# Patient Record
Sex: Male | Born: 1935 | Race: White | Hispanic: No | Smoking: Never smoker
Health system: Southern US, Community
[De-identification: ages and names within clinical notes are randomized; demographics above are authoritative.]

## PROBLEM LIST (undated history)

## (undated) DIAGNOSIS — F329 Major depressive disorder, single episode, unspecified: Secondary | ICD-10-CM

## (undated) DIAGNOSIS — I2699 Other pulmonary embolism without acute cor pulmonale: Secondary | ICD-10-CM

## (undated) DIAGNOSIS — G2581 Restless legs syndrome: Secondary | ICD-10-CM

## (undated) DIAGNOSIS — N400 Enlarged prostate without lower urinary tract symptoms: Secondary | ICD-10-CM

## (undated) DIAGNOSIS — G4733 Obstructive sleep apnea (adult) (pediatric): Secondary | ICD-10-CM

## (undated) DIAGNOSIS — S060X9A Concussion with loss of consciousness of unspecified duration, initial encounter: Secondary | ICD-10-CM

## (undated) DIAGNOSIS — F419 Anxiety disorder, unspecified: Secondary | ICD-10-CM

## (undated) DIAGNOSIS — K5792 Diverticulitis of intestine, part unspecified, without perforation or abscess without bleeding: Secondary | ICD-10-CM

## (undated) DIAGNOSIS — K219 Gastro-esophageal reflux disease without esophagitis: Secondary | ICD-10-CM

## (undated) DIAGNOSIS — R339 Retention of urine, unspecified: Secondary | ICD-10-CM

## (undated) DIAGNOSIS — J40 Bronchitis, not specified as acute or chronic: Secondary | ICD-10-CM

## (undated) DIAGNOSIS — I10 Essential (primary) hypertension: Secondary | ICD-10-CM

## (undated) DIAGNOSIS — N289 Disorder of kidney and ureter, unspecified: Secondary | ICD-10-CM

## (undated) DIAGNOSIS — G4730 Sleep apnea, unspecified: Secondary | ICD-10-CM

## (undated) DIAGNOSIS — F411 Generalized anxiety disorder: Secondary | ICD-10-CM

## (undated) DIAGNOSIS — J189 Pneumonia, unspecified organism: Secondary | ICD-10-CM

## (undated) HISTORY — PX: CERVICAL FUSION: SHX112

## (undated) HISTORY — DX: Major depressive disorder, single episode, unspecified: F32.9

## (undated) HISTORY — PX: TONSILLECTOMY: SUR1361

## (undated) HISTORY — PX: KNEE ARTHROSCOPY: SHX127

## (undated) HISTORY — PX: BICEPS TENDON REPAIR: SHX566

## (undated) HISTORY — DX: Anxiety disorder, unspecified: F41.9

## (undated) HISTORY — PX: ROTATOR CUFF REPAIR: SHX139

## (undated) HISTORY — DX: Other pulmonary embolism without acute cor pulmonale: I26.99

## (undated) HISTORY — DX: Obstructive sleep apnea (adult) (pediatric): G47.33

## (undated) HISTORY — PX: INGUINAL HERNIA REPAIR: SUR1180

## (undated) HISTORY — PX: CARPAL TUNNEL RELEASE: SHX101

## (undated) HISTORY — DX: Generalized anxiety disorder: F41.1

## (undated) HISTORY — DX: Restless legs syndrome: G25.81

## (undated) HISTORY — PX: EAR MASTOIDECTOMY W/ COCHLEAR IMPLANT W/ LANDMARK: SHX1483

## (undated) HISTORY — PX: CHOLECYSTECTOMY: SHX55

## (undated) HISTORY — PX: OTHER SURGICAL HISTORY: SHX169

---
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
## 1983-08-20 HISTORY — PX: NASAL FRACTURE SURGERY: SHX718

## 1990-08-19 HISTORY — PX: CARPAL TUNNEL RELEASE: SHX101

## 1995-08-20 HISTORY — PX: INGUINAL HERNIA REPAIR: SUR1180

## 2004-02-17 HISTORY — PX: KNEE ARTHROSCOPY: SHX127

## 2010-07-19 HISTORY — PX: BICEPS TENDON REPAIR: SHX566

## 2011-02-17 HISTORY — PX: ROTATOR CUFF REPAIR: SHX139

## 2013-05-21 DIAGNOSIS — H04123 Dry eye syndrome of bilateral lacrimal glands: Secondary | ICD-10-CM | POA: Insufficient documentation

## 2015-07-14 DIAGNOSIS — S060X9A Concussion with loss of consciousness of unspecified duration, initial encounter: Secondary | ICD-10-CM

## 2015-07-14 DIAGNOSIS — S060XAA Concussion with loss of consciousness status unknown, initial encounter: Secondary | ICD-10-CM

## 2015-07-14 HISTORY — DX: Concussion with loss of consciousness of unspecified duration, initial encounter: S06.0X9A

## 2015-07-14 HISTORY — DX: Concussion with loss of consciousness status unknown, initial encounter: S06.0XAA

## 2015-11-12 ENCOUNTER — Emergency Department (HOSPITAL_BASED_OUTPATIENT_CLINIC_OR_DEPARTMENT_OTHER): Payer: Medicare Other

## 2015-11-12 ENCOUNTER — Emergency Department (HOSPITAL_BASED_OUTPATIENT_CLINIC_OR_DEPARTMENT_OTHER)
Admission: EM | Admit: 2015-11-12 | Discharge: 2015-11-12 | Disposition: A | Discharge status: Home / Self Care | Payer: Medicare Other | Attending: Emergency Medicine | Admitting: Emergency Medicine

## 2015-11-12 ENCOUNTER — Encounter (HOSPITAL_BASED_OUTPATIENT_CLINIC_OR_DEPARTMENT_OTHER): Payer: Self-pay | Admitting: *Deleted

## 2015-11-12 DIAGNOSIS — S6991XA Unspecified injury of right wrist, hand and finger(s), initial encounter: Secondary | ICD-10-CM | POA: Insufficient documentation

## 2015-11-12 DIAGNOSIS — Z8709 Personal history of other diseases of the respiratory system: Secondary | ICD-10-CM | POA: Insufficient documentation

## 2015-11-12 DIAGNOSIS — Y998 Other external cause status: Secondary | ICD-10-CM | POA: Insufficient documentation

## 2015-11-12 DIAGNOSIS — S80212A Abrasion, left knee, initial encounter: Secondary | ICD-10-CM | POA: Insufficient documentation

## 2015-11-12 DIAGNOSIS — Z86711 Personal history of pulmonary embolism: Secondary | ICD-10-CM | POA: Insufficient documentation

## 2015-11-12 DIAGNOSIS — Y93K1 Activity, walking an animal: Secondary | ICD-10-CM | POA: Insufficient documentation

## 2015-11-12 DIAGNOSIS — Z7901 Long term (current) use of anticoagulants: Secondary | ICD-10-CM | POA: Insufficient documentation

## 2015-11-12 DIAGNOSIS — Z87448 Personal history of other diseases of urinary system: Secondary | ICD-10-CM | POA: Insufficient documentation

## 2015-11-12 DIAGNOSIS — K219 Gastro-esophageal reflux disease without esophagitis: Secondary | ICD-10-CM | POA: Insufficient documentation

## 2015-11-12 DIAGNOSIS — Z87891 Personal history of nicotine dependence: Secondary | ICD-10-CM | POA: Insufficient documentation

## 2015-11-12 DIAGNOSIS — W19XXXA Unspecified fall, initial encounter: Secondary | ICD-10-CM

## 2015-11-12 DIAGNOSIS — S80211A Abrasion, right knee, initial encounter: Secondary | ICD-10-CM | POA: Diagnosis not present

## 2015-11-12 DIAGNOSIS — S299XXA Unspecified injury of thorax, initial encounter: Secondary | ICD-10-CM | POA: Diagnosis not present

## 2015-11-12 DIAGNOSIS — Z79899 Other long term (current) drug therapy: Secondary | ICD-10-CM | POA: Insufficient documentation

## 2015-11-12 DIAGNOSIS — W010XXA Fall on same level from slipping, tripping and stumbling without subsequent striking against object, initial encounter: Secondary | ICD-10-CM | POA: Diagnosis not present

## 2015-11-12 DIAGNOSIS — Z8701 Personal history of pneumonia (recurrent): Secondary | ICD-10-CM | POA: Insufficient documentation

## 2015-11-12 DIAGNOSIS — I10 Essential (primary) hypertension: Secondary | ICD-10-CM | POA: Diagnosis not present

## 2015-11-12 DIAGNOSIS — Y9289 Other specified places as the place of occurrence of the external cause: Secondary | ICD-10-CM | POA: Insufficient documentation

## 2015-11-12 HISTORY — DX: Gastro-esophageal reflux disease without esophagitis: K21.9

## 2015-11-12 HISTORY — DX: Diverticulitis of intestine, part unspecified, without perforation or abscess without bleeding: K57.92

## 2015-11-12 HISTORY — DX: Essential (primary) hypertension: I10

## 2015-11-12 HISTORY — DX: Sleep apnea, unspecified: G47.30

## 2015-11-12 HISTORY — DX: Bronchitis, not specified as acute or chronic: J40

## 2015-11-12 HISTORY — DX: Disorder of kidney and ureter, unspecified: N28.9

## 2015-11-12 HISTORY — DX: Pneumonia, unspecified organism: J18.9

## 2015-11-12 HISTORY — DX: Other pulmonary embolism without acute cor pulmonale: I26.99

## 2015-11-12 HISTORY — DX: Concussion with loss of consciousness of unspecified duration, initial encounter: S06.0X9A

## 2015-11-12 NOTE — ED Notes (Signed)
States fell yesterday, onto asphalt , states landed on knees and then onto hands/wrist, presents with rt wrist pain, rt wrist noted to have slight swelling, arrived with ice to injury site, CMS WNL of RUE
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
## 2015-11-12 NOTE — ED Notes (Signed)
Pt refused hospital gown. Instructed to keep ice on wrist, with elevation as well.

## 2015-11-12 NOTE — ED Notes (Signed)
Ice pack and comfort measures provided

## 2015-11-12 NOTE — Discharge Instructions (Signed)
Wear the splint as needed for comfort. It is okay to remove it for bathing or other activities. Take your hydrocodone as needed for bad pain or Tylenol for mild pain. Don't take Tylenol together with hydrocodone as the combination can be dangerous to your liver. Call Dr. Carlos LeveringGramig's office tomorrow to schedule an office visit for within the next one or 2 weeks. Tell office staff that you had x-rays performed here.

## 2015-11-12 NOTE — ED Notes (Signed)
Medications reviewed with pt and family, all medications returned back to patient.

## 2015-11-12 NOTE — ED Notes (Signed)
Patient transported to X-ray 

## 2015-11-12 NOTE — ED Notes (Signed)
PMS intact before and after. Pt tolerated well. Pt has no questions.

## 2015-11-14 ENCOUNTER — Ambulatory Visit (INDEPENDENT_AMBULATORY_CARE_PROVIDER_SITE_OTHER): Payer: Medicare Other | Admitting: Neurology

## 2015-11-14 ENCOUNTER — Encounter: Payer: Self-pay | Admitting: Neurology

## 2015-11-14 VITALS — BP 128/82 | HR 67 | Resp 16 | Ht 70.0 in | Wt 192.0 lb

## 2015-11-14 DIAGNOSIS — R296 Repeated falls: Secondary | ICD-10-CM

## 2015-11-14 DIAGNOSIS — R9402 Abnormal brain scan: Secondary | ICD-10-CM

## 2015-11-14 DIAGNOSIS — G459 Transient cerebral ischemic attack, unspecified: Secondary | ICD-10-CM

## 2015-11-14 DIAGNOSIS — R413 Other amnesia: Secondary | ICD-10-CM | POA: Diagnosis not present

## 2015-11-14 DIAGNOSIS — G609 Hereditary and idiopathic neuropathy, unspecified: Secondary | ICD-10-CM

## 2015-11-14 HISTORY — DX: Hereditary and idiopathic neuropathy, unspecified: G60.9
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
## 2015-11-14 NOTE — Patient Instructions (Signed)
1. Schedule MRI brain and MRA head without contrast 2. Refer to Physical Therapy for frequent falls 3. Physical exercise and brain stimulation exercises are important for brain health 4. Follow-up in 6 months, call for any changes

## 2015-11-15 ENCOUNTER — Telehealth: Payer: Self-pay | Admitting: Family Medicine

## 2015-11-15 NOTE — Telephone Encounter (Signed)
Spoke with patient to give him MRI & MRA appt information. [facility] is [facility]/15 for 3:00 pm. Needs to arrive @ 2:45 pm. Explained that he needs to go the main entrance of the hospital radiology is on the 1st floor. Also told patient before scan he is going to have to remove the external portion of his cochlear implant.   All pertinent implant information is noted on the orders.

## 2015-11-24 ENCOUNTER — Ambulatory Visit: Payer: Medicare Other | Attending: Neurology

## 2015-11-24 ENCOUNTER — Ambulatory Visit (HOSPITAL_COMMUNITY)
Admission: RE | Admit: 2015-11-24 | Discharge: 2015-11-24 | Disposition: A | Discharge status: Home / Self Care | Payer: Medicare Other | Source: Ambulatory Visit | Attending: Neurology | Admitting: Neurology

## 2015-11-24 ENCOUNTER — Ambulatory Visit (HOSPITAL_COMMUNITY): Payer: Medicare Other

## 2015-11-24 DIAGNOSIS — R2689 Other abnormalities of gait and mobility: Secondary | ICD-10-CM | POA: Diagnosis not present
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
## 2015-11-28 ENCOUNTER — Ambulatory Visit: Payer: Medicare Other

## 2015-11-28 ENCOUNTER — Ambulatory Visit (HOSPITAL_COMMUNITY)
Admission: RE | Admit: 2015-11-28 | Discharge: 2015-11-28 | Disposition: A | Discharge status: Home / Self Care | Payer: Medicare Other | Source: Ambulatory Visit | Attending: Neurology | Admitting: Neurology

## 2015-11-28 ENCOUNTER — Ambulatory Visit: Payer: Medicare Other | Admitting: Physical Therapy

## 2015-11-28 DIAGNOSIS — R296 Repeated falls: Secondary | ICD-10-CM | POA: Diagnosis not present

## 2015-11-28 DIAGNOSIS — R9402 Abnormal brain scan: Secondary | ICD-10-CM | POA: Diagnosis present

## 2015-11-28 DIAGNOSIS — R413 Other amnesia: Secondary | ICD-10-CM | POA: Insufficient documentation

## 2015-11-28 DIAGNOSIS — G459 Transient cerebral ischemic attack, unspecified: Secondary | ICD-10-CM | POA: Insufficient documentation

## 2015-11-28 DIAGNOSIS — R2689 Other abnormalities of gait and mobility: Secondary | ICD-10-CM

## 2015-11-28 NOTE — Patient Instructions (Signed)
Fall Prevention in the Home  Falls can cause injuries and can affect people from all age groups. There are many simple things that you can do to make your home safe and to help prevent falls. WHAT CAN I DO ON THE OUTSIDE OF MY HOME?  Regularly repair the edges of walkways and driveways and fix any cracks.  Remove high doorway thresholds.  Trim any shrubbery on the main path into your home.  Use bright outdoor lighting.  Clear walkways of debris and clutter, including tools and rocks.  Regularly check that handrails are securely fastened and in good repair. Both sides of any steps should have handrails.  Install guardrails along the edges of any raised decks or porches.  Have leaves, snow, and ice cleared regularly.  Use sand or salt on walkways during winter months.  In the garage, clean up any spills right away, including grease or oil spills. WHAT CAN I DO IN THE BATHROOM?  Use night lights.  Install grab bars by the toilet and in the tub and shower. Do not use towel bars as grab bars.  Use non-skid mats or decals on the floor of the tub or shower.  If you need to sit down while you are in the shower, use a plastic, non-slip stool..  Keep the floor dry. Immediately clean up any water that spills on the floor.  Remove soap buildup in the tub or shower on a regular basis.  Attach bath mats securely with double-sided non-slip rug tape.  Remove throw rugs and other tripping hazards from the floor. WHAT CAN I DO IN THE BEDROOM?  Use night lights.  Make sure that a bedside light is easy to reach.  Do not use oversized bedding that drapes onto the floor.  Have a firm chair that has side arms to use for getting dressed.  Remove throw rugs and other tripping hazards from the floor. WHAT CAN I DO IN THE KITCHEN?   Clean up any spills right away.  Avoid walking on wet floors.  Place frequently used items in easy-to-reach places.  If you need to reach for something  above you, use a sturdy step stool that has a grab bar.  Keep electrical cables out of the way.  Do not use floor polish or wax that makes floors slippery. If you have to use wax, make sure that it is non-skid floor wax.  Remove throw rugs and other tripping hazards from the floor. WHAT CAN I DO IN THE STAIRWAYS?  Do not leave any items on the stairs.  Make sure that there are handrails on both sides of the stairs. Fix handrails that are broken or loose. Make sure that handrails are as long as the stairways.  Check any carpeting to make sure that it is firmly attached to the stairs. Fix any carpet that is loose or worn.  Avoid having throw rugs at the top or bottom of stairways, or secure the rugs with carpet tape to prevent them from moving.  Make sure that you have a light switch at the top of the stairs and the bottom of the stairs. If you do not have them, have them installed. WHAT ARE SOME OTHER FALL PREVENTION TIPS?  Wear closed-toe shoes that fit well and support your feet. Wear shoes that have rubber soles or low heels.  When you use a stepladder, make sure that it is completely opened and that the sides are firmly locked. Have someone hold the ladder while you   are using it. Do not climb a closed stepladder.  Add color or contrast paint or tape to grab bars and handrails in your home. Place contrasting color strips on the first and last steps.  Use mobility aids as needed, such as canes, walkers, scooters, and crutches.  Turn on lights if it is dark. Replace any light bulbs that burn out.  Set up furniture so that there are clear paths. Keep the furniture in the same spot.  Fix any uneven floor surfaces.  Choose a carpet design that does not hide the edge of steps of a stairway.  Be aware of any and all pets.  Review your medicines with your healthcare provider. Some medicines can cause dizziness or changes in blood pressure, which increase your risk of falling. Talk  with your health care provider about other ways that you can decrease your risk of falls. This may include working with a physical therapist or trainer to improve your strength, balance, and endurance.   This information is not intended to replace advice given to you by your health care provider. Make sure you discuss any questions you have with your health care provider.   Document Released: 07/26/2002 Document Revised: 12/20/2014 Document Reviewed: 09/09/2014 Elsevier Interactive Patient Education 2016 [employer] Inc[employer]  
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
## 2015-12-05 ENCOUNTER — Telehealth: Payer: Self-pay | Admitting: Family Medicine

## 2015-12-05 NOTE — Telephone Encounter (Signed)
-----   Message from Van ClinesKaren M Aquino, MD sent at 12/04/2015  3:05 PM EDT ----- Pls let patient/daughter know I reviewed MRI brain and it is overall unremarkable, no evidence of tumor, stroke, or bleed. The blood vessels in the brain are within normal. Thanks

## 2015-12-05 NOTE — Telephone Encounter (Signed)
Patient was notified or results.

## 2015-12-06 ENCOUNTER — Ambulatory Visit: Payer: Medicare Other | Admitting: Rehabilitative and Restorative Service Providers"

## 2015-12-06 DIAGNOSIS — R2689 Other abnormalities of gait and mobility: Secondary | ICD-10-CM | POA: Diagnosis not present
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
## 2015-12-14 ENCOUNTER — Ambulatory Visit: Payer: Medicare Other

## 2015-12-14 DIAGNOSIS — R2689 Other abnormalities of gait and mobility: Secondary | ICD-10-CM
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
## 2015-12-15 ENCOUNTER — Ambulatory Visit: Payer: Medicare Other

## 2015-12-15 VITALS — BP 140/68 | HR 57

## 2015-12-15 DIAGNOSIS — R2689 Other abnormalities of gait and mobility: Secondary | ICD-10-CM

## 2015-12-15 NOTE — Patient Instructions (Signed)
FUNCTIONAL MOBILITY: Wall Squat    Stance: shoulder-width on floor, against wall. Place feet in front of hips. Bend hips and knees. Keep back straight. Do not allow knees to bend past toes. Hold for 10 seconds. Squeeze glutes and quads to stand. _5__ reps per set, _2__ sets per day, _3__ days per week  Copyright  VHI. All rights reserved.    SidesteppingBand Walk: Side Stepping    Holding counter as needed. Tie band around legs, just above knees. Step _10__ feet to one side, then step back to start. Repeat _twice__  session. Note: Small towel between band and skin eases rubbing.  http://plyo.exer.us/76   Copyright  VHI. All rights reserved.
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
## 2015-12-19 ENCOUNTER — Ambulatory Visit: Payer: Medicare Other | Attending: Neurology

## 2015-12-19 DIAGNOSIS — R2689 Other abnormalities of gait and mobility: Secondary | ICD-10-CM | POA: Diagnosis present
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
## 2015-12-21 ENCOUNTER — Ambulatory Visit: Payer: Medicare Other

## 2015-12-21 DIAGNOSIS — R2689 Other abnormalities of gait and mobility: Secondary | ICD-10-CM | POA: Diagnosis not present
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
## 2016-05-02 ENCOUNTER — Encounter: Payer: Self-pay | Admitting: Neurology

## 2016-05-02 ENCOUNTER — Ambulatory Visit (INDEPENDENT_AMBULATORY_CARE_PROVIDER_SITE_OTHER): Payer: Medicare Other | Admitting: Neurology

## 2016-05-02 VITALS — BP 144/78 | HR 71 | Temp 97.8°F | Ht 70.0 in | Wt 181.5 lb

## 2016-05-02 DIAGNOSIS — R413 Other amnesia: Secondary | ICD-10-CM | POA: Diagnosis not present

## 2016-05-02 DIAGNOSIS — G609 Hereditary and idiopathic neuropathy, unspecified: Secondary | ICD-10-CM
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
## 2016-05-02 NOTE — Patient Instructions (Signed)
You look great! Continue with physical exercise and brain stimulation exercises for continued brain health. Follow-up in 1 year, call for any changes

## 2016-05-24 ENCOUNTER — Ambulatory Visit (INDEPENDENT_AMBULATORY_CARE_PROVIDER_SITE_OTHER): Payer: Medicare Other | Admitting: Neurology

## 2016-05-24 ENCOUNTER — Encounter: Payer: Self-pay | Admitting: Neurology

## 2016-05-24 ENCOUNTER — Other Ambulatory Visit (INDEPENDENT_AMBULATORY_CARE_PROVIDER_SITE_OTHER): Payer: Medicare Other

## 2016-05-24 VITALS — BP 148/76 | HR 71 | Temp 98.1°F | Ht 70.0 in | Wt 180.1 lb

## 2016-05-24 DIAGNOSIS — G2581 Restless legs syndrome: Secondary | ICD-10-CM | POA: Diagnosis not present

## 2016-05-24 DIAGNOSIS — F639 Impulse disorder, unspecified: Secondary | ICD-10-CM | POA: Diagnosis not present

## 2016-05-24 DIAGNOSIS — R413 Other amnesia: Secondary | ICD-10-CM

## 2016-05-24 LAB — URINALYSIS
BILIRUBIN URINE: NEGATIVE
KETONES UR: NEGATIVE
LEUKOCYTES UA: NEGATIVE
Nitrite: NEGATIVE
Specific Gravity, Urine: <=1.005 — AB (ref 1.000–1.030)
Total Protein, Urine: NEGATIVE
UROBILINOGEN UA: 0.2 (ref 0.0–1.0)
Urine Glucose: NEGATIVE
pH: 6 (ref 5.0–8.0)

## 2016-05-24 LAB — CBC WITH DIFFERENTIAL/PLATELET
BASOS ABS: 0 10*3/uL (ref 0.0–0.1)
Basophils Relative: 0.6 % (ref 0.0–3.0)
EOS PCT: 2.2 % (ref 0.0–5.0)
Eosinophils Absolute: 0.1 10*3/uL (ref 0.0–0.7)
HEMATOCRIT: 40.4 % (ref 39.0–52.0)
Hemoglobin: 13.9 g/dL (ref 13.0–17.0)
LYMPHS PCT: 24.6 % (ref 12.0–46.0)
Lymphs Abs: 1.4 10*3/uL (ref 0.7–4.0)
MCHC: 34.4 g/dL (ref 30.0–36.0)
MCV: 90.4 fl (ref 78.0–100.0)
MONOS PCT: 6.8 % (ref 3.0–12.0)
Monocytes Absolute: 0.4 10*3/uL (ref 0.1–1.0)
NEUTROS ABS: 3.9 10*3/uL (ref 1.4–7.7)
Neutrophils Relative %: 65.8 % (ref 43.0–77.0)
PLATELETS: 231 10*3/uL (ref 150.0–400.0)
RBC: 4.47 Mil/uL (ref 4.22–5.81)
RDW: 13.5 % (ref 11.5–15.5)
WBC: 5.9 10*3/uL (ref 4.0–10.5)

## 2016-05-24 LAB — COMPREHENSIVE METABOLIC PANEL
ALK PHOS: 68 U/L (ref 39–117)
ALT: 16 U/L (ref 0–53)
AST: 20 U/L (ref 0–37)
Albumin: 4.1 g/dL (ref 3.5–5.2)
BILIRUBIN TOTAL: 0.6 mg/dL (ref 0.2–1.2)
BUN: 19 mg/dL (ref 6–23)
CALCIUM: 9.1 mg/dL (ref 8.4–10.5)
CO2: 27 meq/L (ref 19–32)
Chloride: 104 mEq/L (ref 96–112)
Creatinine, Ser: 1.36 mg/dL (ref 0.40–1.50)
GFR: 53.5 mL/min — AB (ref >60.00)
Glucose, Bld: 119 mg/dL — ABNORMAL HIGH (ref 70–99)
POTASSIUM: 3.7 meq/L (ref 3.5–5.1)
Sodium: 138 mEq/L (ref 135–145)
TOTAL PROTEIN: 6.3 g/dL (ref 6.0–8.3)
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
## 2016-05-24 NOTE — Patient Instructions (Addendum)
1. Reduce Pramipexole to 1/2 tablet at night 2. If your restless leg symptoms worsen, instead of taking 1/2 tablet of clonazepam, may take an addition 1/4 tablet with the 1/2 tablet you regularly take 3. Lab tests for: CBC, CMP, urinalysis 4. Schedule Neuropsychological evaluation with Dr. Alinda DoomsBailar 5. Follow-up in 2 months

## 2016-05-29 ENCOUNTER — Telehealth: Payer: Self-pay

## 2016-05-29 ENCOUNTER — Encounter: Payer: Self-pay | Admitting: Neurology

## 2016-05-29 ENCOUNTER — Other Ambulatory Visit: Payer: Self-pay

## 2016-05-29 ENCOUNTER — Telehealth: Payer: Self-pay | Admitting: Neurology

## 2016-05-29 DIAGNOSIS — S069XAA Unspecified intracranial injury with loss of consciousness status unknown, initial encounter: Secondary | ICD-10-CM

## 2016-05-29 DIAGNOSIS — R413 Other amnesia: Secondary | ICD-10-CM

## 2016-05-29 DIAGNOSIS — S069X9A Unspecified intracranial injury with loss of consciousness of unspecified duration, initial encounter: Secondary | ICD-10-CM

## 2016-05-29 HISTORY — DX: Unspecified intracranial injury with loss of consciousness of unspecified duration, initial encounter: S06.9X9A

## 2016-05-29 HISTORY — DX: Unspecified intracranial injury with loss of consciousness status unknown, initial encounter: S06.9XAA

## 2016-05-29 NOTE — Telephone Encounter (Signed)
Pls call daughter and let her know that the email is certainly unusual, and would not be due to anxiety. Would do a head CT without contrast (pls do today or tomorrow). I also wonder if he is taking his medications as instructed and not taking more of what he needs to take. At this point I would recommend he have in-home care to help him with medications. If she needs information, pls give her the contact information on the pamphlets we have for home health. Thanks   Notified daughter. She would like to know what CT will be done for. Advised patient's daughter it will be ordered to check for bleed.   Daughter Samuel Sloan also asked I order CT in [place]. I was able to schedule CT ON 10/12 at [facility] at [address]8:00 Am, [address], [place], [place] [id number]. [phone] contact for any changes.

## 2016-05-29 NOTE — Telephone Encounter (Signed)
Spoke to daughter, she had sent an email earlier today that her father had sent her which was gibberish. Recommended doing a head CT to rule out bleed since is on anticoagulation. His daughter is understandably very worried, she could not contact him earlier, but now is at his house and he is okay, walking very slowly, speech has been more and more slurred, worse some times than others. He reports having a worse headache than he has had in ages. Instructed to go to the ER. His daughter is worried what will happen if scan is normal, discussed that with his altered mental status and inability to stay home by himself, he likely merits inpatient admission. Will f/u with daughter before the end of the day.

## 2016-05-29 NOTE — Telephone Encounter (Signed)
Spoke to daughter, patient will be transferred to [facility].

## 2016-05-29 NOTE — Telephone Encounter (Signed)
Spoke to daughter, he is in ER getting an EEG. Called ER physician Dr. Celine Mansesai at [facility] [phone]), discussed case with him and reason for sending to ER. As we were speaking, imaging report relayed to him that patient has a large subdural hematoma. He will be transferred for neurosurgical care.

## 2016-05-29 NOTE — Telephone Encounter (Incomplete)
Spoke to daughter, he is in ER getting an EEG. Called ER physician Dr. Celine Mansesai at [facility] [phone]),

## 2016-05-31 ENCOUNTER — Telehealth: Payer: Self-pay

## 2016-05-31 NOTE — Telephone Encounter (Signed)
Patient's daughter Corrie DandyMary called [facility] 10/12. Initial Comment: Caller states father is in the hospital and would like to have Dr. Karel JarvisAquino to give her a call.

## 2016-05-31 NOTE — Telephone Encounter (Signed)
Spoke to daughter. He had bad headache today, repeat scan looks okay, will be moving to a regular room. She met with Education officer, museum, there is no choice until he goes broke before he can get medicaid. She would like to speak to our social worker, [facility] Better Kidd's number was given to daughter.

## 2016-05-31 NOTE — Telephone Encounter (Signed)
Tried calling again, no answer, unable to leave VM.

## 2016-05-31 NOTE — Telephone Encounter (Signed)
Called daughter, mailbox if full and cannot accept messages.

## 2016-06-05 HISTORY — PX: IVC FILTER INSERTION: CATH118245

## 2016-06-10 ENCOUNTER — Telehealth: Payer: Self-pay

## 2016-06-10 NOTE — Telephone Encounter (Signed)
Thanks

## 2016-06-10 NOTE — Telephone Encounter (Signed)
LVM for daughter to return call. Let her know we were just checking to see how he was doing.   Also spoke with patient he states he is taking rehab and doing a lot better.

## 2016-06-10 NOTE — Telephone Encounter (Signed)
Patient's daughter called back and states father is going to be in skilled facility for about 2 weeks and then they are going to pay someone to be with him about 3 hours a day. She feels his memory is still bad.

## 2016-06-13 ENCOUNTER — Telehealth: Payer: Self-pay | Admitting: Neurology

## 2016-06-13 NOTE — Telephone Encounter (Signed)
Please advise 

## 2016-06-13 NOTE — Telephone Encounter (Signed)
Samuel FlaxJames Giorgio 29-Jul-2034. His Daughter called needing to speak with you or Dr. Karel JarvisAquino regarding her father. Her # is [phone]. She said he is scheduled for Neuro testing on 06/24/16 and she wanted to know if Dr. Karel JarvisAquino thought she could get a base line off of that or should it be delayed?  She said he is in a nursing facility due to a brain bleed. Thank you

## 2016-06-17 NOTE — Telephone Encounter (Signed)
Patient.s daughter notified of below.

## 2016-06-17 NOTE — Telephone Encounter (Signed)
Pls let her know I spoke to Dr. Alinda DoomsBailar who will be doing the testing, and she said that if there is a question of whether he is able to return to living independently and discharge is anticipated in the near future, then keeping the 11/6 date is good. If he does very poorly, she can always see him back in a month or two just for re-evaluation to see if there have been improvements. Thanks

## 2016-06-24 DIAGNOSIS — R5383 Other fatigue: Secondary | ICD-10-CM | POA: Insufficient documentation

## 2016-06-24 DIAGNOSIS — R400 Somnolence: Secondary | ICD-10-CM | POA: Insufficient documentation

## 2016-06-24 DIAGNOSIS — Z9989 Dependence on other enabling machines and devices: Secondary | ICD-10-CM | POA: Insufficient documentation

## 2016-06-25 ENCOUNTER — Ambulatory Visit (INDEPENDENT_AMBULATORY_CARE_PROVIDER_SITE_OTHER): Payer: Medicare Other | Admitting: Psychology

## 2016-06-25 ENCOUNTER — Encounter: Payer: Self-pay | Admitting: Psychology

## 2016-06-25 DIAGNOSIS — R413 Other amnesia: Secondary | ICD-10-CM

## 2016-06-25 DIAGNOSIS — I6200 Nontraumatic subdural hemorrhage, unspecified: Secondary | ICD-10-CM

## 2016-06-25 DIAGNOSIS — S065X9A Traumatic subdural hemorrhage with loss of consciousness of unspecified duration, initial encounter: Secondary | ICD-10-CM

## 2016-06-25 DIAGNOSIS — S065XAA Traumatic subdural hemorrhage with loss of consciousness status unknown, initial encounter: Secondary | ICD-10-CM
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
## 2016-07-01 ENCOUNTER — Ambulatory Visit (INDEPENDENT_AMBULATORY_CARE_PROVIDER_SITE_OTHER): Payer: Medicare Other | Admitting: Psychology

## 2016-07-01 DIAGNOSIS — G3184 Mild cognitive impairment, so stated: Secondary | ICD-10-CM | POA: Insufficient documentation

## 2016-07-01 DIAGNOSIS — I6200 Nontraumatic subdural hemorrhage, unspecified: Secondary | ICD-10-CM

## 2016-07-01 DIAGNOSIS — S065X9A Traumatic subdural hemorrhage with loss of consciousness of unspecified duration, initial encounter: Secondary | ICD-10-CM

## 2016-07-01 DIAGNOSIS — R413 Other amnesia: Secondary | ICD-10-CM

## 2016-07-01 DIAGNOSIS — S065XAA Traumatic subdural hemorrhage with loss of consciousness status unknown, initial encounter: Secondary | ICD-10-CM

## 2016-07-01 HISTORY — DX: Mild cognitive impairment of uncertain or unknown etiology: G31.84

## 2016-07-01 NOTE — Progress Notes (Signed)
   Neuropsychology Note  Samuel FlaxJames Scharrer returned today for 2 hours of neuropsychological testing with technician, Wallace Kellerana Raunel Dimartino, BS, under the supervision of Dr. Elvis CoilMaryBeth Bailar. The patient did not appear overtly distressed by the testing session, per behavioral observation or via self-report to the technician. Rest breaks were offered. Samuel Sloan will return within 2 weeks for a feedback session with Dr. Alinda DoomsBailar at which time his test performances, clinical impressions and treatment recommendations will be reviewed in detail. The patient understands he can contact our office should he require our assistance before this time.  Full report to follow.
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
## 2016-07-16 ENCOUNTER — Encounter: Payer: Self-pay | Admitting: Psychology

## 2016-07-16 ENCOUNTER — Ambulatory Visit (INDEPENDENT_AMBULATORY_CARE_PROVIDER_SITE_OTHER): Payer: Medicare Other | Admitting: Psychology

## 2016-07-16 DIAGNOSIS — I6200 Nontraumatic subdural hemorrhage, unspecified: Secondary | ICD-10-CM | POA: Diagnosis not present

## 2016-07-16 DIAGNOSIS — S065X9A Traumatic subdural hemorrhage with loss of consciousness of unspecified duration, initial encounter: Secondary | ICD-10-CM

## 2016-07-16 DIAGNOSIS — S065XAA Traumatic subdural hemorrhage with loss of consciousness status unknown, initial encounter: Secondary | ICD-10-CM

## 2016-07-16 DIAGNOSIS — G3184 Mild cognitive impairment, so stated: Secondary | ICD-10-CM

## 2016-07-16 DIAGNOSIS — F909 Attention-deficit hyperactivity disorder, unspecified type: Secondary | ICD-10-CM

## 2016-07-16 DIAGNOSIS — R413 Other amnesia: Secondary | ICD-10-CM

## 2016-09-03 ENCOUNTER — Encounter: Payer: Self-pay | Admitting: Neurology

## 2016-09-03 ENCOUNTER — Ambulatory Visit (INDEPENDENT_AMBULATORY_CARE_PROVIDER_SITE_OTHER): Payer: Medicare Other | Admitting: Neurology

## 2016-09-03 VITALS — BP 146/82 | HR 66 | Ht 70.0 in | Wt 180.5 lb

## 2016-09-03 DIAGNOSIS — G2581 Restless legs syndrome: Secondary | ICD-10-CM | POA: Diagnosis not present

## 2016-09-03 DIAGNOSIS — G3184 Mild cognitive impairment, so stated: Secondary | ICD-10-CM
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
## 2016-09-03 NOTE — Patient Instructions (Signed)
1. Try reducing clonazepam to 1/2 tablet at night 2. Continue all your other medications 3. Continue with physical exercise and brain stimulation exercises (crossword puzzles, word search, etc) for brain health 4. If Ritalin was helping, ok to restart with your PCP 5. Follow-up in 6 months

## 2016-09-11 DIAGNOSIS — G2581 Restless legs syndrome: Secondary | ICD-10-CM | POA: Insufficient documentation

## 2016-09-11 DIAGNOSIS — G3184 Mild cognitive impairment, so stated: Secondary | ICD-10-CM | POA: Insufficient documentation

## 2016-09-11 HISTORY — DX: Restless legs syndrome: G25.81

## 2017-03-03 ENCOUNTER — Ambulatory Visit: Payer: Medicare Other | Admitting: Neurology

## 2017-05-02 ENCOUNTER — Ambulatory Visit: Payer: Medicare Other | Admitting: Neurology

## 2018-05-04 ENCOUNTER — Encounter (HOSPITAL_COMMUNITY): Payer: Self-pay | Admitting: Emergency Medicine

## 2018-05-04 ENCOUNTER — Emergency Department (HOSPITAL_COMMUNITY): Payer: Medicare HMO

## 2018-05-04 ENCOUNTER — Other Ambulatory Visit: Payer: Self-pay

## 2018-05-04 ENCOUNTER — Emergency Department (HOSPITAL_COMMUNITY)
Admission: EM | Admit: 2018-05-04 | Discharge: 2018-05-05 | Discharge status: Against Medical Advice | Payer: Medicare HMO | Attending: Emergency Medicine | Admitting: Emergency Medicine

## 2018-05-04 DIAGNOSIS — R42 Dizziness and giddiness: Secondary | ICD-10-CM | POA: Insufficient documentation

## 2018-05-04 LAB — CBC WITH DIFFERENTIAL/PLATELET
ABS IMMATURE GRANULOCYTES: 0 10*3/uL (ref 0.0–0.1)
BASOS ABS: 0.1 10*3/uL (ref 0.0–0.1)
Basophils Relative: 1 %
Eosinophils Absolute: 0.2 10*3/uL (ref 0.0–0.7)
Eosinophils Relative: 4 %
HCT: 43.5 % (ref 39.0–52.0)
HEMOGLOBIN: 14.5 g/dL (ref 13.0–17.0)
IMMATURE GRANULOCYTES: 1 %
Lymphocytes Relative: 31 %
Lymphs Abs: 1.9 10*3/uL (ref 0.7–4.0)
MCH: 32 pg (ref 26.0–34.0)
MCHC: 33.3 g/dL (ref 30.0–36.0)
MCV: 96 fL (ref 78.0–100.0)
MONO ABS: 0.5 10*3/uL (ref 0.1–1.0)
Monocytes Relative: 8 %
Neutro Abs: 3.4 10*3/uL (ref 1.7–7.7)
Neutrophils Relative %: 55 %
Platelets: 211 10*3/uL (ref 150–400)
RBC: 4.53 MIL/uL (ref 4.22–5.81)
RDW: 12.8 % (ref 11.5–15.5)
WBC: 6 10*3/uL (ref 4.0–10.5)

## 2018-05-04 LAB — BASIC METABOLIC PANEL
ANION GAP: 9 (ref 5–15)
BUN: 16 mg/dL (ref 8–23)
CHLORIDE: 103 mmol/L (ref 98–111)
CO2: 26 mmol/L (ref 22–32)
Calcium: 9.3 mg/dL (ref 8.9–10.3)
Creatinine, Ser: 1.36 mg/dL — ABNORMAL HIGH (ref 0.61–1.24)
GFR, EST AFRICAN AMERICAN: 54 mL/min — AB (ref >60)
GFR, EST NON AFRICAN AMERICAN: 47 mL/min — AB (ref >60)
Glucose, Bld: 100 mg/dL — ABNORMAL HIGH (ref 70–99)
Potassium: 3.9 mmol/L (ref 3.5–5.1)
SODIUM: 138 mmol/L (ref 135–145)

## 2018-05-04 LAB — I-STAT TROPONIN, ED: TROPONIN I, POC: 0 ng/mL (ref 0.00–0.08)
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
## 2018-05-04 NOTE — ED Triage Notes (Signed)
Pt reports having weakness, dizziness, shortness of breath, slurred speech, confusion with memory problems, and double vision. Pt reports weakness, slurred speech has been going on for a couple years. Pt reports double vision has resolved. Pt reports he had 1 fall recently where he hit his head. Pt reports dizziness started a few days ago. Pt alert, in no acute distress at this time.

## 2018-05-04 NOTE — ED Notes (Signed)
Pt. Stated they are not waiting for "some simple results", and left.

## 2018-09-01 ENCOUNTER — Telehealth: Payer: Self-pay | Admitting: Neurology

## 2018-09-01 NOTE — Telephone Encounter (Signed)
Patient's daughter is calling in wanting to speak with you in regards to a neuro psych testing referral and then possible neurologist ref. She is not wanting to wait until August for an appt. Please call her at [phone]. Thanks!

## 2018-09-01 NOTE — Telephone Encounter (Signed)
[name] returned my call.  States that pt is declining.  Appointment made for February 20 @ 10:30am.  [name] states that pt will most likely need neuropsych testing repeated.  I advised that we will most likely send referral to [facility].  [name] was "flabbergasted" that there is no Neuropsychologist within the [facility] system.  Asks about referral to [facility].  I let her know that we have attempted to refer to [facility], but have been told that their schedules are becoming too bogged down and they are no longer accepting outside referrals.  [name] asks where Dr. Alinda Dooms is, states that she may as well just take her father to her, is she has to travel so far anyway.  I advised that Dr. Alinda Dooms is now is [place] and no longer practicing in the sense of taking patients.

## 2018-09-01 NOTE — Telephone Encounter (Signed)
Returned call to [facility].  No answer.  LMOM asking for return call.

## 2018-09-03 ENCOUNTER — Telehealth: Payer: Self-pay | Admitting: Neurology

## 2018-09-03 NOTE — Telephone Encounter (Signed)
Daughter is calling in about neuropsych testing and ref to [facility]. She would like a ref to Dr.Rodenball at [facility]. Please send this referral in. She has already spoken to the doctor's over there. Thanks!

## 2018-09-03 NOTE — Telephone Encounter (Signed)
I spoke with pt's daughter just 2 days ago.  I do not know who she is referring to in [facility], and there is no reason given for PT with Dr. Ardelle Anton.  Also, pt has not been seen in office since 09/03/2016 - he is scheduled for 10/08/2018 (after much reluctance from daughter) pls advise

## 2018-09-03 NOTE — Telephone Encounter (Signed)
[facility] and [facility] do Neuropsych testing, let us know where she wants [place] to send referral to. Dr. Sudie Bailey is a psychologist who does Neuropsych for rehab, I'm not sure what his wait is. We can send referral as well and she can see who can do the testing earlier. Thanks

## 2018-10-08 ENCOUNTER — Ambulatory Visit: Payer: Medicare HMO | Admitting: Neurology

## 2018-10-08 ENCOUNTER — Encounter: Payer: Self-pay | Admitting: Neurology

## 2018-10-08 VITALS — BP 120/64 | HR 56 | Ht 70.0 in | Wt 185.1 lb

## 2018-10-08 DIAGNOSIS — F419 Anxiety disorder, unspecified: Secondary | ICD-10-CM

## 2018-10-08 DIAGNOSIS — G3184 Mild cognitive impairment, so stated: Secondary | ICD-10-CM

## 2018-10-08 MED ORDER — SERTRALINE HCL 50 MG PO TABS: ORAL_TABLET | ORAL | 11 refills | Status: DC
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
## 2018-10-08 NOTE — Patient Instructions (Signed)
1. Increase sertraline 50mg : Take 2 tablets every night, contact our office in a month, if no improvement in symptoms, we can try a different medication 2. Refer to Sudie Bailey for Neurocognitive testing 3. Agree with seeing the counselor again 4. Follow-up in 6 months, call for any changes   RECOMMENDATIONS FOR ALL PATIENTS WITH MEMORY PROBLEMS: 1. Continue to exercise (Recommend 30 minutes of walking everyday, or 3 hours every week) 2. Increase social interactions - continue going to [place] and enjoy social gatherings with friends and family 3. Eat healthy, avoid fried foods and eat more fruits and vegetables 4. Maintain adequate blood pressure, blood sugar, and blood cholesterol level. Reducing the risk of stroke and cardiovascular disease also helps promoting better memory. 5. Avoid stressful situations. Live a simple life and avoid aggravations. Organize your time and prepare for the next day in anticipation. 6. Sleep well, avoid any interruptions of sleep and avoid any distractions in the bedroom that may interfere with adequate sleep quality 7. Avoid sugar, avoid sweets as there is a strong link between excessive sugar intake, diabetes, and cognitive impairment The Mediterranean diet has been shown to help patients reduce the risk of progressive memory disorders and reduces cardiovascular risk. This includes eating fish, eat fruits and green leafy vegetables, nuts like almonds and hazelnuts, walnuts, and also use olive oil. Avoid fast foods and fried foods as much as possible. Avoid sweets and sugar as sugar use has been linked to worsening of memory function.

## 2018-10-12 ENCOUNTER — Telehealth: Payer: Self-pay | Admitting: Neurology

## 2018-10-12 NOTE — Telephone Encounter (Signed)
Notes faxed to [phone]  Confirmation received

## 2018-10-12 NOTE — Telephone Encounter (Signed)
Patient called and updated his PCP in the System to [facility]. He would like his notes from 10/08/18 sent to [facility]. Thanks

## 2018-10-13 ENCOUNTER — Telehealth: Payer: Self-pay | Admitting: Neurology

## 2018-10-13 NOTE — Telephone Encounter (Signed)
The office called this morning regarding this patient and they only received 7 out of 10 Pages from the fax that was sent. The fax number is [phone]. Please Re fax. Thanks

## 2018-10-14 NOTE — Telephone Encounter (Signed)
Notes re-faxed to Dr. Shanda Bumps office

## 2018-10-22 ENCOUNTER — Encounter: Payer: Self-pay | Admitting: Psychology

## 2018-11-04 ENCOUNTER — Telehealth: Payer: Self-pay | Admitting: Neurology

## 2018-11-04 NOTE — Telephone Encounter (Signed)
Patient called to let Dr. Karel Jarvis know that he is doing well on the Sertraline medication for his Anxiety. Thanks

## 2018-11-04 NOTE — Telephone Encounter (Signed)
Noted. Forwarding to Dr. Aquino as FYI 

## 2018-11-05 NOTE — Telephone Encounter (Signed)
Noted, thanks!

## 2018-12-17 ENCOUNTER — Ambulatory Visit: Payer: Medicare HMO | Admitting: Psychology

## 2019-02-01 ENCOUNTER — Ambulatory Visit: Payer: Medicare HMO | Admitting: Psychology

## 2019-02-18 ENCOUNTER — Telehealth: Payer: Self-pay | Admitting: Psychology

## 2019-02-18 NOTE — Telephone Encounter (Signed)
Patient's daughter would like a call from Dr. Sima Matas, she states she needs help consolidating his appointments due to her health issues and having to bring her father to visits.  I explained to her that Dr. Sima Matas does his initial eval, then possibly 3 more visits after that depending on testing.  She would like for all this to be done in 1 day.  Please call [name] ([email or web address]).

## 2019-02-23 ENCOUNTER — Encounter: Payer: Medicare HMO | Admitting: Psychology

## 2019-05-03 ENCOUNTER — Telehealth: Payer: Self-pay | Admitting: Neurology

## 2019-05-03 NOTE — Telephone Encounter (Addendum)
Patient's daughter, [name], called requesting to reschedule the patient's appointment on 05/19/2019 for a 6 month follow up [place]. She'd like to move it to December or January.   Patient's daughter will be the primary transportation for the patient. Due to her work schedule, she'd like an appointment for her dad on either a Monday, Tuesday, or Thursday between 9-12 AM.  Routing to nurse for schedule review.

## 2019-05-04 NOTE — Telephone Encounter (Signed)
Ok to change appointment to work with daughters schedule.

## 2019-05-19 ENCOUNTER — Ambulatory Visit: Payer: Medicare HMO | Admitting: Neurology

## 2019-05-24 ENCOUNTER — Encounter: Payer: Medicare HMO | Admitting: Psychology

## 2019-05-24 ENCOUNTER — Encounter

## 2019-06-24 HISTORY — PX: CATARACT EXTRACTION, BILATERAL: SHX1313

## 2019-07-26 ENCOUNTER — Telehealth: Payer: Self-pay | Admitting: Neurology

## 2019-07-26 NOTE — Telephone Encounter (Signed)
Patient's daughter called requesting a call back to give an update on the patient's "executive functioning." He has an appointment with Dr. Delice Lesch tomorrow.

## 2019-07-26 NOTE — Telephone Encounter (Signed)
FYI for appointment.Marland KitchenMarland KitchenMarland KitchenPatients daughter says patient gets defensive easily.  Daughter also needs help guiding patient.  Encouragement to follow provider orders.  He has had increased hypersexuality.

## 2019-07-26 NOTE — Telephone Encounter (Signed)
Duplicate information.  Will address at appointment.

## 2019-07-26 NOTE — Telephone Encounter (Signed)
Patient's daughter called regarding many concerns she has regarding Financial Concerns, Not taking medications, Changes, Risk taking, Spending several thousands of dollars on inapropriate things, Anxiety,medication errors, Self care, Being involved in [place]. Daughter would like a referral for a Psychiatrist  For these issues. She needs to have someone fill his medication boxes. And medical opinion regarding driving. Also, to emphasize medical orders to take priority over other things. Please Call her [phone]. Thank you

## 2019-07-27 ENCOUNTER — Other Ambulatory Visit: Payer: Self-pay

## 2019-07-27 ENCOUNTER — Telehealth (INDEPENDENT_AMBULATORY_CARE_PROVIDER_SITE_OTHER): Payer: Medicare HMO | Admitting: Neurology

## 2019-07-27 ENCOUNTER — Encounter: Payer: Self-pay | Admitting: Neurology

## 2019-07-27 VITALS — Ht 70.0 in | Wt 180.0 lb

## 2019-07-27 DIAGNOSIS — G3184 Mild cognitive impairment, so stated: Secondary | ICD-10-CM | POA: Diagnosis not present

## 2019-07-27 DIAGNOSIS — R4689 Other symptoms and signs involving appearance and behavior: Secondary | ICD-10-CM

## 2019-07-27 DIAGNOSIS — R4189 Other symptoms and signs involving cognitive functions and awareness: Secondary | ICD-10-CM | POA: Diagnosis not present

## 2019-07-27 DIAGNOSIS — G2581 Restless legs syndrome: Secondary | ICD-10-CM
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
## 2019-09-06 ENCOUNTER — Other Ambulatory Visit: Payer: Self-pay

## 2019-09-06 ENCOUNTER — Telehealth: Payer: Medicare HMO | Admitting: Neurology

## 2019-09-06 ENCOUNTER — Telehealth: Payer: Self-pay

## 2019-09-06 NOTE — Telephone Encounter (Signed)
Noted, thanks!

## 2019-09-06 NOTE — Telephone Encounter (Signed)
Per daughter [name], Mr. Mccree is looking to move to a independent living facility. [facility]. As of right now pt has to make a decision on where he will be living by the end of January and will be paying out of pocket until the [place] can help financially. Daughter knows that at some point the [place] will require the Neuropsych testing results. She would also like a copy for records. Daughter would like to make Dr. Karel Jarvis aware of pt going into a facility.

## 2019-10-04 ENCOUNTER — Ambulatory Visit: Payer: Medicare HMO | Admitting: Psychology

## 2019-10-04 ENCOUNTER — Encounter: Payer: Self-pay | Admitting: Psychology

## 2019-10-04 ENCOUNTER — Other Ambulatory Visit: Payer: Self-pay

## 2019-10-04 ENCOUNTER — Ambulatory Visit (INDEPENDENT_AMBULATORY_CARE_PROVIDER_SITE_OTHER): Payer: Medicare HMO | Admitting: Psychology

## 2019-10-04 DIAGNOSIS — G3184 Mild cognitive impairment, so stated: Secondary | ICD-10-CM | POA: Diagnosis not present

## 2019-10-04 DIAGNOSIS — K219 Gastro-esophageal reflux disease without esophagitis: Secondary | ICD-10-CM | POA: Insufficient documentation

## 2019-10-04 DIAGNOSIS — R413 Other amnesia: Secondary | ICD-10-CM

## 2019-10-04 DIAGNOSIS — F329 Major depressive disorder, single episode, unspecified: Secondary | ICD-10-CM | POA: Insufficient documentation

## 2019-10-04 DIAGNOSIS — I10 Essential (primary) hypertension: Secondary | ICD-10-CM | POA: Insufficient documentation

## 2019-10-04 DIAGNOSIS — K5792 Diverticulitis of intestine, part unspecified, without perforation or abscess without bleeding: Secondary | ICD-10-CM | POA: Insufficient documentation

## 2019-10-04 DIAGNOSIS — J40 Bronchitis, not specified as acute or chronic: Secondary | ICD-10-CM | POA: Insufficient documentation

## 2019-10-04 DIAGNOSIS — G4733 Obstructive sleep apnea (adult) (pediatric): Secondary | ICD-10-CM | POA: Insufficient documentation

## 2019-10-04 DIAGNOSIS — F411 Generalized anxiety disorder: Secondary | ICD-10-CM | POA: Insufficient documentation

## 2019-10-04 NOTE — Progress Notes (Signed)
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 evaluation are believed to be a valid representation of Mr. Valent current cognitive functioning.  Test Results: Mr. Elza was somewhat disoriented at the time of the current evaluation. He was unable to state his current address (he did recently move to a new location), his phone number, and the current day of the week.  Intellectual abilities based upon educational and vocational attainment were estimated to be in the average range. Premorbid abilities were estimated to be within the average range based upon a single-word reading test.   Processing speed was average. Basic attention was well above average. More complex attention (e.g., working memory) was average to above average. Executive functioning was somewhat variable, ranging from the below average to above average normative ranges. A weakness was exhibited across a task assessing safety responses to various day-to-day scenarios.   Assessed receptive language  abilities were average. Likewise, Mr. Joswick did not exhibit any difficulties comprehending task instructions and answered all questions asked of him appropriately. Assessed expressive language (e.g., verbal fluency and confrontation naming) was variable. Phonemic fluency was well below average to below average, while semantic fluency was average and confrontation naming was above average.     Assessed visuospatial/visuoconstructional abilities were generally within normal limits. Normative weaknesses were exhibited across an isolated visual discrimination task and on his copy of a complex figure; the latter was caused by a very sloppy, rapid approach to figure drawing despite instructions emphasizing attention to detail. Points were lost on his drawing of a clock due to him placing the numbers outside of the clock face, as well as no size differentiation between clock hands.    Learning (i.e., encoding) of novel verbal information was average. Spontaneous delayed recall (i.e., retrieval) of previously learned information was below average to average. Retention rates were 48% across a story learning task, 86% across a list learning task, and 91% across a complex figure drawing task. Performance across recognition tasks was strong, suggesting evidence for information consolidation.   Results of emotional screening instruments suggested that recent symptoms of generalized anxiety were in the mild range, while symptoms of depression were also within the mild range. A screening instrument assessing recent sleep quality suggested the presence of moderate sleep dysfunction.  Tables of Scores:   Note: This summary of test scores accompanies the interpretive report and should not be considered in isolation without reference to the appropriate sections in the text. Descriptors are based on appropriate normative data and may be adjusted based on clinical judgment. The terms impaired and within normal  limits  (WNL) are used when a more specific level of functioning cannot be determined. Descriptors refer to the current evaluation only.         Effort Testing:    [facility]   November 2017 Current    Dot Counting Test: --- --- --- Within Expectation  WAIS-IV Reliable Digit Span: --- --- --- Within Expectation  CVLT-III Forced Choice Recognition: --- --- --- Within Expectation        Orientation:       Raw Score Raw Score Percentile   NAB Orientation, Form 1 --- 22/29 --- ---        Intellectual Functioning:             Standard Score Standard Score Percentile   Test of Premorbid Functioning: 109 108 70 Average        Memory:            Wechsler Memory Scale (WMS-IV):                       Raw Score Raw Score (Scaled Score) Percentile     Logical Memory I 28/53 24/53 (9) 37 Average    Logical Memory II 16/39 10/39 (9) 37 Average    Logical Memory Recognition 17/23 18/23 51-75 Average        [place] Verbal Learning Test (CVLT-III) Brief Form: Raw Score Raw Score (Scaled/Standard Score) Percentile     Total Trials 1-4 23/36 23/36 (100) 50 Average    Short-Delay Free Recall 7/9 5/9 (7) 16 Below Average    Long-Delay Free Recall 5/9 6/9 (10) 50 Average    Long-Delay Cued Recall 5/9 6/9 (8) 25 Average      Recognition Hits 7/9 7/9 (8) 25 Average      False Positive Errors 1 0 (13) 84 Above Average         Raw Score Raw Score  (Scaled Score) Percentile   RBANS Figure Copy: 15/20 11/20 (2) <1 Exceptionally Low  RBANS Figure Recall: 14/20 10/20 (9) 37 Average        Attention/Executive Function:            Trail Making Test (TMT): Raw Score Raw Score (T Score) Percentile     Part A 28 secs.,  0 errors 31 secs.,  1 error (54) 66 Average    Part B 119 secs.,  2 errors 73 secs.,  0 errors (59) 82 Above Average          Scaled Score Scaled Score Percentile   WAIS-IV Coding: --- 10 50 Average         Scaled Score Scaled Score Percentile   WAIS-IV Digit Span: --- 13 84 Above  Average    Forward 11 15 95 Well Above Average    Backward 14 10 50 Average    Sequencing --- 12 75 Above Average         Scaled Score Scaled Score Percentile   WAIS-IV Similarities: 13 9 37 Average        D-KEFS Color-Word Interference Test: Raw Score Raw Score (Scaled Score) Percentile     Color Naming --- 38 secs. (9) 37 Average    Word Reading --- 24 secs. (11) 63 Average    Inhibition --- 86 secs. (11) 63 Average      Total Errors --- 6 errors (8) 25 Average    Inhibition/Switching --- 54 secs. (15) 95 Well Above Average      Total Errors --- 2 errors (  12) 75 Above Average        D-KEFS Verbal Fluency Test: Raw Score Raw Score (Scaled Score) Percentile     Letter Total Correct --- 23 (7) 16 Below Average    Category Total Correct --- 28 (9) 37 Average    Category Switching Total Correct --- 8 (7) 16 Below Average    Category Switching Accuracy --- 7 (8) 25 Average      Total Set Loss Errors --- 1 (11) 63 Average      Total Repetition Errors --- 2 (11) 63 Average        NAB Executive Functions Module, Form 1: T Score T Score Percentile     Judgment --- 37 9 Below Average        Language:            Verbal Fluency Test: Raw Score Raw Score (T Score) Percentile     Phonemic Fluency (FAS) 45 23 (33) 5 Well Below Average    Animal Fluency 10 15 (43) 25 Average        NAB Language Module, Form 1: T Score T Score Percentile     Auditory Comprehension --- 54 66 Average    Naming 29/31 (50) 30/31 (62) 88 Above Average        Visuospatial/Visuoconstruction:       Raw Score Raw Score Percentile   Clock Drawing: WNL 8/10 --- Within Normal Limits        NAB Spatial Module, Form 1: T Score T Score Percentile     Visual Discrimination --- 35 7 Well Below Average         Scaled Score Scaled Score Percentile   WAIS-IV Block Design: 11 12 75 Above Average  WAIS-IV Matrix Reasoning: 14 10 50 Average        Mood and Personality:       Raw Score Raw Score Percentile   Geriatric  Depression Scale: --- 19 --- Mild  Geriatric Anxiety Scale: --- 14 --- Mild    Somatic --- 4 --- Minimal    Cognitive --- 4 --- Mild    Affective --- 6 --- Mild        Additional Questionnaires:       Raw Score Raw Score Percentile   PROMIS Sleep Disturbance Questionnaire: --- 32 --- Moderate   Informed Consent and Coding/Compliance:   Mr. Brindisi was provided with a verbal description of the nature and purpose of the present neuropsychological evaluation. Also reviewed were the foreseeable risks and/or discomforts and benefits of the procedure, limits of confidentiality, and mandatory reporting requirements of this provider. The patient was given the opportunity to ask questions and receive answers about the evaluation. Oral consent to participate was provided by the patient.   This evaluation was conducted by Christia Reading, Ph.D., licensed clinical neuropsychologist. Mr. Lahaie completed a comprehensive clinical interview with Dr. Melvyn Novas, billed as one unit [phone], and 135 minutes of cognitive testing and scoring, billed as one unit [phone] and four additional units [id number]. Psychometrist Milana Kidney, B.S., assisted Dr. Melvyn Novas with test administration and scoring procedures. As a separate and discrete service, Dr. Melvyn Novas spent a total of 180 minutes in interpretation and report writing billed as one unit [phone] and two units 96133.

## 2019-10-04 NOTE — Progress Notes (Signed)
   Psychometrician Note   Cognitive testing was administered to Samuel Sloan by technician Samuel Sloan, B.S. under the supervision of Dr. Newman Nickels, Ph.D., licensed psychologist. Samuel Sloan did not appear overtly distressed by the testing session, per behavioral observation or via self-report to the technician. Rest breaks were offered.    In considering the patient's current level of functioning, level of presumed impairment, nature of symptoms, emotional and behavioral responses during the interview, level of literacy, and observed level of motivation/effort, a battery of tests was selected and communicated to the psychometrician.   Communication between the psychologist and technician was ongoing throughout the testing session and changes were made as deemed necessary based on patient performance on testing, technician observations and additional pertinent factors such as those listed above.   Samuel Sloan will return within approximately two weeks for an interactive feedback session with Dr. Milbert Coulter at which time his test performances, clinical impressions, and treatment recommendations will be reviewed in detail. The patient understands he can contact our office should he require our assistance before this time.  105 minutes were spent face-to-face with Samuel Sloan administering standardized tests. An additional 30 minutes were spent scoring by the technician. [CPT [id number], [id number]]  This note reflects time spent with the psychometrician and does not include test scores or any clinical interpretations made by Dr. Milbert Coulter. The full report will follow in a separate note.

## 2019-10-09 ENCOUNTER — Emergency Department (HOSPITAL_COMMUNITY)
Admission: EM | Admit: 2019-10-09 | Discharge: 2019-10-09 | Disposition: A | Discharge status: Home / Self Care | Payer: Medicare HMO | Attending: Emergency Medicine | Admitting: Emergency Medicine

## 2019-10-09 ENCOUNTER — Encounter (HOSPITAL_COMMUNITY): Payer: Self-pay

## 2019-10-09 ENCOUNTER — Emergency Department (HOSPITAL_COMMUNITY): Payer: Medicare HMO

## 2019-10-09 ENCOUNTER — Other Ambulatory Visit: Payer: Self-pay

## 2019-10-09 DIAGNOSIS — I10 Essential (primary) hypertension: Secondary | ICD-10-CM | POA: Diagnosis not present

## 2019-10-09 DIAGNOSIS — R410 Disorientation, unspecified: Secondary | ICD-10-CM | POA: Diagnosis not present

## 2019-10-09 DIAGNOSIS — Z79899 Other long term (current) drug therapy: Secondary | ICD-10-CM | POA: Diagnosis not present

## 2019-10-09 DIAGNOSIS — Z86711 Personal history of pulmonary embolism: Secondary | ICD-10-CM | POA: Insufficient documentation

## 2019-10-09 DIAGNOSIS — H539 Unspecified visual disturbance: Secondary | ICD-10-CM | POA: Diagnosis not present

## 2019-10-09 DIAGNOSIS — Z87820 Personal history of traumatic brain injury: Secondary | ICD-10-CM | POA: Insufficient documentation

## 2019-10-09 DIAGNOSIS — Z7982 Long term (current) use of aspirin: Secondary | ICD-10-CM | POA: Diagnosis not present

## 2019-10-09 DIAGNOSIS — R42 Dizziness and giddiness: Secondary | ICD-10-CM | POA: Diagnosis present

## 2019-10-09 LAB — URINALYSIS, ROUTINE W REFLEX MICROSCOPIC
Bilirubin Urine: NEGATIVE
Glucose, UA: NEGATIVE mg/dL
Hgb urine dipstick: NEGATIVE
Ketones, ur: NEGATIVE mg/dL
Nitrite: NEGATIVE
Protein, ur: NEGATIVE mg/dL
Specific Gravity, Urine: 1.02 (ref 1.005–1.030)
pH: 6 (ref 5.0–8.0)

## 2019-10-09 LAB — CBC WITH DIFFERENTIAL/PLATELET
Abs Immature Granulocytes: 0.08 10*3/uL — ABNORMAL HIGH (ref 0.00–0.07)
Basophils Absolute: 0.1 10*3/uL (ref 0.0–0.1)
Basophils Relative: 1 %
Eosinophils Absolute: 0.2 10*3/uL (ref 0.0–0.5)
Eosinophils Relative: 3 %
HCT: 44.7 % (ref 39.0–52.0)
Hemoglobin: 14.9 g/dL (ref 13.0–17.0)
Immature Granulocytes: 1 %
Lymphocytes Relative: 27 %
Lymphs Abs: 1.5 10*3/uL (ref 0.7–4.0)
MCH: 31.6 pg (ref 26.0–34.0)
MCHC: 33.3 g/dL (ref 30.0–36.0)
MCV: 94.7 fL (ref 80.0–100.0)
Monocytes Absolute: 0.4 10*3/uL (ref 0.1–1.0)
Monocytes Relative: 7 %
Neutro Abs: 3.4 10*3/uL (ref 1.7–7.7)
Neutrophils Relative %: 61 %
Platelets: 193 10*3/uL (ref 150–400)
RBC: 4.72 MIL/uL (ref 4.22–5.81)
RDW: 12.7 % (ref 11.5–15.5)
WBC: 5.6 10*3/uL (ref 4.0–10.5)
nRBC: 0 % (ref 0.0–0.2)

## 2019-10-09 LAB — COMPREHENSIVE METABOLIC PANEL
ALT: 19 U/L (ref 0–44)
AST: 25 U/L (ref 15–41)
Albumin: 3.7 g/dL (ref 3.5–5.0)
Alkaline Phosphatase: 58 U/L (ref 38–126)
Anion gap: 8 (ref 5–15)
BUN: 20 mg/dL (ref 8–23)
CO2: 24 mmol/L (ref 22–32)
Calcium: 8.9 mg/dL (ref 8.9–10.3)
Chloride: 105 mmol/L (ref 98–111)
Creatinine, Ser: 1.26 mg/dL — ABNORMAL HIGH (ref 0.61–1.24)
GFR calc Af Amer: >60 mL/min (ref >60)
GFR calc non Af Amer: 52 mL/min — ABNORMAL LOW (ref >60)
Glucose, Bld: 115 mg/dL — ABNORMAL HIGH (ref 70–99)
Potassium: 3.6 mmol/L (ref 3.5–5.1)
Sodium: 137 mmol/L (ref 135–145)
Total Bilirubin: 0.8 mg/dL (ref 0.3–1.2)
Total Protein: 6.1 g/dL — ABNORMAL LOW (ref 6.5–8.1)

## 2019-10-09 LAB — URINALYSIS, MICROSCOPIC (REFLEX): RBC / HPF: NONE SEEN RBC/hpf (ref 0–5)

## 2019-10-09 LAB — PROTIME-INR
INR: 1 (ref 0.8–1.2)
Prothrombin Time: 12.7 seconds (ref 11.4–15.2)

## 2019-10-09 LAB — APTT: aPTT: 27 seconds (ref 24–36)

## 2019-10-09 LAB — RAPID URINE DRUG SCREEN, HOSP PERFORMED
Amphetamines: NOT DETECTED
Barbiturates: NOT DETECTED
Benzodiazepines: NOT DETECTED
Cocaine: NOT DETECTED
Opiates: NOT DETECTED
Tetrahydrocannabinol: NOT DETECTED

## 2019-10-09 LAB — ETHANOL: Alcohol, Ethyl (B): <10 mg/dL (ref <10)
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
## 2019-10-09 NOTE — ED Notes (Addendum)
Pt transported to CT ?

## 2019-10-09 NOTE — ED Provider Notes (Signed)
  Provider Note MRN:  [id number]  Arrival date & time: 10/09/19    ED Course and Medical Decision Making  Assumed care from Dr. Madilyn Hook at shift change.  Transient confusion episode, history of hemorrhagic stroke, awaiting CT head, if unremarkable would obtain MRI.  With negative work-up, would be a candidate for discharge.  7 PM update: CT head is without acute process, MRI is a limited study due to cochlear implant artifact, but no evidence of acute process.  Discussed these results and discussed the limited study of the MRI with patient and family.  Mostly a confusion episode, which per daughter has been happening more more frequently over the past 5 years.  No significant vertiginous symptoms to suggest basilar insufficiency.  Appropriate for discharge with strict return precautions.  Procedures  Final Clinical Impressions(s) / ED Diagnoses     ICD-10-CM   1. Confusion  R41.0     ED Discharge Orders    None        Discharge Instructions     You were evaluated in the Emergency Department and after careful evaluation, we did not find any emergent condition requiring admission or further testing in the hospital.  Your exam/testing today was overall reassuring.  Please return to the Emergency Department if you experience any worsening of your condition.  We encourage you to follow up with a primary care provider.  Thank you for allowing [place] to be a part of your care.      Elmer Sow. Pilar Plate, MD [facility] Emergency Medicine [facility] [email or web address]    Sabas Sous, MD 10/09/19 [phone]

## 2019-10-09 NOTE — ED Triage Notes (Signed)
Pt brought to ED via EMS from [facility] with c/o dizziness, spotted vision, confusion, and diff walking. EMS reports symptoms had resolved by 12:35 when they arrived. Hx of TBI, migraines. EMS reports pt is currently being eval for dementia. Currently A&O x4. Denies pain, states vision is normal.

## 2019-10-09 NOTE — ED Notes (Signed)
Pt transported to MRI 

## 2019-10-09 NOTE — Discharge Instructions (Addendum)
You were evaluated in the Emergency Department and after careful evaluation, we did not find any emergent condition requiring admission or further testing in the hospital. ° °Your exam/testing today was overall reassuring. ° °Please return to the Emergency Department if you experience any worsening of your condition.  We encourage you to follow up with a primary care provider.  Thank you for allowing us to be a part of your care. ° °

## 2019-10-11 ENCOUNTER — Encounter: Payer: Medicare HMO | Admitting: Psychology

## 2019-10-15 ENCOUNTER — Telehealth: Payer: Medicare HMO | Admitting: Psychology

## 2019-10-20 DIAGNOSIS — F411 Generalized anxiety disorder: Secondary | ICD-10-CM | POA: Diagnosis not present

## 2019-10-20 DIAGNOSIS — K219 Gastro-esophageal reflux disease without esophagitis: Secondary | ICD-10-CM | POA: Diagnosis not present

## 2019-10-20 DIAGNOSIS — F331 Major depressive disorder, recurrent, moderate: Secondary | ICD-10-CM | POA: Diagnosis not present

## 2019-10-20 DIAGNOSIS — I10 Essential (primary) hypertension: Secondary | ICD-10-CM | POA: Diagnosis not present

## 2019-10-21 ENCOUNTER — Encounter: Payer: Self-pay | Admitting: Psychology

## 2019-10-21 ENCOUNTER — Ambulatory Visit (INDEPENDENT_AMBULATORY_CARE_PROVIDER_SITE_OTHER): Payer: Medicare HMO | Admitting: Psychology

## 2019-10-21 ENCOUNTER — Other Ambulatory Visit: Payer: Self-pay

## 2019-10-21 DIAGNOSIS — F411 Generalized anxiety disorder: Secondary | ICD-10-CM

## 2019-10-21 DIAGNOSIS — G3184 Mild cognitive impairment, so stated: Secondary | ICD-10-CM

## 2019-10-21 DIAGNOSIS — F330 Major depressive disorder, recurrent, mild: Secondary | ICD-10-CM | POA: Diagnosis not present

## 2019-10-21 DIAGNOSIS — G4733 Obstructive sleep apnea (adult) (pediatric): Secondary | ICD-10-CM | POA: Diagnosis not present

## 2019-10-21 NOTE — Progress Notes (Signed)
   Neuropsychology Feedback Session Samuel Sloan. [facility] [place] Department of Neurology  Reason for Referral:   [name] a 86 y.o. Caucasian male referred by Patrcia Dolly, M.D.,to characterize hiscurrent cognitive functioning and assist with diagnostic clarity and treatment planning in the context of subject cognitive decline, history of subdural hematoma, and potential psychiatric comorbidities.  Feedback:   [name] completed a comprehensive neuropsychological evaluation on 10/04/2019. Please refer to that encounter for the full report and recommendations. Briefly, results suggested neuropsychological functioning largely within appropriate normative ranges. Isolated impairments were exhibited across phonemic fluency, a task assessing visual discrimination, and his copy of a complex figure; however, the latter performance was attributed to a sloppy and rapid approach despite instructions emphasizing attention to detail. Relative to his prior evaluation, significant performance declines were seen across his drawing of a complex figure (described above), phonemic fluency, and abstract reasoning; however, scores across the latter domain remained in the average normative range. Subtle declines were also seen across retrieval aspects of a story learning task while consolidation scores remained intact. Improvements were seen across semantic fluency and a visuomotor task assessing cognitive flexibility.  Regarding etiology, [name] reported difficult to manage symptoms of obstructive sleep apnea, which appear largely untreated at the present time. These symptoms can certainly create/exacerbate cognitive weaknesses and commonly influence processing speed, attention/concentration, and executive functioning. Additionally, he reported ongoing psychiatric distress (mild rates of both anxiety and depression), which can also create cognitive inefficiencies in similar domains.  Ongoing hearing loss can also cause memory lapses if information is unable to be learned efficiently.  It is possible that a combination of these factors are largely responsible for day-to-day cognitive difficulties which [name] has been experiencing presently. While it is difficult to diagnose ADHD currently since this diagnosis requires symptoms to be present before the age of 79, it is apparent that, at the very least, [name] exhibits traits of this condition, which are likely exacerbated by the previously mentioned etiologies.   [name] was accompanied by his daughter. Content of the current session focused on the results of his neuropsychological evaluation. [name] and his daughter were given the opportunity to ask questions and their questions were answered. They were encouraged to reach out should additional questions arise. A copy of his report was provided at the conclusion of the visit.      45 minutes were spent conducting the current feedback session with [name], billed as one unit [phone]

## 2019-10-27 ENCOUNTER — Encounter: Payer: Self-pay | Admitting: Neurology

## 2019-10-27 ENCOUNTER — Ambulatory Visit (INDEPENDENT_AMBULATORY_CARE_PROVIDER_SITE_OTHER): Payer: Medicare HMO | Admitting: Neurology

## 2019-10-27 ENCOUNTER — Other Ambulatory Visit: Payer: Self-pay

## 2019-10-27 VITALS — BP 158/90 | HR 67 | Ht 70.0 in | Wt 184.4 lb

## 2019-10-27 DIAGNOSIS — R4587 Impulsiveness: Secondary | ICD-10-CM

## 2019-10-27 DIAGNOSIS — G3184 Mild cognitive impairment, so stated: Secondary | ICD-10-CM | POA: Diagnosis not present
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
## 2019-10-27 NOTE — Patient Instructions (Addendum)
1. Refer to [facility] for evaluation of ADHD  2. Please have Samuel Sloan come and take out the bottles you are not taking, then write down all the medications you are taking regularly  3. Follow-up in 6 months, call for any changes

## 2019-11-15 DIAGNOSIS — H903 Sensorineural hearing loss, bilateral: Secondary | ICD-10-CM | POA: Diagnosis not present

## 2019-11-15 DIAGNOSIS — Z45321 Encounter for adjustment and management of cochlear device: Secondary | ICD-10-CM | POA: Diagnosis not present

## 2019-11-16 DIAGNOSIS — I10 Essential (primary) hypertension: Secondary | ICD-10-CM | POA: Diagnosis not present

## 2019-11-16 DIAGNOSIS — F331 Major depressive disorder, recurrent, moderate: Secondary | ICD-10-CM | POA: Diagnosis not present

## 2019-11-24 DIAGNOSIS — F411 Generalized anxiety disorder: Secondary | ICD-10-CM | POA: Diagnosis not present

## 2019-11-24 DIAGNOSIS — Z20828 Contact with and (suspected) exposure to other viral communicable diseases: Secondary | ICD-10-CM | POA: Diagnosis not present

## 2019-11-24 DIAGNOSIS — F331 Major depressive disorder, recurrent, moderate: Secondary | ICD-10-CM | POA: Diagnosis not present

## 2019-11-24 DIAGNOSIS — K219 Gastro-esophageal reflux disease without esophagitis: Secondary | ICD-10-CM | POA: Diagnosis not present

## 2019-11-24 DIAGNOSIS — Z1159 Encounter for screening for other viral diseases: Secondary | ICD-10-CM | POA: Diagnosis not present

## 2019-11-24 DIAGNOSIS — Z593 Problems related to living in residential institution: Secondary | ICD-10-CM | POA: Diagnosis not present

## 2019-11-24 DIAGNOSIS — I10 Essential (primary) hypertension: Secondary | ICD-10-CM | POA: Diagnosis not present

## 2020-03-01 ENCOUNTER — Ambulatory Visit: Payer: Medicare HMO | Admitting: Neurology

## 2020-03-06 ENCOUNTER — Ambulatory Visit: Payer: Medicare HMO | Admitting: Neurology

## 2022-01-04 IMAGING — MR MR HEAD W/O CM
9 of 10 series · 42 of 48 positions shown · non-contrast
Comparison: Head CT earlier today.

CLINICAL DATA: 86-year-old male with dizziness, encephalopathy.
Left cochlear implant.

EXAM:
MRI HEAD WITHOUT CONTRAST
TECHNIQUE: Multiplanar, multiecho pulse sequences of the brain and surrounding
structures were obtained without intravenous contrast.

[Series 5: DWI · axial · 3.0mm · 0.88mm/px · z∈[-67,+83]mm · 9 of 102 slices shown (1 of 4)]
[im 1/102]
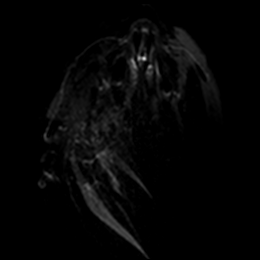
[im 13/102]
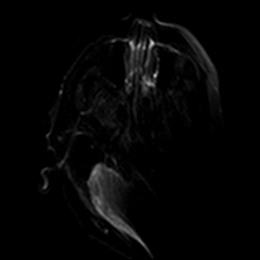
[im 26/102]
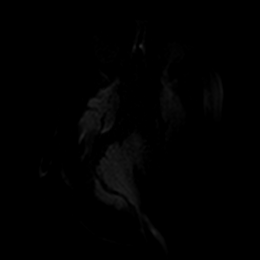
[im 38/102]
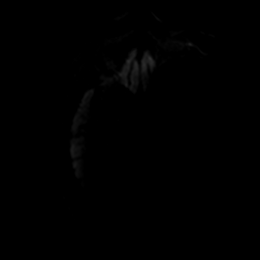
[im 51/102]
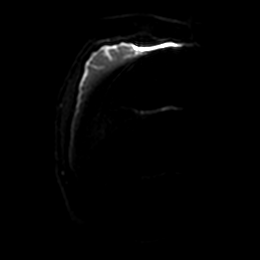
[im 64/102]
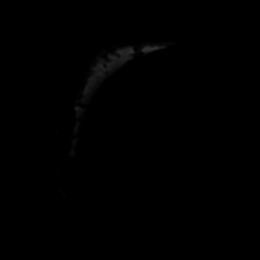
[im 76/102]
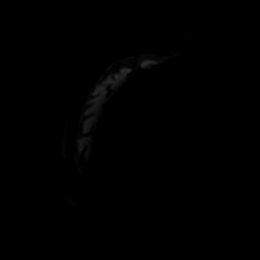
[im 89/102]
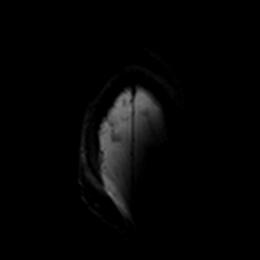
[im 102/102]
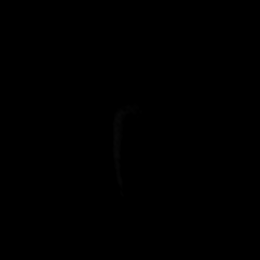

[Series 6: DWI · axial · 3.0mm · 0.88mm/px · z∈[-67,+83]mm · 5 of 51 slices shown (2 of 4)]
[im 1/51]
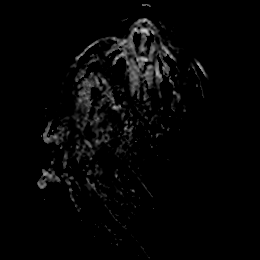
[im 13/51]
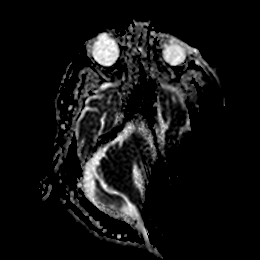
[im 26/51]
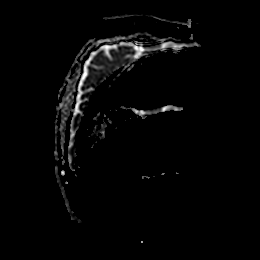
[im 38/51]
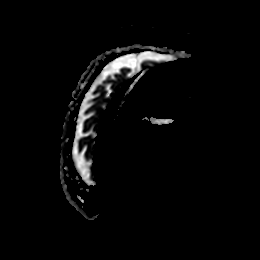
[im 51/51]
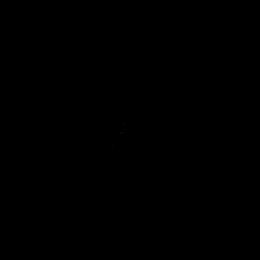

[Series 7: DWI · coronal · 4.0mm · 0.88mm/px · 7 of 72 slices shown (3 of 4)]
[im 1/72]
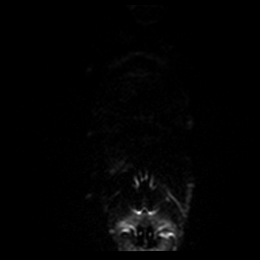
[im 12/72]
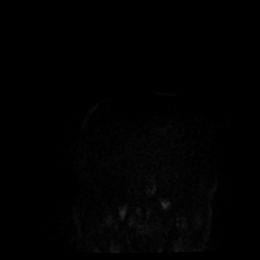
[im 24/72]
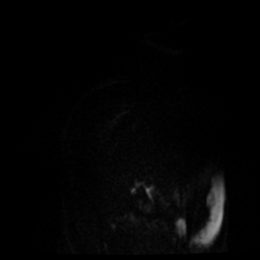
[im 36/72]
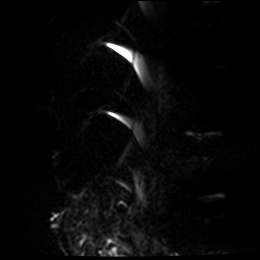
[im 48/72]
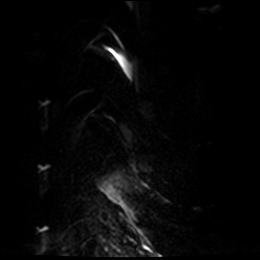
[im 60/72]
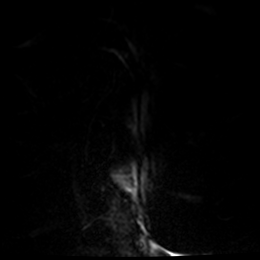
[im 72/72]
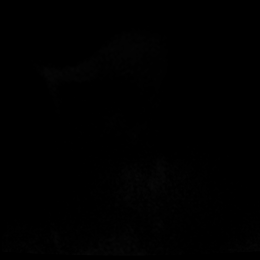

[Series 8: DWI · coronal · 4.0mm · 0.88mm/px · 4 of 37 slices shown (4 of 4)]
[im 1/37]
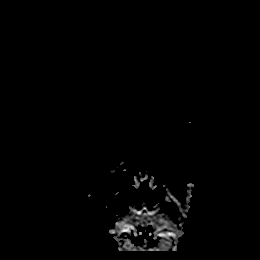
[im 13/37]
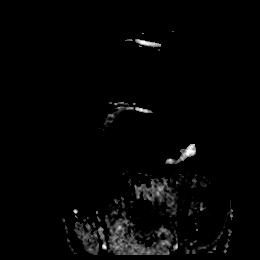
[im 25/37]
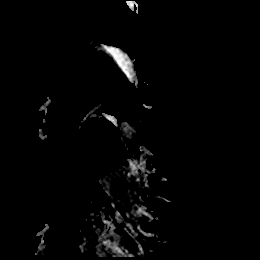
[im 37/37]
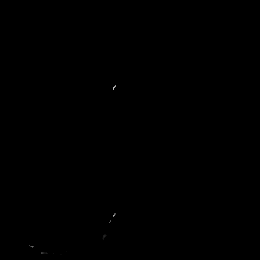

[Series 9: T1 · sagittal · 5.0mm · 0.75mm/px · 2 of 23 slices shown]
[im 1/23]
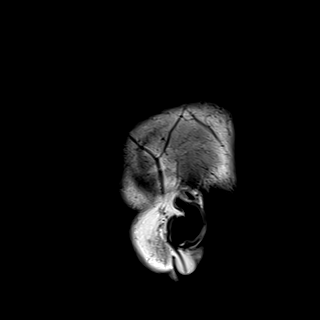
[im 23/23]
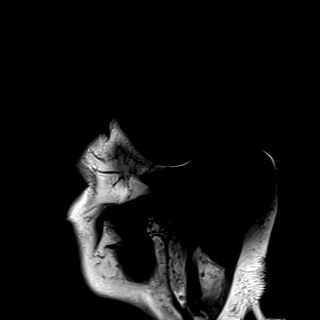

[Series 12: T2 · axial · 5.0mm · 0.72mm/px · z∈[-69,+87]mm · 3 of 27 slices shown (1 of 2)]
[im 1/27]
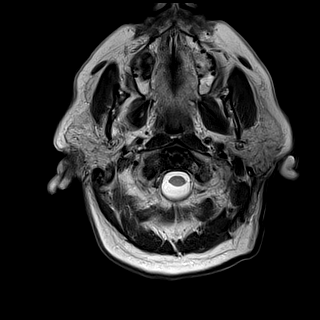
[im 14/27]
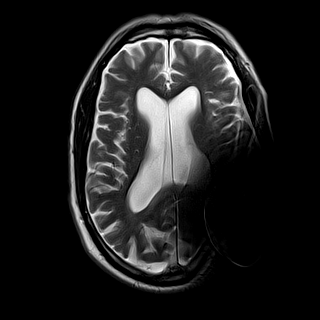
[im 27/27]
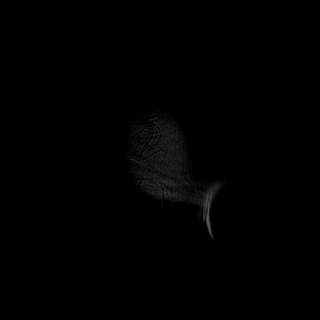

[Series 15: swi_images · axial · 3.0mm · 0.90mm/px · z∈[-76,+101]mm · 6 of 60 slices shown]
[im 1/60]
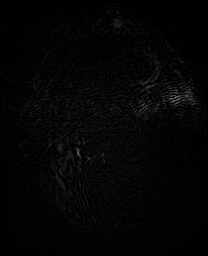
[im 12/60]
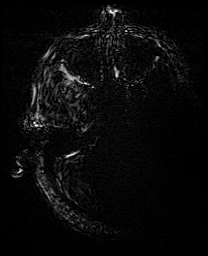
[im 24/60]
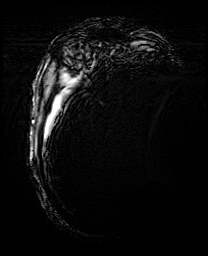
[im 36/60]
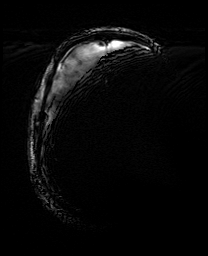
[im 48/60]
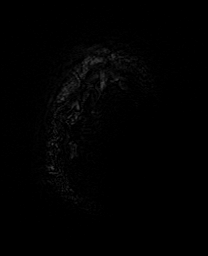
[im 60/60]
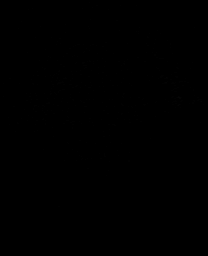

[Series 18: FLAIR · axial · 5.0mm · 0.90mm/px · z∈[-66,+84]mm · 3 of 26 slices shown]
[im 1/26]
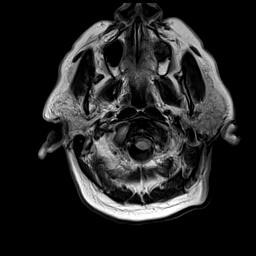
[im 13/26]
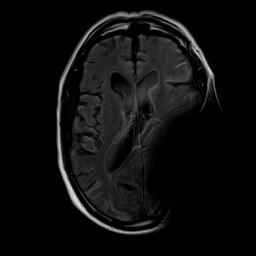
[im 26/26]
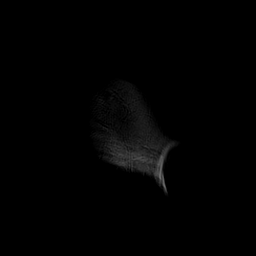

[Series 19: T2 · coronal · 5.0mm · 0.72mm/px · 3 of 28 slices shown (2 of 2)]
[im 1/28]
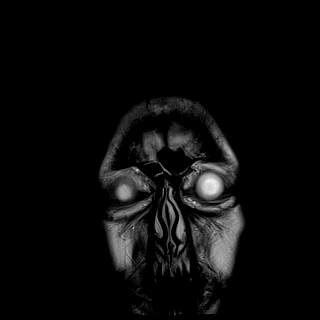
[im 14/28]
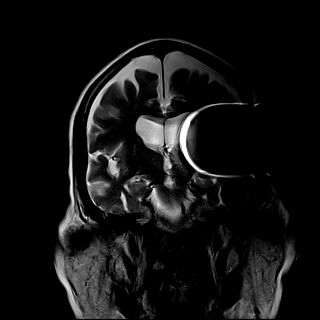
[im 28/28]
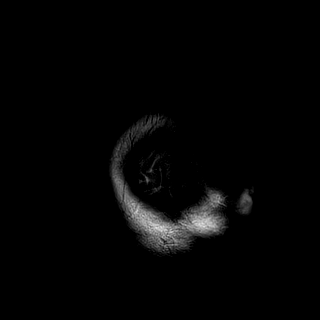

[42 of 48 positions shown; findings below may reference images not displayed]

FINDINGS: Severe susceptibility artifact from the left cochlear implant.
Despite attempts at minimizing the artifact diffusion-weighted
imaging and SWI is nondiagnostic.

Major intracranial vascular flow voids are preserved. No
intracranial mass effect or ventriculomegaly. Basilar cisterns are
normal. Negative pituitary and cervicomedullary junction.

Allowing for susceptibility artifact gray and white matter signal
appears within normal limits throughout the brain.

Partially visible cervical ACDF. There is degenerative ligamentous
hypertrophy about the odontoid. Visualized bone marrow signal is
within normal limits.

Negative orbits. Paranasal sinuses and mastoids are stable and well
pneumatized.
IMPRESSION: 1. Partially nondiagnostic exam due to severe susceptibility from
the left cochlear implant.
2. No significant intracranial abnormality is evident.

## 2022-07-31 ENCOUNTER — Encounter (HOSPITAL_COMMUNITY): Payer: Self-pay | Admitting: Emergency Medicine

## 2022-07-31 ENCOUNTER — Emergency Department (HOSPITAL_COMMUNITY): Payer: Medicare HMO

## 2022-07-31 ENCOUNTER — Emergency Department (HOSPITAL_COMMUNITY)
Admission: EM | Admit: 2022-07-31 | Discharge: 2022-07-31 | Disposition: A | Discharge status: Home / Self Care | Payer: Medicare HMO | Attending: Emergency Medicine | Admitting: Emergency Medicine

## 2022-07-31 DIAGNOSIS — R251 Tremor, unspecified: Secondary | ICD-10-CM | POA: Insufficient documentation

## 2022-07-31 DIAGNOSIS — R531 Weakness: Secondary | ICD-10-CM | POA: Diagnosis not present

## 2022-07-31 DIAGNOSIS — R4182 Altered mental status, unspecified: Secondary | ICD-10-CM | POA: Insufficient documentation

## 2022-07-31 DIAGNOSIS — Z7982 Long term (current) use of aspirin: Secondary | ICD-10-CM | POA: Insufficient documentation

## 2022-07-31 DIAGNOSIS — R0902 Hypoxemia: Secondary | ICD-10-CM | POA: Diagnosis not present

## 2022-07-31 LAB — COMPREHENSIVE METABOLIC PANEL
ALT: 20 U/L (ref 0–44)
AST: 31 U/L (ref 15–41)
Albumin: 4 g/dL (ref 3.5–5.0)
Alkaline Phosphatase: 68 U/L (ref 38–126)
Anion gap: 10 (ref 5–15)
BUN: 25 mg/dL — ABNORMAL HIGH (ref 8–23)
CO2: 24 mmol/L (ref 22–32)
Calcium: 9.4 mg/dL (ref 8.9–10.3)
Chloride: 102 mmol/L (ref 98–111)
Creatinine, Ser: 1.52 mg/dL — ABNORMAL HIGH (ref 0.61–1.24)
GFR, Estimated: 44 mL/min — ABNORMAL LOW (ref >60)
Glucose, Bld: 93 mg/dL (ref 70–99)
Potassium: 3.4 mmol/L — ABNORMAL LOW (ref 3.5–5.1)
Sodium: 136 mmol/L (ref 135–145)
Total Bilirubin: 0.3 mg/dL (ref 0.3–1.2)
Total Protein: 6.3 g/dL — ABNORMAL LOW (ref 6.5–8.1)

## 2022-07-31 LAB — D-DIMER, QUANTITATIVE: D-Dimer, Quant: 0.59 ug/mL-FEU — ABNORMAL HIGH (ref 0.00–0.50)

## 2022-07-31 LAB — CBC WITH DIFFERENTIAL/PLATELET
Abs Immature Granulocytes: 0.02 10*3/uL (ref 0.00–0.07)
Basophils Absolute: 0.1 10*3/uL (ref 0.0–0.1)
Basophils Relative: 1 %
Eosinophils Absolute: 0.3 10*3/uL (ref 0.0–0.5)
Eosinophils Relative: 3 %
HCT: 42.9 % (ref 39.0–52.0)
Hemoglobin: 15.3 g/dL (ref 13.0–17.0)
Immature Granulocytes: 0 %
Lymphocytes Relative: 20 %
Lymphs Abs: 1.6 10*3/uL (ref 0.7–4.0)
MCH: 32.2 pg (ref 26.0–34.0)
MCHC: 35.7 g/dL (ref 30.0–36.0)
MCV: 90.3 fL (ref 80.0–100.0)
Monocytes Absolute: 0.6 10*3/uL (ref 0.1–1.0)
Monocytes Relative: 7 %
Neutro Abs: 5.4 10*3/uL (ref 1.7–7.7)
Neutrophils Relative %: 69 %
Platelets: 216 10*3/uL (ref 150–400)
RBC: 4.75 MIL/uL (ref 4.22–5.81)
RDW: 12.9 % (ref 11.5–15.5)
WBC: 7.9 10*3/uL (ref 4.0–10.5)
nRBC: 0 % (ref 0.0–0.2)

## 2022-07-31 LAB — CBG MONITORING, ED
Glucose-Capillary: 99 mg/dL (ref 70–99)
Glucose-Capillary: 86 mg/dL (ref 70–99)

## 2022-07-31 LAB — ETHANOL: Alcohol, Ethyl (B): <10 mg/dL (ref <10)

## 2022-07-31 MED ORDER — SODIUM CHLORIDE 0.9 % IV SOLN: INTRAVENOUS | Status: DC

## 2022-07-31 NOTE — ED Provider Notes (Signed)
[facility] EMERGENCY DEPARTMENT Provider Note   CSN: [id number] Arrival date & time: 07/31/22  1505     History  Chief Complaint  Patient presents with   Altered Mental Status    Samuel Sloan is a 88 y.o. male.  HPI Patient with cognitive impairment, multiple medical problems presents from nursing facility via EMS.  Patient was initially along, with EMS, but his daughter arrived several hours later, voicing concern about not having been able to communicate her thoughts about her father, and his history, as well as concerns about recent behavior.  The patient self has no complaints, no dyspnea, no leg swelling, no chest pain.  Per EMS the patient has been awake for several days, possibly as many as 5, has been having episodes of shaking, with periods of calm between.  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  Patient does not comment on this.  According to the patient's chart, patient was in [place], had an episode of possible cardiac arrest 1 month ago, see notes below.  Message sounds that the patient was awaiting a sleep study, had a period of apnea, received 30 seconds of CPR, had return of spontaneous circulation and subsequent cardiac evaluation did not demonstrate any abnormalities, recommendation was for outpatient cardiology follow-up.  According to patient's daughter 2 years ago the patient gave away all of his positions and moved to [place] to be with his twin brother.  He was brought back to this area after his recent episode of unresponsiveness.     Allergies    Oxycodone, Amlodipine, Ibuprofen, and Toprol xl  [metoprolol]    Review of Systems   Review of Systems  All other systems reviewed and are negative.   Physical Exam Updated Vital Signs BP 114/84 (BP Location: Right Arm)   Pulse 69   Temp 97.9 F (36.6 C) (Oral)   Resp 18   Ht 5\' 10"  (1.778 m)   Wt 83  kg   SpO2 98%   BMI 26.26 kg/m  Physical Exam Vitals and nursing note reviewed.  Constitutional:      General: He is not in acute distress.    Appearance: He is well-developed.     Comments: Thin adult male with episodes of whole body shaking that lasted several moments, then he is again calm, in no distress, speaking with a clear voice, denying complaints.  HENT:     Head: Normocephalic and atraumatic.     Comments: Hearing aid left-sided Eyes:     Conjunctiva/sclera: Conjunctivae normal.  Cardiovascular:     Rate and Rhythm: Normal rate and regular rhythm.  Pulmonary:     Effort: Pulmonary effort is normal. No respiratory distress.     Breath sounds: No stridor.  Abdominal:     General: There is no distension.  Musculoskeletal:     Comments: No asymmetry of his lower extremities, no complaints of pain, swelling  Skin:    General: Skin is warm and dry.  Neurological:     Mental Status: He is alert.     Comments: Face is symmetric, speech is clear, there is age-appropriate atrophy, but no focal weakness  Psychiatric:        Cognition and Memory: Cognition is impaired. Memory is impaired.     ED Results / Procedures / Treatments   Labs (all labs ordered are listed, but only abnormal results are displayed) Labs Reviewed  COMPREHENSIVE METABOLIC PANEL - Abnormal; Notable for  the following components:      Result Value   Potassium 3.4 (*)    BUN 25 (*)    Creatinine, Ser 1.52 (*)    Total Protein 6.3 (*)    GFR, Estimated 44 (*)    All other components within normal limits  CBC WITH DIFFERENTIAL/PLATELET  ETHANOL  URINALYSIS, ROUTINE W REFLEX MICROSCOPIC  D-DIMER, QUANTITATIVE  CBG MONITORING, ED    EKG EKG Interpretation  Date/Time:  Wednesday July 31 2022 08:42:00 EST Ventricular Rate:  71 PR Interval:  164 QRS Duration: 86 QT Interval:  410 QTC Calculation: 445 R Axis:   95 Text Interpretation: Normal sinus rhythm Rightward axis Borderline ECG  Confirmed by Gerhard Munch [phone]) on 07/31/2022 2:10:50 PM  Radiology CT Head Wo Contrast  Result Date: 07/31/2022 CLINICAL DATA:  Mental status change, unknown cause EXAM: CT HEAD WITHOUT CONTRAST TECHNIQUE: Contiguous axial images were obtained from the base of the skull through the vertex without intravenous contrast. RADIATION DOSE REDUCTION: This exam was performed according to the departmental dose-optimization program which includes automated exposure control, adjustment of the mA and/or kV according to patient size and/or use of iterative reconstruction technique. COMPARISON:  None Available. 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 No evidence of acute infarction, hemorrhage, extra-axial collection, ventriculomegaly, or mass effect. Generalized cerebral atrophy. Vascular: Cerebrovascular atherosclerotic calcifications are noted. Skull: Negative for fracture or focal lesion. Sinuses/Orbits: Visualized portions of the orbits are unremarkable. Visualized portions of the paranasal sinuses are unremarkable. Visualized portions of the mastoid air cells are unremarkable. Other: None. IMPRESSION: 1. No acute intracranial findings. 2. 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 Electronically Signed   By: Elige Ko M.D.   On: 07/31/2022 10:25   DG Chest 1 View  Result Date: 07/31/2022 CLINICAL DATA:  Altered mental status. EXAM: CHEST  1 VIEW COMPARISON:  None Available. FINDINGS: The cardiac silhouette, mediastinal and hilar contours are normal. The lungs are clear of an acute process. No pleural effusions or pulmonary lesions. The bony structures are intact. 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 IMPRESSION: No acute cardiopulmonary findings. Electronically Signed   By: Rudie Meyer M.D.   On: 07/31/2022 09:31    Procedures Procedures     Medications Ordered in ED Medications  0.9 %  sodium chloride infusion (has no administration in time range)    ED Course/ Medical Decision Making/ A&P                           Medical Decision Making Adult male with neurocognitive decline, possible prior TBI, possible prior cardiac arrest presents with concern for sleeplessness, hypersexual elation, and tremulousness.  Differential including intracranial phenomena, electrolyte abnormalities, infection considered.  DVT/PE less likely, but the patient did recently fly from [place] state.  Labs x-ray CT ECG from triage, reviewed after initial full evaluation, and discussion with the patient's daughter.  Amount and/or Complexity of Data Reviewed Independent Historian: caregiver    Details: See HPI External Data Reviewed: notes.    Details: Multiple notes reviewed including note from hospital in [place] state with summary of his episode included below, and multiple neurology visits going back to last 5 years suggesting neurocognitive decline.   Labs: ordered. Decision-making details documented in ED Course.    Details: Unremarkable labs, compared to prior Radiology: ordered. Decision-making details documented in ED Course.  Details: Head CT reviewed, x-ray reviewed, both unremarkable ECG/medicine tests:  Decision-making details documented in ED Course. Discussion of management or test interpretation with external provider(s): ECG unremarkable  Risk Prescription drug management. Decision regarding hospitalization.   From [facility]  Brief HPI and Hospital Course:  88 year old male with a history of OSA, bipap/CPAP user but not having machine, Hypertension (on Chlorthalidone, losartan, and verapamil), BPH, restless leg syndrome, GERD. He was hospitalized after an apparent cardiac arrest while he was on his chair waiting for a sleep study. Pt received chest compressions for 30 seconds and returned to ROSC. HR 64, rr  14, BP 93/50. No cardiac monitor was present during the episode. Pt hypotension improved with IVF. No head trauma or other trauma was noted or mentioned.   The admission hospitalist, during the first interview, found the pt sleepy and frequently falling asleep during the interview. HR 39 and no other changes in vital signs.   Cardiopulmonary arrest with ROSC, bradycardia, hypotension resolved. Verapamil and clonazepam were discontinued, which improved HR considerably. Cardiology due to trop neg x2, Echocardiograms been unremarkable. EKGs have been unremarkable as well. Telemetry during his stay has shown periods of monomorphic PVCs with some episodes of ventricular bigeminy. Short runs of 3-4 consecutive PVCs have been seen. No significant bradycardia or AV block or sustained ventricular tachycardia has been seen. Recommended: No further cardiac evaluation is necessary. Would potentially consider a month-long cardiac monitor as an outpatient. If the patient is returning to [place] he can establish care there and consider monitoring. No further cardiology follow-up is necessary unless the monitoring shows something significant. (fragment taken from note)  From [facility]: Mr. Burdette presents for neurosurgical follow up with Dr. Lemar Livings after recent hospitalization for worsening falls, headaches and mental status worsening over the last 2 months. He was found to have a mixed-density right sided subdural hematoma with 6 mm midline shift and brain compression. He was managed with SEPS drain, placed 05/29/2016, for evacuation of subdural.   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 However, due to recent SDH he cannot be anticoagulated for at least one month and perhaps longer. Vascular Surgery was consulted and an IVC filter was placed on 06/05/2016. He was discharged to a SNF due to lack of 24 hr assist at home on  06/06/2016. His Coumadin should not be restarted until he sees Dr. Shawnee Knapp, a vascular specialist, at 4 weeks after discharge.   This adult male presents from his nursing facility with staff concerns of hyper sexualuality, insomnia, tremors.  The patient self has no concerns, though he does note that he has had ongoing episodes of tremors for some time without ever being given a firm diagnosis.  Patient's daughter has multiple concerns, he is aggressive, irate with staff, not acknowledging that the patient has been monitored by staff since his arrival due to the absence of a caregiver with him and concern for his tremors.  Daughter is concerned about patient's entire medical history including possible history of prior DVT/PE, as well as cognitive decline.  Evaluation here performed after additional labs obtained information obtained from other hospitals and neurology clinics.  Evaluation here not consistent with acute new findings, suspicious for continued neurocognitive decline, behavioral development.  Labs suggestive of mild dehydration, but no substantial electrolyte abnormalities.  D-dimer is age-adjusted unremarkable and the patient has no complaints or physical exam findings consistent with pulmonary embolism  or DVT.  As an aside, the patient is not a candidate for anticoagulation, and is already taking aspirin which is likely his maximal appropriate therapy.  4:00 PM On final repeat exam the patient is calm, has a brief period of shaking, but then resumes being calm, pleasantly interactive.  His daughter, and I, and he had a lengthy conversation about all findings, suspicion for progression of chronic conditions, absence of acute findings.  We discussed appropriate follow-up and this was facilitated as much as possible, including daughters request for suggestions about locations for neurology follow-up beyond those in [place].  Final Clinical Impression(s) / ED Diagnoses Final diagnoses:  Tremor   Weakness     Gerhard MunchLockwood, Carina Chaplin, MD 07/31/22 1601

## 2022-07-31 NOTE — ED Notes (Addendum)
Pts daughter approached RN in triage after her father had been discharged. Pts daughter asking for staff members name and upset when RN did not know the names she wanted. RN tried to have family leave triage area but daughter was hostile asking for names.

## 2022-07-31 NOTE — ED Provider Triage Note (Signed)
Emergency Medicine Provider Triage Evaluation Note  Samuel Sloan , a 88 y.o. male  was evaluated in triage.  Pt complains of tremors, episodically.  History is obtained by EMS individuals will bring them from [place] or nursing facility.  They note that staff notes that the patient has been sleepless for possibly as long as 5 days, has been acting erratically.  Patient denies pain, wants to demonstrate a video that may have captured his tremors, but cannot remember his password to do so.  He is otherwise currently without complaint.  Review of Systems  Positive: Per HPI Negative: Pain  Physical Exam  BP 114/84 (BP Location: Right Arm)   Pulse 69   Temp 97.9 F (36.6 C) (Oral)   Resp 18   SpO2 97%  Gen:   Awake, no distress elderly gentleman ambulatory Resp:  Normal effort no increased work of breathing MSK:   Moves extremities without difficulty no deformity Other:  Neuro: No tremor, moves all extremity spontaneously, ambulatory, face is symmetric  Medical Decision Making  Medically screening exam initiated at 8:37 AM.  Appropriate orders placed.  Samuel Sloan was informed that the remainder of the evaluation will be completed by another provider, this initial triage assessment does not replace that evaluation, and the importance of remaining in the ED until their evaluation is complete.   Samuel Munch, MD 07/31/22 [phone]

## 2022-07-31 NOTE — Discharge Instructions (Signed)
As discussed, today's evaluation has been generally reassuring in terms of not demonstrating any evidence for acute new issues such as stroke, heart attack, or blood clot.  However, with your weakness, and tremors, in particular with your history of neurologic disorder is important that you follow-up with our physicians in their clinics for additional evaluation.  Specifically, please be sure to follow-up with the sleep specialist next week, our cardiology colleagues, and a neurocognitive specialist, possibly at [facility] or another nearby academic institution.  In terms of your going episodic tremors and sleeplessness, please focus on good sleep habits.  This includes stopping use of technology devices at least 3 hours prior to anticipated bedtime.  Return here for concerning changes in your condition.

## 2022-07-31 NOTE — ED Notes (Signed)
Spoke with pts daughter. she became upset about dad being in the waiting area. Charge nurse was not available to speak with her. She refused to leave phone number for callback. Also gave my name and AD name . PT daughter then stated her concerns for the pt , and the message was relayed to The triage RN.

## 2022-07-31 NOTE — ED Triage Notes (Signed)
Pt arrives via EMS from indp living [place] with reports of tremors and not feeling well over night. Negative stroke per EMS. Hx of TBI. Pt A&Ox2. EMS was able to talk to daughter and she was able to see that he has been watching tv and awake for 5 days straight.

## 2022-07-31 NOTE — ED Notes (Signed)
Sort NT told Triage RN that pt's daughter was in lobby and wanting to speak with management. Pt's daughter brought back to triage to sit with pt. Daughter approached this RN questing name and title. Daughter agitated that the pt was still sitting in triage. Rn attempted to explain the process to the daughter, but daughter was hostile and raising voice at [employer]. EDP walked into triage during this and talked to family.

## 2022-07-31 NOTE — ED Notes (Addendum)
Pt daughter remains in triage  accompanying patient. MD to speak with pt and pt daughter . Pt daughter spoke with MD in long lengths., Pt daughter remains agitated after POC discussed with her by MD. Pt daughter raising voice etc. This RN attempted to explain ED process and POC with daughter. Pt daughter not open to communication with this nurse, she is verbally abusive, disruptive to triage area. Consulting civil engineer and EDP aware of situation.

## 2022-07-31 NOTE — ED Notes (Signed)
CBG- 99 

## 2022-08-08 ENCOUNTER — Ambulatory Visit: Payer: Medicare HMO | Attending: Internal Medicine | Admitting: Internal Medicine

## 2022-08-08 ENCOUNTER — Encounter: Payer: Self-pay | Admitting: Internal Medicine

## 2022-08-08 ENCOUNTER — Encounter: Payer: Self-pay | Admitting: *Deleted

## 2022-08-08 VITALS — BP 118/75 | HR 68 | Ht 71.0 in | Wt 169.2 lb

## 2022-08-08 DIAGNOSIS — R55 Syncope and collapse: Secondary | ICD-10-CM | POA: Diagnosis not present

## 2022-08-08 NOTE — Progress Notes (Signed)
Patient enrolled for [employer] to ship a 30 day cardiac event monitor to his address on file. 

## 2022-08-08 NOTE — Patient Instructions (Addendum)
Medication Instructions:  Your physician recommends that you continue on your current medications as directed. Please refer to the Current Medication list given to you today.  *If you need a refill on your cardiac medications before your next appointment, please call your pharmacy*   Lab Work: none If you have labs (blood work) drawn today and your tests are completely normal, you will receive your results only by: MyChart Message (if you have MyChart) OR A paper copy in the mail If you have any lab test that is abnormal or we need to change your treatment, we will call you to review the results.   Testing/Procedures: Your physician has requested that you have an echocardiogram. Echocardiography is a painless test that uses sound waves to create images of your heart. It provides your doctor with information about the size and shape of your heart and how well your heart's chambers and valves are working. This procedure takes approximately one hour. There are no restrictions for this procedure. Please do NOT wear cologne, perfume, aftershave, or lotions (deodorant is allowed). Please arrive 15 minutes prior to your appointment time.  [employer] Cardiac Event Monitor Instructions Your physician has requested you wear your cardiac event monitor for 30 days, (1-30). [employer] may call or text to confirm a shipping address. The monitor will be sent to a land address via UPS. [employer] will not ship a monitor to a PO BOX. It typically takes 3-5 days to receive your monitor after it has been enrolled. [employer] will assist with USPS tracking if your package is delayed. The telephone number for [employer] is [phone]. Once you have received your monitor, please review the enclosed instructions. Instruction tutorials can also be viewed under help and settings on the enclosed cell phone. Your monitor has already been registered assigning a specific monitor serial # to you.  Applying the  monitor Remove cell phone from case and turn it on. The cell phone works as IT consultant and needs to be within [employer] of you at all times. The cell phone will need to be charged on a daily basis. We recommend you plug the cell phone into the enclosed charger at your bedside table every night.  Monitor batteries: You will receive two monitor batteries labelled #1 and #2. These are your recorders. Plug battery #2 onto the second connection on the enclosed charger. Keep one battery on the charger at all times. This will keep the monitor battery deactivated. It will also keep it fully charged for when you need to switch your monitor batteries. A small light will be blinking on the battery emblem when it is charging. The light on the battery emblem will remain on when the battery is fully charged.  Open package of a Monitor strip. Insert battery #1 into black hood on strip and gently squeeze monitor battery onto connection as indicated in instruction booklet. Set aside while preparing skin.  Choose location for your strip, vertical or horizontal, as indicated in the instruction booklet. Shave to remove all hair from location. There cannot be any lotions, oils, powders, or colognes on skin where monitor is to be applied. Wipe skin clean with enclosed Saline wipe. Dry skin completely.  Peel paper labeled #1 off the back of the Monitor strip exposing the adhesive. Place the monitor on the chest in the vertical or horizontal position shown in the instruction booklet. One arrow on the monitor strip must be pointing upward. Carefully remove paper labeled #2, attaching remainder of strip to your  skin. Try not to create any folds or wrinkles in the strip as you apply it.  Firmly press and release the circle in the center of the monitor battery. You will hear a small beep. This is turning the monitor battery on. The heart emblem on the monitor battery will light up every 5 seconds if the monitor  battery in turned on and connected to the patient securely. Do not push and hold the circle down as this turns the monitor battery off. The cell phone will locate the monitor battery. A screen will appear on the cell phone checking the connection of your monitor strip. This may read poor connection initially but change to good connection within the next minute. Once your monitor accepts the connection you will hear a series of 3 beeps followed by a climbing crescendo of beeps. A screen will appear on the cell phone showing the two monitor strip placement options. Touch the picture that demonstrates where you applied the monitor strip.  Your monitor strip and battery are waterproof. You are able to shower, bathe, or swim with the monitor on. They just ask you do not submerge deeper than 3 feet underwater. We recommend removing the monitor if you are swimming in a lake, river, or ocean.  Your monitor battery will need to be switched to a fully charged monitor battery approximately once a week. The cell phone will alert you of an action which needs to be made.  On the cell phone, tap for details to reveal connection status, monitor battery status, and cell phone battery status. The green dots indicates your monitor is in good status. A red dot indicates there is something that needs your attention.  To record a symptom, click the circle on the monitor battery. In 30-60 seconds a list of symptoms will appear on the cell phone. Select your symptom and tap save. Your monitor will record a sustained or significant arrhythmia regardless of you clicking the button. Some patients do not feel the heart rhythm irregularities. [employer] will notify us of any serious or critical events.  Refer to instruction booklet for instructions on switching batteries, changing strips, the Do not disturb or Pause features, or any additional questions.  Call [employer] at [phone], to confirm your monitor is  transmitting and record your baseline. They will answer any questions you may have regarding the monitor instructions at that time.  Returning the monitor to [employer] Place all equipment back into blue box. Peel off strip of paper to expose adhesive and close box securely. There is a prepaid UPS shipping label on this box. Drop in a UPS drop box, or at a UPS facility like [employer]. You may also contact [employer] to arrange UPS to pick up monitor package at your home.    Follow-Up: At [facility], you and your health needs are our priority.  As part of our continuing mission to provide you with exceptional heart care, we have created designated Provider Care Teams.  These Care Teams include your primary Cardiologist (physician) and Advanced Practice Providers (APPs -  Physician Assistants and Nurse Practitioners) who all work together to provide you with the care you need, when you need it.  We recommend signing up for the patient portal called "MyChart".  Sign up information is provided on this After Visit Summary.  MyChart is used to connect with patients for Virtual Visits (Telemedicine).  Patients are able to view lab/test results, encounter notes, upcoming appointments, etc.  Non-urgent messages can be  sent to your provider as well.   To learn more about what you can do with MyChart, go to [email or web address].    Your next appointment:   AS Need - Pending Results of Test   The format for your next appointment:   In Person  Provider:   Dr. Carolan Clines  Other Instructions   Important Information About Sugar
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
## 2022-08-13 DIAGNOSIS — G4733 Obstructive sleep apnea (adult) (pediatric): Secondary | ICD-10-CM | POA: Diagnosis not present

## 2022-08-13 DIAGNOSIS — M25421 Effusion, right elbow: Secondary | ICD-10-CM | POA: Diagnosis not present

## 2022-08-13 DIAGNOSIS — S065XAA Traumatic subdural hemorrhage with loss of consciousness status unknown, initial encounter: Secondary | ICD-10-CM | POA: Diagnosis not present

## 2022-08-26 ENCOUNTER — Inpatient Hospital Stay: Payer: Medicare HMO | Attending: Internal Medicine

## 2022-08-26 DIAGNOSIS — R55 Syncope and collapse: Secondary | ICD-10-CM | POA: Diagnosis not present

## 2022-08-27 DIAGNOSIS — Z4889 Encounter for other specified surgical aftercare: Secondary | ICD-10-CM | POA: Diagnosis not present

## 2022-08-27 DIAGNOSIS — Z9621 Cochlear implant status: Secondary | ICD-10-CM | POA: Diagnosis not present

## 2022-08-27 DIAGNOSIS — H903 Sensorineural hearing loss, bilateral: Secondary | ICD-10-CM | POA: Diagnosis not present

## 2022-08-27 DIAGNOSIS — H938X2 Other specified disorders of left ear: Secondary | ICD-10-CM | POA: Diagnosis not present

## 2022-08-27 DIAGNOSIS — G4730 Sleep apnea, unspecified: Secondary | ICD-10-CM | POA: Diagnosis not present

## 2022-08-27 DIAGNOSIS — G2581 Restless legs syndrome: Secondary | ICD-10-CM | POA: Diagnosis not present

## 2022-08-27 DIAGNOSIS — H6122 Impacted cerumen, left ear: Secondary | ICD-10-CM | POA: Diagnosis not present

## 2022-08-27 DIAGNOSIS — Z885 Allergy status to narcotic agent status: Secondary | ICD-10-CM | POA: Diagnosis not present

## 2022-08-27 DIAGNOSIS — I10 Essential (primary) hypertension: Secondary | ICD-10-CM | POA: Diagnosis not present

## 2022-08-27 DIAGNOSIS — Z7901 Long term (current) use of anticoagulants: Secondary | ICD-10-CM | POA: Diagnosis not present

## 2022-09-06 ENCOUNTER — Ambulatory Visit (HOSPITAL_COMMUNITY): Payer: Medicare HMO | Attending: Internal Medicine

## 2022-09-06 DIAGNOSIS — R55 Syncope and collapse: Secondary | ICD-10-CM | POA: Diagnosis not present

## 2022-09-06 DIAGNOSIS — N1831 Chronic kidney disease, stage 3a: Secondary | ICD-10-CM | POA: Diagnosis not present

## 2022-09-06 DIAGNOSIS — I129 Hypertensive chronic kidney disease with stage 1 through stage 4 chronic kidney disease, or unspecified chronic kidney disease: Secondary | ICD-10-CM | POA: Diagnosis not present

## 2022-09-06 DIAGNOSIS — H35369 Drusen (degenerative) of macula, unspecified eye: Secondary | ICD-10-CM | POA: Diagnosis not present

## 2022-09-06 DIAGNOSIS — Z23 Encounter for immunization: Secondary | ICD-10-CM | POA: Diagnosis not present

## 2022-09-06 DIAGNOSIS — G4700 Insomnia, unspecified: Secondary | ICD-10-CM | POA: Diagnosis not present

## 2022-09-06 DIAGNOSIS — R270 Ataxia, unspecified: Secondary | ICD-10-CM | POA: Diagnosis not present

## 2022-09-06 DIAGNOSIS — K219 Gastro-esophageal reflux disease without esophagitis: Secondary | ICD-10-CM | POA: Diagnosis not present

## 2022-09-06 DIAGNOSIS — R4189 Other symptoms and signs involving cognitive functions and awareness: Secondary | ICD-10-CM | POA: Diagnosis not present

## 2022-09-06 DIAGNOSIS — K8689 Other specified diseases of pancreas: Secondary | ICD-10-CM | POA: Diagnosis not present

## 2022-09-06 LAB — ECHOCARDIOGRAM COMPLETE
AR max vel: 1.43 cm2
AV Area VTI: 1.4 cm2
AV Area mean vel: 1.4 cm2
AV Mean grad: 9 mmHg
AV Peak grad: 19.4 mmHg
Ao pk vel: 2.2 m/s
Area-P 1/2: 2.09 cm2
S' Lateral: 2.7 cm

## 2022-09-10 DIAGNOSIS — R2689 Other abnormalities of gait and mobility: Secondary | ICD-10-CM | POA: Diagnosis not present

## 2022-09-10 DIAGNOSIS — N179 Acute kidney failure, unspecified: Secondary | ICD-10-CM | POA: Diagnosis not present

## 2022-09-10 DIAGNOSIS — M6281 Muscle weakness (generalized): Secondary | ICD-10-CM | POA: Diagnosis not present

## 2022-09-10 DIAGNOSIS — R488 Other symbolic dysfunctions: Secondary | ICD-10-CM | POA: Diagnosis not present

## 2022-09-10 DIAGNOSIS — R41841 Cognitive communication deficit: Secondary | ICD-10-CM | POA: Diagnosis not present

## 2022-09-10 DIAGNOSIS — Z9181 History of falling: Secondary | ICD-10-CM | POA: Diagnosis not present

## 2022-09-17 DIAGNOSIS — R2689 Other abnormalities of gait and mobility: Secondary | ICD-10-CM | POA: Diagnosis not present

## 2022-09-17 DIAGNOSIS — M6281 Muscle weakness (generalized): Secondary | ICD-10-CM | POA: Diagnosis not present

## 2022-09-17 DIAGNOSIS — Z9181 History of falling: Secondary | ICD-10-CM | POA: Diagnosis not present

## 2022-09-17 DIAGNOSIS — R488 Other symbolic dysfunctions: Secondary | ICD-10-CM | POA: Diagnosis not present

## 2022-09-17 DIAGNOSIS — R41841 Cognitive communication deficit: Secondary | ICD-10-CM | POA: Diagnosis not present

## 2022-09-18 DIAGNOSIS — R41841 Cognitive communication deficit: Secondary | ICD-10-CM | POA: Diagnosis not present

## 2022-09-18 DIAGNOSIS — F8082 Social pragmatic communication disorder: Secondary | ICD-10-CM | POA: Diagnosis not present

## 2022-09-18 DIAGNOSIS — R488 Other symbolic dysfunctions: Secondary | ICD-10-CM | POA: Diagnosis not present

## 2022-09-19 ENCOUNTER — Telehealth: Payer: Self-pay | Admitting: *Deleted

## 2022-09-19 NOTE — Telephone Encounter (Signed)
Received fax from [employer]  Report: 1st documentation of Afib RVR (rate 110), sinus rhythm with PVCs rate 68 on 09/15/2022 at 5:11 PM  Ordering provider Dr. Harl Bowie for possible syncope  Reviewed with Dr. Lennie Muckle orders at this time, will review final report.   Patient on ASA, hx of brain bleed on Eliquis-contraindicated/TBI 2/2 multiple falls.

## 2022-09-20 DIAGNOSIS — F8082 Social pragmatic communication disorder: Secondary | ICD-10-CM | POA: Diagnosis not present

## 2022-09-20 DIAGNOSIS — R488 Other symbolic dysfunctions: Secondary | ICD-10-CM | POA: Diagnosis not present

## 2022-09-20 DIAGNOSIS — R41841 Cognitive communication deficit: Secondary | ICD-10-CM | POA: Diagnosis not present

## 2022-09-20 NOTE — Telephone Encounter (Signed)
Follow Up:     Daughter is  returning Samuel Sloan's call from today.

## 2022-09-20 NOTE — Telephone Encounter (Signed)
Attempted to call patient, left message for patient to call back to office.     Janina Mayo, MD  You; Tobin Chad, Angie Fava A, [place] hours ago (1:10 PM)   MB May be flutter hard to say. Please schedule a follow up appointment in 4 weeks

## 2022-09-20 NOTE — Telephone Encounter (Signed)
Spoke with patient's daughter. She is upset that the monitor alert was from 09/15/22 and they are just now getting a call about this. Explained that the monitor company sends [place] the information and then the provider reviews, of which Dr. Harl Bowie did and did not make changes, and then we notify patient of alert/changes. Unclear if there was an outbound call on 2/1 when fax was received.   Scheduled for monitor f/u on 2/15 at 4:20pm with Dr. Harl Bowie

## 2022-09-23 ENCOUNTER — Inpatient Hospital Stay (HOSPITAL_COMMUNITY)
Admission: EM | Admit: 2022-09-23 | Discharge: 2022-09-27 | DRG: 880 | Disposition: A | Discharge status: Home Health | Payer: Medicare HMO | Attending: Internal Medicine | Admitting: Internal Medicine

## 2022-09-23 ENCOUNTER — Emergency Department (HOSPITAL_COMMUNITY): Payer: Medicare HMO

## 2022-09-23 DIAGNOSIS — F418 Other specified anxiety disorders: Secondary | ICD-10-CM | POA: Diagnosis not present

## 2022-09-23 DIAGNOSIS — I129 Hypertensive chronic kidney disease with stage 1 through stage 4 chronic kidney disease, or unspecified chronic kidney disease: Secondary | ICD-10-CM | POA: Diagnosis present

## 2022-09-23 DIAGNOSIS — R569 Unspecified convulsions: Principal | ICD-10-CM

## 2022-09-23 DIAGNOSIS — Z79899 Other long term (current) drug therapy: Secondary | ICD-10-CM

## 2022-09-23 DIAGNOSIS — F411 Generalized anxiety disorder: Secondary | ICD-10-CM | POA: Diagnosis present

## 2022-09-23 DIAGNOSIS — R41841 Cognitive communication deficit: Secondary | ICD-10-CM | POA: Diagnosis not present

## 2022-09-23 DIAGNOSIS — K8689 Other specified diseases of pancreas: Secondary | ICD-10-CM | POA: Diagnosis present

## 2022-09-23 DIAGNOSIS — Z7982 Long term (current) use of aspirin: Secondary | ICD-10-CM | POA: Diagnosis not present

## 2022-09-23 DIAGNOSIS — K219 Gastro-esophageal reflux disease without esophagitis: Secondary | ICD-10-CM | POA: Diagnosis present

## 2022-09-23 DIAGNOSIS — Z86711 Personal history of pulmonary embolism: Secondary | ICD-10-CM

## 2022-09-23 DIAGNOSIS — N1831 Chronic kidney disease, stage 3a: Secondary | ICD-10-CM | POA: Diagnosis not present

## 2022-09-23 DIAGNOSIS — Z8249 Family history of ischemic heart disease and other diseases of the circulatory system: Secondary | ICD-10-CM | POA: Diagnosis not present

## 2022-09-23 DIAGNOSIS — F308 Other manic episodes: Secondary | ICD-10-CM | POA: Insufficient documentation

## 2022-09-23 DIAGNOSIS — G4731 Primary central sleep apnea: Secondary | ICD-10-CM | POA: Diagnosis present

## 2022-09-23 DIAGNOSIS — G4733 Obstructive sleep apnea (adult) (pediatric): Secondary | ICD-10-CM | POA: Diagnosis not present

## 2022-09-23 DIAGNOSIS — F445 Conversion disorder with seizures or convulsions: Principal | ICD-10-CM | POA: Diagnosis present

## 2022-09-23 DIAGNOSIS — Z8679 Personal history of other diseases of the circulatory system: Secondary | ICD-10-CM

## 2022-09-23 DIAGNOSIS — Z6372 Alcoholism and drug addiction in family: Secondary | ICD-10-CM

## 2022-09-23 DIAGNOSIS — Z66 Do not resuscitate: Secondary | ICD-10-CM | POA: Diagnosis not present

## 2022-09-23 DIAGNOSIS — I10 Essential (primary) hypertension: Secondary | ICD-10-CM | POA: Diagnosis present

## 2022-09-23 DIAGNOSIS — W19XXXS Unspecified fall, sequela: Secondary | ICD-10-CM | POA: Diagnosis present

## 2022-09-23 DIAGNOSIS — N401 Enlarged prostate with lower urinary tract symptoms: Secondary | ICD-10-CM | POA: Diagnosis present

## 2022-09-23 DIAGNOSIS — Z811 Family history of alcohol abuse and dependence: Secondary | ICD-10-CM

## 2022-09-23 DIAGNOSIS — S069X9S Unspecified intracranial injury with loss of consciousness of unspecified duration, sequela: Secondary | ICD-10-CM | POA: Diagnosis not present

## 2022-09-23 DIAGNOSIS — Z7189 Other specified counseling: Secondary | ICD-10-CM | POA: Diagnosis not present

## 2022-09-23 DIAGNOSIS — G2581 Restless legs syndrome: Secondary | ICD-10-CM | POA: Diagnosis not present

## 2022-09-23 DIAGNOSIS — Z886 Allergy status to analgesic agent status: Secondary | ICD-10-CM | POA: Diagnosis not present

## 2022-09-23 DIAGNOSIS — E876 Hypokalemia: Secondary | ICD-10-CM | POA: Diagnosis not present

## 2022-09-23 DIAGNOSIS — Z888 Allergy status to other drugs, medicaments and biological substances status: Secondary | ICD-10-CM

## 2022-09-23 DIAGNOSIS — H9190 Unspecified hearing loss, unspecified ear: Secondary | ICD-10-CM | POA: Diagnosis present

## 2022-09-23 DIAGNOSIS — S062XAS Diffuse traumatic brain injury with loss of consciousness status unknown, sequela: Secondary | ICD-10-CM | POA: Diagnosis not present

## 2022-09-23 DIAGNOSIS — R338 Other retention of urine: Secondary | ICD-10-CM | POA: Diagnosis present

## 2022-09-23 DIAGNOSIS — R001 Bradycardia, unspecified: Secondary | ICD-10-CM | POA: Diagnosis not present

## 2022-09-23 DIAGNOSIS — I4892 Unspecified atrial flutter: Secondary | ICD-10-CM | POA: Diagnosis not present

## 2022-09-23 DIAGNOSIS — Z885 Allergy status to narcotic agent status: Secondary | ICD-10-CM

## 2022-09-23 DIAGNOSIS — G609 Hereditary and idiopathic neuropathy, unspecified: Secondary | ICD-10-CM | POA: Diagnosis present

## 2022-09-23 DIAGNOSIS — F8082 Social pragmatic communication disorder: Secondary | ICD-10-CM | POA: Diagnosis not present

## 2022-09-23 DIAGNOSIS — G4737 Central sleep apnea in conditions classified elsewhere: Secondary | ICD-10-CM | POA: Diagnosis present

## 2022-09-23 DIAGNOSIS — R488 Other symbolic dysfunctions: Secondary | ICD-10-CM | POA: Diagnosis not present

## 2022-09-23 DIAGNOSIS — Z95828 Presence of other vascular implants and grafts: Secondary | ICD-10-CM

## 2022-09-23 DIAGNOSIS — R296 Repeated falls: Secondary | ICD-10-CM

## 2022-09-23 DIAGNOSIS — E871 Hypo-osmolality and hyponatremia: Secondary | ICD-10-CM | POA: Diagnosis not present

## 2022-09-23 DIAGNOSIS — Z87820 Personal history of traumatic brain injury: Secondary | ICD-10-CM

## 2022-09-23 DIAGNOSIS — Z515 Encounter for palliative care: Secondary | ICD-10-CM | POA: Diagnosis not present

## 2022-09-23 HISTORY — DX: Benign prostatic hyperplasia without lower urinary tract symptoms: N40.0

## 2022-09-23 HISTORY — DX: Retention of urine, unspecified: R33.9

## 2022-09-23 LAB — URINALYSIS, ROUTINE W REFLEX MICROSCOPIC
Bacteria, UA: NONE SEEN
Bilirubin Urine: NEGATIVE
Glucose, UA: NEGATIVE mg/dL
Hgb urine dipstick: NEGATIVE
Ketones, ur: NEGATIVE mg/dL
Nitrite: NEGATIVE
Protein, ur: NEGATIVE mg/dL
Specific Gravity, Urine: 1.014 (ref 1.005–1.030)
WBC, UA: >50 WBC/hpf (ref 0–5)
pH: 6 (ref 5.0–8.0)

## 2022-09-23 LAB — CBC WITH DIFFERENTIAL/PLATELET
Abs Immature Granulocytes: 0.02 10*3/uL (ref 0.00–0.07)
Basophils Absolute: 0.1 10*3/uL (ref 0.0–0.1)
Basophils Relative: 1 %
Eosinophils Absolute: 0.2 10*3/uL (ref 0.0–0.5)
Eosinophils Relative: 2 %
HCT: 41.7 % (ref 39.0–52.0)
Hemoglobin: 14 g/dL (ref 13.0–17.0)
Immature Granulocytes: 0 %
Lymphocytes Relative: 20 %
Lymphs Abs: 1.5 10*3/uL (ref 0.7–4.0)
MCH: 31.3 pg (ref 26.0–34.0)
MCHC: 33.6 g/dL (ref 30.0–36.0)
MCV: 93.3 fL (ref 80.0–100.0)
Monocytes Absolute: 0.6 10*3/uL (ref 0.1–1.0)
Monocytes Relative: 8 %
Neutro Abs: 5.1 10*3/uL (ref 1.7–7.7)
Neutrophils Relative %: 69 %
Platelets: 269 10*3/uL (ref 150–400)
RBC: 4.47 MIL/uL (ref 4.22–5.81)
RDW: 12.5 % (ref 11.5–15.5)
WBC: 7.4 10*3/uL (ref 4.0–10.5)
nRBC: 0 % (ref 0.0–0.2)

## 2022-09-23 LAB — COMPREHENSIVE METABOLIC PANEL
ALT: 19 U/L (ref 0–44)
AST: 27 U/L (ref 15–41)
Albumin: 4.1 g/dL (ref 3.5–5.0)
Alkaline Phosphatase: 61 U/L (ref 38–126)
Anion gap: 9 (ref 5–15)
BUN: 23 mg/dL (ref 8–23)
CO2: 26 mmol/L (ref 22–32)
Calcium: 9.1 mg/dL (ref 8.9–10.3)
Chloride: 102 mmol/L (ref 98–111)
Creatinine, Ser: 1.22 mg/dL (ref 0.61–1.24)
GFR, Estimated: 58 mL/min — ABNORMAL LOW (ref >60)
Glucose, Bld: 94 mg/dL (ref 70–99)
Potassium: 3.7 mmol/L (ref 3.5–5.1)
Sodium: 137 mmol/L (ref 135–145)
Total Bilirubin: 0.6 mg/dL (ref 0.3–1.2)
Total Protein: 6.7 g/dL (ref 6.5–8.1)

## 2022-09-23 LAB — CBG MONITORING, ED: Glucose-Capillary: 121 mg/dL — ABNORMAL HIGH (ref 70–99)

## 2022-09-23 LAB — MAGNESIUM: Magnesium: 2.2 mg/dL (ref 1.7–2.4)

## 2022-09-23 MED ORDER — MELATONIN 3 MG PO TABS
3.0000 mg | ORAL_TABLET | Freq: Every day | ORAL | Status: DC
  Administered 2022-09-23 – 2022-09-26 (×4): 3 mg via ORAL
  Filled 2022-09-23 (×4): qty 1

## 2022-09-23 MED ORDER — SERTRALINE HCL 50 MG PO TABS
50.0000 mg | ORAL_TABLET | Freq: Every day | ORAL | Status: DC
  Administered 2022-09-23 – 2022-09-26 (×4): 50 mg via ORAL
  Filled 2022-09-23 (×4): qty 1

## 2022-09-23 MED ORDER — HEPARIN SODIUM (PORCINE) 5000 UNIT/ML IJ SOLN
5000.0000 [IU] | Freq: Three times a day (TID) | INTRAMUSCULAR | Status: DC
  Administered 2022-09-23 – 2022-09-27 (×11): 5000 [IU] via SUBCUTANEOUS
  Filled 2022-09-23 (×14): qty 1

## 2022-09-23 MED ORDER — SENNOSIDES-DOCUSATE SODIUM 8.6-50 MG PO TABS: 1.0000 | ORAL_TABLET | Freq: Every evening | ORAL | Status: DC | PRN

## 2022-09-23 MED ORDER — PANCRELIPASE (LIP-PROT-AMYL) 36000-114000 UNITS PO CPEP
36000.0000 [IU] | ORAL_CAPSULE | Freq: Three times a day (TID) | ORAL | Status: DC
  Administered 2022-09-24 – 2022-09-27 (×10): 36000 [IU] via ORAL
  Filled 2022-09-23 (×12): qty 1

## 2022-09-23 MED ORDER — ACETAMINOPHEN 325 MG PO TABS
650.0000 mg | ORAL_TABLET | Freq: Four times a day (QID) | ORAL | Status: DC | PRN
  Administered 2022-09-24 – 2022-09-25 (×3): 650 mg via ORAL
  Filled 2022-09-23 (×4): qty 2

## 2022-09-23 MED ORDER — TRAZODONE HCL 50 MG PO TABS
50.0000 mg | ORAL_TABLET | Freq: Every day | ORAL | Status: DC
  Administered 2022-09-23 – 2022-09-26 (×4): 50 mg via ORAL
  Filled 2022-09-23 (×4): qty 1

## 2022-09-23 MED ORDER — LOSARTAN POTASSIUM 50 MG PO TABS
100.0000 mg | ORAL_TABLET | Freq: Every day | ORAL | Status: DC
  Administered 2022-09-24 – 2022-09-27 (×4): 100 mg via ORAL
  Filled 2022-09-23 (×2): qty 2
  Filled 2022-09-23: qty 4
  Filled 2022-09-23: qty 2

## 2022-09-23 MED ORDER — PRAMIPEXOLE DIHYDROCHLORIDE 1 MG PO TABS
1.0000 mg | ORAL_TABLET | Freq: Every day | ORAL | Status: DC
  Administered 2022-09-23 – 2022-09-26 (×4): 1 mg via ORAL
  Filled 2022-09-23 (×7): qty 1

## 2022-09-23 MED ORDER — ONDANSETRON HCL 4 MG/2ML IJ SOLN: 4.0000 mg | Freq: Four times a day (QID) | INTRAMUSCULAR | Status: DC | PRN

## 2022-09-23 MED ORDER — SODIUM CHLORIDE 0.9% FLUSH
3.0000 mL | Freq: Two times a day (BID) | INTRAVENOUS | Status: DC
  Administered 2022-09-23 – 2022-09-27 (×8): 3 mL via INTRAVENOUS

## 2022-09-23 MED ORDER — ACETAMINOPHEN 650 MG RE SUPP: 650.0000 mg | Freq: Four times a day (QID) | RECTAL | Status: DC | PRN

## 2022-09-23 MED ORDER — PANTOPRAZOLE SODIUM 40 MG PO TBEC
40.0000 mg | DELAYED_RELEASE_TABLET | Freq: Every day | ORAL | Status: DC
  Administered 2022-09-24 – 2022-09-27 (×4): 40 mg via ORAL
  Filled 2022-09-23 (×4): qty 1

## 2022-09-23 MED ORDER — ONDANSETRON HCL 4 MG PO TABS
4.0000 mg | ORAL_TABLET | Freq: Four times a day (QID) | ORAL | Status: DC | PRN
  Administered 2022-09-25 (×2): 4 mg via ORAL
  Filled 2022-09-23 (×2): qty 1

## 2022-09-23 MED ORDER — ASPIRIN 81 MG PO TBEC
81.0000 mg | DELAYED_RELEASE_TABLET | Freq: Every day | ORAL | Status: DC
  Administered 2022-09-24 – 2022-09-27 (×4): 81 mg via ORAL
  Filled 2022-09-23 (×4): qty 1
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
## 2022-09-23 NOTE — ED Notes (Signed)
Pt back in bed and replaced back on cardiac monitoring.
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
## 2022-09-23 NOTE — ED Notes (Signed)
Daughter came out to nurses station, concern for pt's HR of "12", this RN to the bedside, pt's is NSR with HR of 80s. Explained to pt's daughter, daughter concern for "26" advised that is pt's RR and is WNL, this RN did explained all the numbers on the monitor to she would have better understanding, did advised his monitor is linked to the nurses station so that staff can monitor for anything concerning and any alerts, staff will respond accordingly.    Daughter concern for pt "shaking", pt does appears to be shivering, pt able to look at this this RN and respond appropriately. Pt did close his eyes for a minium of 2-3 seconds, when this RN spoke to pt, pt looked right at this RN and was able to verbally respond. Advised daughter that I was updated of pt's status and that pt has a neuro consult pending and that pt is able to respond to nurse appropriately that will we will continue to monitor behavior and vitals are good at current. Daughter then proceed so call this RN a "fucking bitch, you don't care" Attempted to reassure daughter that I do care for the pt's well being and I am here to help in any way I can to provide adequate care for him. Daughter then proceeded to roll her eyes at this RN.  Did replace the call bell on pt's bed over rail, advised that if they had anymore concerns then to please hit the call bell and someone will respond. The daughter then said "I want another fucking nurse, you don't seem to care like the doctor does". This RN politely apologize to the daughter, advised that I am the RN assigned to Mr. Pafford and again attempted to politely advised that this RN will provide any care that is appropriate to pt and will continue to monitor his status and for any changes in his seizure like activities, mental status, change in vitals, etc..Marland KitchenDaughter then proceed to say something else, this RN didn't hear what was said, daughter repeated herself, "just a bitch", this RN gently placed a hand  on daughter's right shoulder and stated "I'm sorry you feel that way, but...," before this RN could provide and reassurance the  daughter proceeded to snap "don't fucking touch me", This RN advised will update the provider and have come assess pt and give any updates", and this RN left the room

## 2022-09-23 NOTE — ED Notes (Signed)
This RN advised daughter that provider was updated on events that occurred and will be in shortly to give update, waiting on neuro consult as well. Pt asked "what did she say", daughter went to repeat the update. This RN asked if pt was hard of hearing or he had hearing aid, daughter responded harshly "yes he does and they are dead, that's why he can't hear you". This RN attempted to kindly asked if they had the charger or if they needed a charger? Daughter then proceeded to snap "don't be condescending, if we did wouldn't you think I would already had done that?" Again, apologize to the daughter, advised was only trying to help, again stated the provider will be around shortly for update.

## 2022-09-23 NOTE — ED Notes (Signed)
Charge RN [place] and house supervisor Joyce [facility]) Joe were made aware of events, they have spoken to pt's daughter.

## 2022-09-23 NOTE — ED Notes (Addendum)
Dr. Mayra Neer update on pt's status and events that took place, at this time no new orders or interventions needed, possible pseudo seizures vs seizure-like activities, advised to continue to monitor pt for any changes. This RN verbalized understanding, provider to update pt and daughter momentary.

## 2022-09-23 NOTE — ED Notes (Signed)
Pt called out, this RN to bedside, pt wanted to report that he had "another shaking episode", this RN noted pt legs twitching, pt is able to lift his leg on command. Pt able to have a full on conversation with this RN, MR. Hibbard complimented this RN on my smile and symmetrical of my face, reports he does self portrait pt also stated back in his younger days, also advised nurse of his brain bleed several years ago and told about the time "he almost died". Pt GCS 15, all this conversation took placed will he endorse he was having "seizures again". MD notified, no orders verbalized.   Daughter not at the bedside, pt is very pleasant and nice when daughter is not in room. Charge RN aware.

## 2022-09-23 NOTE — ED Notes (Signed)
Charge nurse notified security of daughter's [name]) disruptive behavior to staff, security has escorted [name] out the ED for her continued disrespect to this RN and Agricultural consultant.

## 2022-09-23 NOTE — ED Notes (Signed)
Inpatient provider at the bedside to consult/assess pt

## 2022-09-23 NOTE — ED Notes (Signed)
Pt unhooked himself and ambulated to BR independently Pt back to treatment room, placed back on monitoring devices PT given ham sandwich, crackers, and PB, along with cup of water

## 2022-09-23 NOTE — ED Notes (Signed)
Pt unhooked self from monitor and ambulated to BR w/o assist, steady gait noted

## 2022-09-23 NOTE — ED Notes (Signed)
Pt unhooked himself from monitoring device and ambulated to BR with steady gait noted with daughter. Both standing outside bathroom door as someone else is in the BR, this RN went up to pt and daughter, barely placed hand on daughter back and pointed in direction to another BR, advised if they did not want to wait there was another bathroom they may use, daughter through her hands up at this RN and shouted "don't you touch me" again apologize and advised was only trying to pont them in the direction of another restroom that was close. This RN stepped away, notified charge RN [facility] and pt and daughter walked to BR down the hall.   This was witnessed by [place] [employer] who also advised daughter is rude and aggressive to towards this RN.

## 2022-09-23 NOTE — ED Notes (Addendum)
Dr. Mayra Neer at the bedside to speak with pt and daughter

## 2022-09-23 NOTE — ED Triage Notes (Signed)
Pt BIB EMS with c/o seizures but can talk during and focus his eyes on someone during the seizures x2 months. Hx of brain bleed. During the seizure, pt stated "I'm doing okay, I just can't stop my seizures."  BP 150/80 HR 75 RR 18 95% RA
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
## 2022-09-24 ENCOUNTER — Inpatient Hospital Stay (HOSPITAL_COMMUNITY): Payer: Medicare HMO

## 2022-09-24 ENCOUNTER — Encounter (HOSPITAL_COMMUNITY): Payer: Self-pay | Admitting: Internal Medicine

## 2022-09-24 DIAGNOSIS — Z8679 Personal history of other diseases of the circulatory system: Secondary | ICD-10-CM

## 2022-09-24 DIAGNOSIS — F418 Other specified anxiety disorders: Secondary | ICD-10-CM | POA: Diagnosis not present

## 2022-09-24 DIAGNOSIS — Z86711 Personal history of pulmonary embolism: Secondary | ICD-10-CM | POA: Diagnosis not present

## 2022-09-24 DIAGNOSIS — R569 Unspecified convulsions: Secondary | ICD-10-CM | POA: Diagnosis not present

## 2022-09-24 DIAGNOSIS — N1831 Chronic kidney disease, stage 3a: Secondary | ICD-10-CM | POA: Diagnosis not present

## 2022-09-24 LAB — RAPID URINE DRUG SCREEN, HOSP PERFORMED
Amphetamines: NOT DETECTED
Barbiturates: NOT DETECTED
Benzodiazepines: NOT DETECTED
Cocaine: NOT DETECTED
Opiates: NOT DETECTED
Tetrahydrocannabinol: NOT DETECTED

## 2022-09-24 LAB — FERRITIN: Ferritin: 23 ng/mL — ABNORMAL LOW (ref 24–336)

## 2022-09-24 LAB — CBC
HCT: 42.6 % (ref 39.0–52.0)
Hemoglobin: 14.2 g/dL (ref 13.0–17.0)
MCH: 31.1 pg (ref 26.0–34.0)
MCHC: 33.3 g/dL (ref 30.0–36.0)
MCV: 93.4 fL (ref 80.0–100.0)
Platelets: 200 10*3/uL (ref 150–400)
RBC: 4.56 MIL/uL (ref 4.22–5.81)
RDW: 12.7 % (ref 11.5–15.5)
WBC: 5.9 10*3/uL (ref 4.0–10.5)
nRBC: 0 % (ref 0.0–0.2)

## 2022-09-24 LAB — BASIC METABOLIC PANEL
Anion gap: 8 (ref 5–15)
BUN: 21 mg/dL (ref 8–23)
CO2: 26 mmol/L (ref 22–32)
Calcium: 9.1 mg/dL (ref 8.9–10.3)
Chloride: 101 mmol/L (ref 98–111)
Creatinine, Ser: 1.18 mg/dL (ref 0.61–1.24)
GFR, Estimated: >60 mL/min (ref >60)
Glucose, Bld: 107 mg/dL — ABNORMAL HIGH (ref 70–99)
Potassium: 3.4 mmol/L — ABNORMAL LOW (ref 3.5–5.1)
Sodium: 135 mmol/L (ref 135–145)

## 2022-09-24 LAB — TSH: TSH: 4.221 u[IU]/mL (ref 0.350–4.500)

## 2022-09-24 MED ORDER — LIDOCAINE HCL URETHRAL/MUCOSAL 2 % EX GEL
1.0000 | Freq: Once | CUTANEOUS | Status: AC
  Administered 2022-09-24: 1 via URETHRAL
  Filled 2022-09-24: qty 11

## 2022-09-24 MED ORDER — POTASSIUM CHLORIDE 20 MEQ PO PACK
40.0000 meq | PACK | Freq: Once | ORAL | Status: AC
  Administered 2022-09-24: 40 meq via ORAL
  Filled 2022-09-24: qty 2

## 2022-09-24 MED ORDER — DICLOFENAC SODIUM 1 % EX GEL
4.0000 g | Freq: Once | CUTANEOUS | Status: AC
  Administered 2022-09-24: 4 g via TOPICAL
  Filled 2022-09-24: qty 100

## 2022-09-24 MED ORDER — POTASSIUM CHLORIDE CRYS ER 20 MEQ PO TBCR
40.0000 meq | EXTENDED_RELEASE_TABLET | Freq: Once | ORAL | Status: AC
  Administered 2022-09-24: 40 meq via ORAL
  Filled 2022-09-24: qty 2

## 2022-09-24 NOTE — ED Notes (Signed)
Order states Bladder scan;Catherize Prn If Unable To Void;If Cath X 3 Or Residual >300 Place Foley. At this time Dr. Posey Pronto to In & Out cath

## 2022-09-24 NOTE — ED Notes (Signed)
Pt up to BR again, reports cannot urinate in urinal, reports feels pressure, would like to be cath, advised will need to scan his bladder first before urinary catheter, pt verbalized understanding. Will notified attending.

## 2022-09-24 NOTE — Progress Notes (Signed)
LTM EEG to be placed on day shift. Overnight tech will not have time.

## 2022-09-24 NOTE — ED Notes (Addendum)
This RN attempted to straight cath pt with Samuel Sloan at the bedside for assitance, unable to cath pt d/t his enlarged prostate (Hx of BPH) , pt reports has had this in the past with urinary retention and trouble cathing , he may need to be cath with a coude....also he has some sores around the corona, pt is unaware of those, they are red and some slight bleeding.

## 2022-09-24 NOTE — Plan of Care (Signed)
  Problem: Nutrition: Goal: Adequate nutrition will be maintained Outcome: Progressing   Problem: Pain Managment: Goal: General experience of comfort will improve Outcome: Progressing   Problem: Coping: Goal: Level of anxiety will decrease Outcome: Not Progressing
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
## 2022-09-24 NOTE — Progress Notes (Addendum)
       Overnight   NAME: Samuel Sloan MRN: [id number] DOB : 23-Nov-1933    Date of Service   09/24/2022   HPI/Events of Note   Notified by RN for Increasing pain due to inability to urinate. At bedside visit, patient is in obvious discomfort.  He states that he has had to be urinary catheterized before and there is occasional difficulty when this occurs.  Elected a 86fr coude' catheter and sterile field technique. Lidocaine was used as well for insertion.  Area cleaned and Lidocaine allowed to dwell for short time  Some minimal blood was noted from insertion prior/current attempt(s).  Single attempt (this time) with slight difficulty yielded an initial output of 675 mL.    Patient expresses immediate relief.  Urine flow is clear/yellow   Interventions/ Plan   Foley to remain in place for now. Maintain hygiene  Do not remove without Attending order.      Gershon Cull BSN MSNA MSN ACNPC-AG Acute Care Nurse Practitioner [place]
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
## 2022-09-24 NOTE — Progress Notes (Signed)
Pt was had audible load moaning and while entering the room I noticed pt had full body shakes with legs lifting off of the bed. Pt was able to make eye contact with me and have appropriate conversation while still continuing with this episode. VSS and recorded, Pupils ERR, Dr. Rory Percy messaged, red button pressed for LTM notification. Episode lasted approximately 5 -10 minutes after first noticed. Episode seemed to have pauses that varied in time. Will continue to monitor and pass on information to next shift. [place]

## 2022-09-24 NOTE — ED Notes (Signed)
Pt appears to be comfortable and resting, can observe even RR that are unlabored, pt remains on cardiac monitoring devices, no changes noted, side rails up x2 for safety, NAD noted, plan of care ongoing, call light within reach, no further concerns as of present.
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
## 2022-09-24 NOTE — ED Notes (Signed)
Pt placed back on cardiac monitoring, pt reports his legs feels better standing and when dangling off the bed, side rail down x1, pt is steady on his feet, advised he may sit on side of the bed and may stand as needed and ambulate on one side of bed, long as he keeps his cardiac monitor on, pt verbalized understanding, pt given a urinal as well, pt thankful.

## 2022-09-24 NOTE — ED Notes (Signed)
Pt up to BR again, unhooks self from monitoring devices

## 2022-09-24 NOTE — ED Notes (Signed)
Bladder scan pt 551 pt was c\o of pain when having to press down on his stomach to find his bladder explained to pt I had to do that. RN is aware

## 2022-09-24 NOTE — ED Notes (Signed)
NP Samuel Sloan at the bedside, verbal order for lidocaine (XYLOCAINE) 2 % jelly, foley catheter with 16Fr. Cou[place]. NP to attempt to place foley catheter.

## 2022-09-24 NOTE — Progress Notes (Signed)
EEG LTM hooked up. Non MRI leads. [facility] is monitoring and test button was pressed.

## 2022-09-24 NOTE — ED Notes (Signed)
Pt shaking and humming, when asked pt if he was okay, pt stated "I'm so damn cold". Pt given two extra warm blankets for warmth. Pt grateful, No other needs or concerns at current.

## 2022-09-24 NOTE — ED Notes (Signed)
I helped patient back to the bed and clean bedside table and empty foley cath.  I wash patient bottom and legs because he has blood leaking from foley.  I made nurse aware of the blood leaking.

## 2022-09-24 NOTE — ED Notes (Signed)
Neurologist at the bedside 

## 2022-09-24 NOTE — ED Notes (Addendum)
Unable to insert catheter x2. Attempted 16 fr coude cath and a 12 fr straight cath.

## 2022-09-25 DIAGNOSIS — R569 Unspecified convulsions: Secondary | ICD-10-CM | POA: Diagnosis not present

## 2022-09-25 DIAGNOSIS — Z515 Encounter for palliative care: Secondary | ICD-10-CM | POA: Diagnosis not present

## 2022-09-25 DIAGNOSIS — F308 Other manic episodes: Secondary | ICD-10-CM | POA: Insufficient documentation

## 2022-09-25 DIAGNOSIS — S069X9S Unspecified intracranial injury with loss of consciousness of unspecified duration, sequela: Secondary | ICD-10-CM | POA: Diagnosis not present

## 2022-09-25 DIAGNOSIS — Z86711 Personal history of pulmonary embolism: Secondary | ICD-10-CM | POA: Diagnosis not present

## 2022-09-25 DIAGNOSIS — G2581 Restless legs syndrome: Secondary | ICD-10-CM | POA: Diagnosis not present

## 2022-09-25 DIAGNOSIS — Z7189 Other specified counseling: Secondary | ICD-10-CM | POA: Diagnosis not present

## 2022-09-25 DIAGNOSIS — N1831 Chronic kidney disease, stage 3a: Secondary | ICD-10-CM | POA: Diagnosis not present

## 2022-09-25 DIAGNOSIS — G4733 Obstructive sleep apnea (adult) (pediatric): Secondary | ICD-10-CM

## 2022-09-25 LAB — BASIC METABOLIC PANEL
Anion gap: 6 (ref 5–15)
BUN: 19 mg/dL (ref 8–23)
CO2: 28 mmol/L (ref 22–32)
Calcium: 9.2 mg/dL (ref 8.9–10.3)
Chloride: 100 mmol/L (ref 98–111)
Creatinine, Ser: 1.34 mg/dL — ABNORMAL HIGH (ref 0.61–1.24)
GFR, Estimated: 52 mL/min — ABNORMAL LOW (ref >60)
Glucose, Bld: 103 mg/dL — ABNORMAL HIGH (ref 70–99)
Potassium: 3.9 mmol/L (ref 3.5–5.1)
Sodium: 134 mmol/L — ABNORMAL LOW (ref 135–145)

## 2022-09-25 LAB — MAGNESIUM: Magnesium: 2 mg/dL (ref 1.7–2.4)

## 2022-09-25 MED ORDER — LOPERAMIDE HCL 2 MG PO CAPS
2.0000 mg | ORAL_CAPSULE | Freq: Three times a day (TID) | ORAL | Status: DC | PRN
  Administered 2022-09-25 (×2): 2 mg via ORAL
  Filled 2022-09-25 (×2): qty 1

## 2022-09-25 MED ORDER — BUSPIRONE HCL 10 MG PO TABS
10.0000 mg | ORAL_TABLET | Freq: Once | ORAL | Status: AC
  Administered 2022-09-25: 10 mg via ORAL
  Filled 2022-09-25: qty 1

## 2022-09-25 MED ORDER — DICLOFENAC SODIUM 1 % EX GEL
4.0000 g | Freq: Four times a day (QID) | CUTANEOUS | Status: DC | PRN
  Administered 2022-09-25 (×2): 4 g via TOPICAL

## 2022-09-25 MED ORDER — CHLORHEXIDINE GLUCONATE CLOTH 2 % EX PADS
6.0000 | MEDICATED_PAD | Freq: Every day | CUTANEOUS | Status: DC
  Administered 2022-09-25 – 2022-09-27 (×3): 6 via TOPICAL

## 2022-09-25 MED ORDER — LAMOTRIGINE 25 MG PO TABS
25.0000 mg | ORAL_TABLET | Freq: Every day | ORAL | Status: DC
  Administered 2022-09-25 – 2022-09-26 (×2): 25 mg via ORAL
  Filled 2022-09-25 (×2): qty 1

## 2022-09-25 NOTE — Procedures (Addendum)
Patient Name: Samuel Sloan  MRN: [id number]  Epilepsy Attending: [name]  Referring Physician/Provider: Amie Portland, MD  Duration: 09/24/2022 1547 to 09/25/2022 1152  Patient history: 89yo M with seizure like episodes. EEG to evaluate for seizure.  Level of alertness: Awake, asleep  AEDs during EEG study: None  Technical aspects: This EEG study was done with scalp electrodes positioned according to the 10-20 International system of electrode placement. Electrical activity was reviewed with band pass filter of 1-70Hz , sensitivity of 7 uV/mm, display speed of 73mm/sec with a 60Hz  notched filter applied as appropriate. EEG data were recorded continuously and digitally stored.  Video monitoring was available and reviewed as appropriate.  Description: The posterior dominant rhythm consists of 9 Hz activity of moderate voltage (25-35 uV) seen predominantly in posterior head regions, symmetric and reactive to eye opening and eye closing. Sleep was characterized by vertex waves, sleep spindles (12 to 14 Hz), maximal frontocentral region.    Event button was pressed on 09/24/2022 at [place] for whole body shaking.  Concomitant EEG before, during and after the event did not show any EEG changes suggest seizure.  One episodes of head shaking was noted on 09/24/2022 at 2147.  Concomitant EEG before, during and after the event did not show any EEG change to suggest seizure.  Hyperventilation and photic stimulation were not performed.     IMPRESSION: This study is within normal limits. No seizures or epileptiform discharges were seen throughout the recording.  Event button was pressed on 09/24/2022 at 1832 for whole body shaking without concomitant EEG change.  This was a NON-epileptic event.  One episode of head shaking was noted on 09/24/2022 at 2147 without concomitant EEG change. This was a NON-epileptic event.  [name]

## 2022-09-25 NOTE — Evaluation (Signed)
Physical Therapy Evaluation Patient Details Name: Samuel Sloan MRN: [id number] DOB: [birth year] Today's Date: 09/25/2022  History of Present Illness  89 y.o. male presents to [facility] hospital on 09/23/2022 with concern for seizure-like activity. PMH includes R SDH s/p evacuation with resultant cognitive impairment, recurrent PE, HTN, CKD III, RLS, BPH, OSA.  Clinical Impression  Pt presents to PT with deficits in cognition, gait, strength, power, balance. Pt reports L knee pain upon PT arrival, PT notes mild swelling to vastus lateralis, tender to touch. With gentle AROM pt reports improvement in pain and strength. Pt is able to ambulate with support of an assistive device at this time with good stability, PT anticipates pt will progress back to mobility baseline very quickly. PT does not recommend any post-acute PT services at this time. Acute PT will continue to follow in an effort to return to ambulating without DME.       Recommendations for follow up therapy are one component of a multi-disciplinary discharge planning process, led by the attending physician.  Recommendations may be updated based on patient status, additional functional criteria and insurance authorization.  Follow Up Recommendations No PT follow up      Assistance Recommended at Discharge PRN  Patient can return home with the following  Assistance with cooking/housework;Direct supervision/assist for medications management;Direct supervision/assist for financial management;Help with stairs or ramp for entrance    Equipment Recommendations Rolling walker (2 wheels) (pt declines possibility of rollator)  Recommendations for Other Services       Functional Status Assessment Patient has had a recent decline in their functional status and demonstrates the ability to make significant improvements in function in a reasonable and predictable amount of time.     Precautions / Restrictions Precautions Precautions:  Fall Restrictions Weight Bearing Restrictions: No      Mobility  Bed Mobility Overal bed mobility: Modified Independent                  Transfers Overall transfer level: Needs assistance Equipment used: None Transfers: Sit to/from Stand Sit to Stand: Supervision                Ambulation/Gait Ambulation/Gait assistance: Modified independent (Device/Increase time), Min guard Gait Distance (Feet): 250 Feet (initial 10' without device, minG, pt reaching for UE support. Final 250' with RW, good stability and steady step-through pattern) Assistive device: Rolling walker (2 wheels) Gait Pattern/deviations: Step-through pattern Gait velocity: functional Gait velocity interpretation: 1.31 - 2.62 ft/sec, indicative of limited community ambulator   General Gait Details: steady step-through gait with RW, pt hesitant to release RW at end of session despite great improvement in pain and gait quality with increased ambulation distance  Stairs            Wheelchair Mobility    Modified Rankin (Stroke Patients Only)       Balance Overall balance assessment: Needs assistance Sitting-balance support: No upper extremity supported, Feet supported Sitting balance-Leahy Scale: Good     Standing balance support: Single extremity supported, Reliant on assistive device for balance Standing balance-Leahy Scale: Poor                               Pertinent Vitals/Pain Pain Assessment Pain Assessment: Faces Faces Pain Scale: Hurts little more Pain Location: L knee Pain Descriptors / Indicators: Sore Pain Intervention(s): Monitored during session    Home Living Family/patient expects to be discharged to:: Other (Comment) ([facility]) Living Arrangements: Alone;Other (Comment) (ILF) Available Help at Discharge: Family;Available PRN/intermittently (daughter PRN) Type of Home: Independent living facility Home Access: Level entry        Home Layout: One level Home Equipment: None      Prior Function Prior Level of Function : Needs assist  Cognitive Assist : ADLs (cognitive)           Mobility Comments: pt ambulates independently, does report chronic balance deficits from prior brain injury ADLs Comments: daughter assists with medication management, finances, and intermittent with transportation. Pt reports intermittently riding public transport. Pt performs his own ADLs. Facility provides all meals     Hand Dominance        Extremity/Trunk Assessment   Upper Extremity Assessment Upper Extremity Assessment: Overall WFL for tasks assessed    Lower Extremity Assessment Lower Extremity Assessment: LLE deficits/detail LLE Deficits / Details: pt reports stiffness and pain initially. PT provides AAROM with full ROM, PT does notes mild swelling and tightness of vastus lateralis near insertion site. Pt reports improved comfort and strength with progressive AROM, denies pain by end of session    Cervical / Trunk Assessment Cervical / Trunk Assessment: Normal  Communication   Communication: Expressive difficulties;HOH (pt reports this is baseline from prior BI, cochlear implant)  Cognition Arousal/Alertness: Awake/alert Behavior During Therapy: WFL for tasks assessed/performed Overall Cognitive Status: History of cognitive impairments - at baseline                                 General Comments: pt reports very poor short term memory, poor attention span at baseline.        General Comments General comments (skin integrity, edema, etc.): VSS on RA, no temor or jerking movements noted during session    Exercises     Assessment/Plan    PT Assessment Patient needs continued PT services  PT Problem List Decreased strength;Decreased activity tolerance;Decreased balance;Decreased mobility;Decreased cognition;Decreased knowledge of use of DME;Decreased safety awareness;Decreased knowledge of  precautions       PT Treatment Interventions DME instruction;Gait training;Functional mobility training;Therapeutic activities;Therapeutic exercise;Balance training;Neuromuscular re-education;Patient/family education    PT Goals (Current goals can be found in the Care Plan section)  Acute Rehab PT Goals Patient Stated Goal: to return to prior level of function PT Goal Formulation: With patient Time For Goal Achievement: 10/09/22 Potential to Achieve Goals: Good    Frequency Min 3X/week     Co-evaluation               AM-PAC PT "6 Clicks" Mobility  Outcome Measure Help needed turning from your back to your side while in a flat bed without using bedrails?: None Help needed moving from lying on your back to sitting on the side of a flat bed without using bedrails?: None Help needed moving to and from a bed to a chair (including a wheelchair)?: A Little Help needed standing up from a chair using your arms (e.g., wheelchair or bedside chair)?: A Little Help needed to walk in hospital room?: A Little Help needed climbing 3-5 steps with a railing? : A Little 6 Click Score: 20    End of Session   Activity Tolerance: Patient tolerated treatment well Patient left: in chair;with call bell/phone within reach;with chair alarm set Nurse Communication: Mobility status PT Visit Diagnosis: Other abnormalities of gait and mobility (R26.89);Pain;Muscle weakness (generalized) (M62.81) Pain - Right/Left: Left Pain - part of body: Knee  Time: 1441-1510 PT Time Calculation (min) (ACUTE ONLY): 29 min   Charges:   PT Evaluation $PT Eval Low Complexity: [place], PT, DPT Acute Rehabilitation Office [place] 09/25/2022, 3:25 PM

## 2022-09-25 NOTE — Progress Notes (Signed)
Patient not available at moment to discontinue  EEG. Having meeting with providers.

## 2022-09-25 NOTE — Progress Notes (Signed)
LTM EEG discontinued - no skin breakdown at unhook.   
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
## 2022-09-25 NOTE — Progress Notes (Addendum)
Subjective: States he had 1 episode yesterday which was more intense compared to his previous episodes.  He was feeling normal prior to the episode but then suddenly felt grief about different issues in his life (divorce from his wife after 1 years, multiple medical issues impacting his ability to live independently and drive, 87 year old son committed suicide) and then had whole body shaking.  Also reports pain in left knee which is affecting his ability to walk now.  Denies any paresthesias, bowel/bladder incontinence  ROS: negative except above  Examination  Vital signs in last 24 hours: Temp:  [97.4 F (36.3 C)-98.4 F (36.9 C)] 97.8 F (36.6 C) (02/07 0807) Pulse Rate:  [64-74] 65 (02/07 0807) Resp:  [14-25] 18 (02/07 0807) BP: (137-171)/(70-119) 137/86 (02/07 0807) SpO2:  [92 %-100 %] 94 % (02/07 0807) Weight:  [76.7 kg] 76.7 kg (02/07 0900)  General: lying in bed, NAD Neuro: AOx3, cranial nerves II to XII grossly intact, 5/5 in bilateral upper extremities, 5/5 in right lower extremity, 2/5 in left lower extremity somewhat limited due to lack of effort (positive Hoovers sign)  Basic Metabolic Panel: Recent Labs  Lab 09/23/22 1529 09/24/22 0456 09/25/22 0552  NA 137 135 134*  K 3.7 3.4* 3.9  CL 102 101 100  CO2 26 26 28   GLUCOSE 94 107* 103*  BUN 23 21 19   CREATININE 1.22 1.18 1.34*  CALCIUM 9.1 9.1 9.2  MG 2.2  --  2.0    CBC: Recent Labs  Lab 09/23/22 1529 09/24/22 0456  WBC 7.4 5.9  NEUTROABS 5.1  --   HGB 14.0 14.2  HCT 41.7 42.6  MCV 93.3 93.4  PLT 269 200    Coagulation Studies: No results for input(s): "LABPROT", "INR" in the last 72 hours.  Imaging CT head without contrast 09/23/2022: No acute intracranial abnormalities.   ASSESSMENT AND PLAN: 89 year old male presented with full body shaking episodes consistent with pseudoseizures/nonepileptic events.  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  He agrees with the diagnosis and is interested in speaking with psychiatry and cognitive behavioral therapy -DC LTM EEG -Does not need to be on any AEDs.  -Discussed with RN and Dr. Starla Link to avoid administering benzos for these episodes unless it is affecting vital signs. -Would recommend no driving till these episodes are controlled -Patient states he will not be able to return to his assisted living facility while these episodes are happening.  Will update case manager -Psychiatry consult for grief and getting him started for outpatient cognitive behavioral therapy  Left knee pain with leg weakness -No red flags concerning for spinal issues.  Will sign was positive.  Therefore could be functional weakness as well as limited movement due to knee pain -PT/OT for now -If concern persist, can pursue MRI T and L-spine imaging to rule out stenosis, cord compression -Discussed plan with Dr. Starla Link  Thank you for allowing [place] to persuade in the care of this patient.  Neurology will sign off.  Please do not hesitate to contact us for any further questions.   Zeb Comfort Epilepsy [facility] For questions after 5pm please refer to AMION to reach the Neurologist on call
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
## 2022-09-25 NOTE — Plan of Care (Signed)
  Problem: Clinical Measurements: Goal: Diagnostic test results will improve Outcome: Progressing   Problem: Nutrition: Goal: Adequate nutrition will be maintained Outcome: Progressing   Problem: Education: Goal: Knowledge of General Education information will improve Description: Including pain rating scale, medication(s)/side effects and non-pharmacologic comfort measures Outcome: Not Progressing   Problem: Elimination: Goal: Will not experience complications related to urinary retention Outcome: Not Progressing

## 2022-09-26 ENCOUNTER — Other Ambulatory Visit: Payer: Self-pay

## 2022-09-26 ENCOUNTER — Encounter (HOSPITAL_COMMUNITY): Payer: Self-pay | Admitting: Internal Medicine

## 2022-09-26 DIAGNOSIS — G2581 Restless legs syndrome: Secondary | ICD-10-CM | POA: Diagnosis not present

## 2022-09-26 DIAGNOSIS — Z66 Do not resuscitate: Secondary | ICD-10-CM

## 2022-09-26 DIAGNOSIS — Z7189 Other specified counseling: Secondary | ICD-10-CM | POA: Diagnosis not present

## 2022-09-26 DIAGNOSIS — R569 Unspecified convulsions: Secondary | ICD-10-CM | POA: Diagnosis not present

## 2022-09-26 DIAGNOSIS — Z515 Encounter for palliative care: Secondary | ICD-10-CM | POA: Diagnosis not present

## 2022-09-26 DIAGNOSIS — Z8679 Personal history of other diseases of the circulatory system: Secondary | ICD-10-CM | POA: Diagnosis not present

## 2022-09-26 DIAGNOSIS — R001 Bradycardia, unspecified: Secondary | ICD-10-CM

## 2022-09-26 DIAGNOSIS — N1831 Chronic kidney disease, stage 3a: Secondary | ICD-10-CM | POA: Diagnosis not present

## 2022-09-26 LAB — BASIC METABOLIC PANEL
Anion gap: 9 (ref 5–15)
BUN: 24 mg/dL — ABNORMAL HIGH (ref 8–23)
CO2: 29 mmol/L (ref 22–32)
Calcium: 9.4 mg/dL (ref 8.9–10.3)
Chloride: 96 mmol/L — ABNORMAL LOW (ref 98–111)
Creatinine, Ser: 1.47 mg/dL — ABNORMAL HIGH (ref 0.61–1.24)
GFR, Estimated: 46 mL/min — ABNORMAL LOW (ref >60)
Glucose, Bld: 105 mg/dL — ABNORMAL HIGH (ref 70–99)
Potassium: 4.7 mmol/L (ref 3.5–5.1)
Sodium: 134 mmol/L — ABNORMAL LOW (ref 135–145)

## 2022-09-26 LAB — MAGNESIUM: Magnesium: 2.1 mg/dL (ref 1.7–2.4)

## 2022-09-26 NOTE — Evaluation (Signed)
Occupational Therapy Evaluation Patient Details Name: Samuel Sloan MRN: [id number] DOB: [birth year] Today's Date: 09/26/2022   History of Present Illness 89 y.o. male presents to [facility] hospital on 09/23/2022 with concern for seizure-like activity. Being followed by psych. PMH includes R SDH s/p evacuation with resultant cognitive impairment, recurrent PE, HTN, CKD III, RLS, BPH, OSA.   Clinical Impression   PTA pt lives alone at [facility]. At baseline, he ambulates without an AD and is very active. He has significant memory and executive level skill deficits at baseline from a previous head injury.  Daughter assists with transportation and financial and medication management. Samuel Sloan is able to complete ADL and functional mobility @ modified independent level. Initially using a RW, for L knee pain,which he attributes to "flailing around" during his "jerking". AFter walking @ 100 ft, pt no longer used the RW. Recommend pt return to his familiar ILF with HHOT to work on compensatory strategies in the home as well as coping skills to increase independence and safety at the [place]. All further OT can be addressed at the [place]. Discussed with CM.      Recommendations for follow up therapy are one component of a multi-disciplinary discharge planning process, led by the attending physician.  Recommendations may be updated based on patient status, additional functional criteria and insurance authorization.   Follow Up Recommendations  Home health OT     Assistance Recommended at Discharge Intermittent Supervision/Assistance  Patient can return home with the following Direct supervision/assist for financial management;Direct supervision/assist for medications management;Assist for transportation    Functional Status Assessment  Patient has not had a recent decline in their functional status  Equipment Recommendations  None recommended by OT    Recommendations for Other Services        Precautions / Restrictions Precautions Precautions: Fall Restrictions Weight Bearing Restrictions: No      Mobility Bed Mobility Overal bed mobility: Modified Independent                  Transfers Overall transfer level: Modified independent Equipment used: None, Rolling walker (2 wheels)                      Balance Overall balance assessment: Needs assistance Sitting-balance support: No upper extremity supported, Feet supported Sitting balance-Leahy Scale: Good       Standing balance-Leahy Scale: Fair                             ADL either performed or assessed with clinical judgement   ADL Overall ADL's : At baseline                                             Vision Baseline Vision/History: 1 Wears glasses Additional Comments: at baseline     Perception     Praxis      Pertinent Vitals/Pain Pain Assessment Pain Assessment: Faces Faces Pain Scale: Hurts little more Pain Location: L knee Pain Descriptors / Indicators: Sore Pain Intervention(s): Limited activity within patient's tolerance     Hand Dominance Right   Extremity/Trunk Assessment Upper Extremity Assessment Upper Extremity Assessment: Overall WFL for tasks assessed   Lower Extremity Assessment Lower Extremity Assessment: LLE deficits/detail LLE Deficits / Details: pt reports stiffness and pain initially.L knee  Cervical / Trunk  Assessment Cervical / Trunk Assessment: Normal   Communication Communication Communication: HOH (pt reports this is baseline from prior BI, cochlear implant)   Cognition Arousal/Alertness: Awake/alert Behavior During Therapy: WFL for tasks assessed/performed Overall Cognitive Status: History of cognitive impairments - at baseline                                 General Comments: pt reports very poor short term memory, poor attention span at baseline; easily distracted; tangential; decreased  executive level skills - most likely baseline; enjoys humor; very social     General Comments       Exercises     Shoulder Instructions      Home Living Family/patient expects to be discharged to:: Other (Comment) ([facility]) Living Arrangements: Alone;Other (Comment) (ILF) Available Help at Discharge: Family;Available PRN/intermittently (daughter PRN) Type of Home: Independent living facility Home Access: Level entry     Home Layout: One level     Bathroom Shower/Tub: Occupational psychologist: Handicapped height Bathroom Accessibility: Yes   Home Equipment: None          Prior Functioning/Environment Prior Level of Function : Needs assist  Cognitive Assist : ADLs (cognitive)           Mobility Comments: pt ambulates independently, does report chronic balance deficits from prior brain injury ADLs Comments: daughter assists with medication management, finances, and intermittent with transportation. Pt reports intermittently riding public transport. Pt performs his own ADLs. Facility provides all meals        OT Problem List: Impaired balance (sitting and/or standing);Decreased safety awareness;Decreased cognition;Pain      OT Treatment/Interventions: Self-care/ADL training;Therapeutic activities;Cognitive remediation/compensation;Patient/family education;Balance training    OT Goals(Current goals can be found in the care plan section) Acute Rehab OT Goals Patient Stated Goal: to find out what is causing his jerking movements OT Goal Formulation: With patient Time For Goal Achievement: 10/10/22 Potential to Achieve Goals: Good  OT Frequency: Min 2X/week    Co-evaluation              AM-PAC OT "6 Clicks" Daily Activity     Outcome Measure Help from another person eating meals?: None Help from another person taking care of personal grooming?: None Help from another person toileting, which includes using toliet, bedpan,  or urinal?: None Help from another person bathing (including washing, rinsing, drying)?: None Help from another person to put on and taking off regular upper body clothing?: None Help from another person to put on and taking off regular lower body clothing?: None 6 Click Score: 24   End of Session Equipment Utilized During Treatment: Gait belt;Rolling walker (2 wheels) Nurse Communication: Mobility status  Activity Tolerance: Patient tolerated treatment well Patient left: in bed;with call bell/phone within reach;with bed alarm set (per pt request)  OT Visit Diagnosis: Unsteadiness on feet (R26.81);Pain;Other symptoms and signs involving cognitive function Pain - Right/Left: Left Pain - part of body: Knee                Time: 1610-9604 OT Time Calculation (min): 29 min Charges:  OT General Charges $OT Visit: 1 Visit OT Evaluation $OT Eval Moderate Complexity: 1 Mod OT Treatments $Self Care/Home Management : 8-22 mins  Maurie Boettcher, OT/L   Acute OT Clinical Specialist Acute Rehabilitation Services Pager [phone] Office [phone]   [facility] 09/26/2022, 10:27 AM

## 2022-09-26 NOTE — Plan of Care (Signed)

## 2022-09-26 NOTE — Care Management Important Message (Signed)
Important Message  Patient Details  Name: Samuel Sloan MRN: [id number] Date of Birth: 07/07/1934   Medicare Important Message Given:  Yes     Kiffany Schelling Montine Circle 09/26/2022, 1:55 PM
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
## 2022-09-26 NOTE — Progress Notes (Signed)
This chaplain responded to RN's consult for spiritual care in the context of the Pt. decision to transition to DNR.  The Pt. is awake and conversational with the chaplain. The chaplain began rapport building through reflective listening.  The Pt. chooses not to talk about this admission with the chaplain.    The chaplain understands the Pt. is purposeful in sharing God's story in his life through the "love" he attempts to share with others. The chaplain notices during the time together the Pt. is watching for an affirming response from the chaplain as he moves through his storytelling.  The Pt. is waiting for his daughter to visit after 5 today and is appreciative of the spiritual care visit.  Chaplain Sallyanne Kuster [phone]
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
## 2022-09-27 DIAGNOSIS — G2581 Restless legs syndrome: Secondary | ICD-10-CM | POA: Diagnosis not present

## 2022-09-27 DIAGNOSIS — R569 Unspecified convulsions: Secondary | ICD-10-CM | POA: Diagnosis not present

## 2022-09-27 DIAGNOSIS — F418 Other specified anxiety disorders: Secondary | ICD-10-CM | POA: Diagnosis not present

## 2022-09-27 DIAGNOSIS — N1831 Chronic kidney disease, stage 3a: Secondary | ICD-10-CM | POA: Diagnosis not present

## 2022-09-27 MED ORDER — LAMOTRIGINE 25 MG PO TABS: 25.0000 mg | ORAL_TABLET | Freq: Every day | ORAL | 0 refills | Status: DC

## 2022-09-27 MED ORDER — DIVALPROEX SODIUM ER 250 MG PO TB24
250.0000 mg | ORAL_TABLET | Freq: Every day | ORAL | Status: DC
  Filled 2022-09-27: qty 1

## 2022-09-27 MED ORDER — DIVALPROEX SODIUM ER 250 MG PO TB24: 250.0000 mg | ORAL_TABLET | Freq: Every day | ORAL | 0 refills | Status: DC

## 2022-09-27 MED ORDER — LORATADINE 10 MG PO TABS
10.0000 mg | ORAL_TABLET | Freq: Once | ORAL | Status: AC
  Administered 2022-09-27: 10 mg via ORAL
  Filled 2022-09-27: qty 1

## 2022-09-27 MED ORDER — DIPHENHYDRAMINE HCL 25 MG PO CAPS
25.0000 mg | ORAL_CAPSULE | Freq: Once | ORAL | Status: AC
  Administered 2022-09-27: 25 mg via ORAL
  Filled 2022-09-27: qty 1

## 2022-09-27 MED ORDER — LORATADINE 10 MG PO TABS: 10.0000 mg | ORAL_TABLET | Freq: Every day | ORAL | Status: DC

## 2022-09-27 MED ORDER — LORATADINE 10 MG PO TABS: 10.0000 mg | ORAL_TABLET | Freq: Every day | ORAL | Status: DC | PRN

## 2022-09-27 MED ORDER — DICLOFENAC SODIUM 1 % EX GEL: 4.0000 g | Freq: Four times a day (QID) | CUTANEOUS | 0 refills | Status: DC | PRN

## 2022-09-27 NOTE — TOC Transition Note (Signed)
Transition of Care [facility]) - CM/SW Discharge Note   Patient Details  Name: Samuel Sloan MRN: [id number] Date of Birth: 07/23/1934  [facility] CM/SW Contact:  Geralynn Ochs, LCSW Phone Number: 09/27/2022, 2:37 PM   Clinical Narrative:   Patient discharging home with home health care. Orders for home health OT and SLP sent to [facility], they will resume services with patient. Information for [facility] placed on patient's AVS. Per psychiatry, Mariano Colon may assist with telehealth therapy; information placed on AVS. Daughter requested that information for mobile crisis be placed on patient's AVS so he has the number available, CSW placed information for mobile crisis on AVS. CSW also placed information for [facility] on patient's AVS for any additional as needed assistance for referrals for community supports. CSW updated daughter, [name], she is in agreement and will provide transport for patient back to [place]. CSW notified RN. No further TOC needs at this time.    Final next level of care: Home w Home Health Services Barriers to Discharge: Barriers Resolved   Patient Goals and CMS Choice CMS Medicare.gov Compare Post Acute Care list provided to:: Patient Choice offered to / list presented to : Patient, Adult Children  Discharge Placement                  Patient to be transferred to facility by: Family Name of family member notified: [name] Patient and family notified of of transfer: 09/27/22  Discharge Plan and Services Additional resources added to the After Visit Summary for       Post Acute Care Choice: Home Health            DME Agency: [employer] Date DME Agency Contacted: 09/26/22   Representative spoke with at DME Agency: Cyril Mourning HH Arranged: OT, Speech Therapy [place] Agency: Other - See comment Secondary school teacher) Date [place]: 09/27/22   Representative spoke with at [place]:  [place] ([place]) Interventions SDOH Screenings   Food Insecurity: No Food Insecurity (09/26/2022)  Housing: Low Risk  (09/26/2022)  Transportation Needs: Unmet Transportation Needs (09/26/2022)  Utilities: Not At Risk (09/26/2022)  Tobacco Use: Low Risk  (09/26/2022)     Readmission Risk Interventions     No data to display

## 2022-09-27 NOTE — Plan of Care (Signed)
VeRN reviewed AVS with [name] ( the pt daughter  and caregiver ). Her questions were addressed and supportive education given .  Problem: Education: Goal: Knowledge of General Education information will improve Description: Including pain rating scale, medication(s)/side effects and non-pharmacologic comfort measures Outcome: Adequate for Discharge   Problem: Health Behavior/Discharge Planning: Goal: Ability to manage health-related needs will improve Outcome: Adequate for Discharge   Problem: Clinical Measurements: Goal: Ability to maintain clinical measurements within normal limits will improve Outcome: Adequate for Discharge Goal: Will remain free from infection Outcome: Adequate for Discharge Goal: Diagnostic test results will improve Outcome: Adequate for Discharge Goal: Respiratory complications will improve Outcome: Adequate for Discharge Goal: Cardiovascular complication will be avoided Outcome: Adequate for Discharge   Problem: Activity: Goal: Risk for activity intolerance will decrease Outcome: Adequate for Discharge   Problem: Nutrition: Goal: Adequate nutrition will be maintained Outcome: Adequate for Discharge   Problem: Coping: Goal: Level of anxiety will decrease Outcome: Adequate for Discharge   Problem: Elimination: Goal: Will not experience complications related to bowel motility Outcome: Adequate for Discharge Goal: Will not experience complications related to urinary retention Outcome: Adequate for Discharge   Problem: Pain Managment: Goal: General experience of comfort will improve Outcome: Adequate for Discharge   Problem: Safety: Goal: Ability to remain free from injury will improve Outcome: Adequate for Discharge   Problem: Skin Integrity: Goal: Risk for impaired skin integrity will decrease Outcome: Adequate for Discharge   Problem: Acute Rehab PT Goals(only PT should resolve) Goal: Pt Will Go Supine/Side To Sit Outcome: Adequate  for Discharge Goal: Pt Will Go Sit To Supine/Side Outcome: Adequate for Discharge Goal: Patient Will Transfer Sit To/From Stand Outcome: Adequate for Discharge Goal: Pt Will Transfer Bed To Chair/Chair To Bed Outcome: Adequate for Discharge Goal: Pt Will Ambulate Outcome: Adequate for Discharge

## 2022-09-27 NOTE — Progress Notes (Signed)
Removed pts IV. Coordinated discharge education with virtual RN. After DC instructions we will dress and wheel pt off unit

## 2022-09-27 NOTE — TOC Progression Note (Signed)
Transition of Care [facility]) - Progression Note    Patient Details  Name: Samuel Sloan MRN: [id number] Date of Birth: 1933-11-25  Transition of Care [facility]) CM/SW [place], [place] Phone Number: 09/27/2022, 2:34 PM  Clinical Narrative:   CSW spoke with daughter, Samuel Sloan, to discuss resources for patient to return to [place]. Recommendation is for home health, patient is already receiving therapy at [facility]. Patient will need to be established with outpatient psychiatric follow up. Daughter requesting psychiatry through [facility], information placed on AVS. Daughter also asking for outpatient therapy via telehealth, and CSW explained to her on contacting the patient's insurance to see who's in network. Daughter was hopeful that CSW had additional resources, but CSW explained that she didn't know who was in network with the patient's insurance or providing telehealth therapy. CSW sent a message to psychiatrist to ask for more specific resources available. CSW contacted [place] to ask about patient, patient is established with [facility] for OT and SLP. CSW to send in resumption orders at discharge. Patient recommended for a walker, daughter requesting that he have one delivered to the one. Walker order sent to [employer], they will deliver. CSW to follow.    Expected Discharge Plan: [place] Barriers to Discharge: Continued Medical Work up  Expected Discharge Plan and Services     Post Acute Care Choice: [place] arrangements for the past 2 months: [place] Expected Discharge Date: 09/27/22                                     Social Determinants of Health (SDOH) Interventions SDOH Screenings   Food Insecurity: No Food Insecurity (09/26/2022)  Housing: Low Risk  (09/26/2022)  Transportation Needs: Unmet Transportation Needs (09/26/2022)  Utilities: Not At Risk (09/26/2022)  Tobacco Use: Low Risk  (09/26/2022)     Readmission Risk Interventions     No data to display

## 2022-09-27 NOTE — TOC Initial Note (Signed)
Transition of Care [facility]) - Initial/Assessment Note    Patient Details  Name: Samuel Sloan MRN: [id number] Date of Birth: 1934/08/18  Transition of Care [facility]) CM/SW Contact:    Geralynn Ochs, LCSW Phone Number: 09/27/2022, 2:25 PM  Clinical Narrative:           CSW spoke with patient's daughter, [name], to obtain information about patient's living situation and concern that [place] may not accept him back. Per daughter, the concern with [place] is that they don't understand what is happening with the patient when he has these episodes and they want to have a plan to address them and have supports in place for him to get assistance. CSW discussed with daughter that the patient was still waiting on several people to see him (OT, psych), and then a plan could be developed on safely getting the patient back to [place] and putting supports in place to assist him and the family. CSW to follow.        Expected Discharge Plan: [place] Barriers to Discharge: Continued Medical Work up   Patient Goals and CMS Choice Patient states their goals for this hospitalization and ongoing recovery are:: to figure out what's going on with these episodes CMS Medicare.gov Compare Post Acute Care list provided to:: Patient Choice offered to / list presented to : Patient, Adult [place] ownership interest in [facility].provided to:: Patient    Expected Discharge Plan and Services     Post Acute Care Choice: [place] arrangements for the past 2 months: [place] Expected Discharge Date: 09/27/22                                    Prior Living Arrangements/Services Living arrangements for the past 2 months: [place] Lives with:: Facility Resident Patient language and need for interpreter reviewed:: No Do you feel safe going back to the place where you live?: Yes      Need for  Family Participation in Patient Care: Yes (Comment) Care giver support system in place?: Yes (comment)   Criminal Activity/Legal Involvement Pertinent to Current Situation/Hospitalization: No - Comment as needed  Activities of Daily Living Home Assistive Devices/Equipment: Wheelchair, Eyeglasses, Hearing aid ADL Screening (condition at time of admission) Patient's cognitive ability adequate to safely complete daily activities?: Yes Is the patient deaf or have difficulty hearing?: Yes Does the patient have difficulty seeing, even when wearing glasses/contacts?: No Does the patient have difficulty concentrating, remembering, or making decisions?: No Patient able to express need for assistance with ADLs?: Yes Does the patient have difficulty dressing or bathing?: Yes Independently performs ADLs?: Yes (appropriate for developmental age) Does the patient have difficulty walking or climbing stairs?: Yes Weakness of Legs: Left Weakness of Arms/Hands: None  Permission Sought/Granted Permission sought to share information with : Facility Sport and exercise psychologist, Family Supports Permission granted to share information with : Yes, Verbal Permission Granted  Share Information with NAME: [name]  Permission granted to share info w AGENCY: [employer] granted to share info w Relationship: Daughter     Emotional Assessment   Attitude/Demeanor/Rapport: Engaged Affect (typically observed): Appropriate Orientation: : Oriented to Place, Oriented to Self, Oriented to  Time, Oriented to Situation Alcohol / Substance Use: Not Applicable Psych Involvement: Yes (comment)  Admission diagnosis:  Seizure-like activity ([place]) [R56.9] Patient Active Problem List   Diagnosis  Date Noted   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   Seizure-like activity ([place]) 09/23/2022   History of pulmonary embolism 09/23/2022   History of subdural hematoma 09/23/2022   Depression with anxiety 09/23/2022   Chronic kidney  disease, stage 3a ([place]) 09/23/2022   Generalized anxiety disorder    Obstructive sleep apnea    Major depressive disorder    Diverticulitis    Bronchitis    GERD (gastroesophageal reflux disease)    Hypertension    RLS (restless legs syndrome) 09/11/2016   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   BiPAP (biphasic positive airway pressure) dependence 06/24/2016   Daytime somnolence 06/24/2016   Fatigue 06/24/2016   Traumatic brain injury 05/29/2016   Frequent falls 11/14/2015   Hereditary and idiopathic peripheral neuropathy 11/14/2015   Concussion 07/14/2015   Bilateral dry eyes 05/21/2013   PCP:  Percell Belt, DO Pharmacy:   [facility], [address] AT [address] [place] [address] Phone: [phone] Fax: [phone]     Social Determinants of Health (SDOH) Social History: SDOH Screenings   Food Insecurity: No Food Insecurity (09/26/2022)  Housing: Low Risk  (09/26/2022)  Transportation Needs: Unmet Transportation Needs (09/26/2022)  Utilities: Not At Risk (09/26/2022)  Tobacco Use: Low Risk  (09/26/2022)   SDOH Interventions:     Readmission Risk Interventions     No data to display

## 2022-09-27 NOTE — Plan of Care (Signed)

## 2022-09-27 NOTE — Progress Notes (Signed)
Physical Therapy Treatment Patient Details Name: Samuel Sloan MRN: [id number] DOB: 11-08-1933 Today's Date: 09/27/2022   History of Present Illness 89 y.o. male [facility] with concern for seizure-like activity. Being followed by psych. PMH includes R SDH s/p evacuation with resultant cognitive impairment, recurrent PE, HTN, CKD III, RLS, BPH, OSA.    PT Comments    Pt greeted supine in bed and agreeable to session with good progress towards acute goals. Pt grossly supervision for all mobility, however pt continues to demonstrate impaired balance/postural reactions and general weakness and continues to be limited by LLE pain intermittently. Pt demonstrating increased gait tolerance with RW and able to complete stair training with min guard for safety. Pt without LOB throughouit, however pt with some moments of increased gait speed needing cues to slow for safety. Pt continues to benefit from skilled PT services to progress toward functional mobility goals.     Recommendations for follow up therapy are one component of a multi-disciplinary discharge planning process, led by the attending physician.  Recommendations may be updated based on patient status, additional functional criteria and insurance authorization.  Follow Up Recommendations  No PT follow up     Assistance Recommended at Discharge PRN  Patient can return home with the following Assistance with cooking/housework;Direct supervision/assist for medications management;Direct supervision/assist for financial management;Help with stairs or ramp for entrance   Equipment Recommendations  Rolling walker (2 wheels)    Recommendations for Other Services       Precautions / Restrictions Precautions Precautions: Fall Restrictions Weight Bearing Restrictions: No     Mobility  Bed Mobility Overal bed mobility: Modified Independent                  Transfers Overall transfer level: Modified  independent Equipment used: None, Rolling walker (2 wheels) Transfers: Sit to/from Stand Sit to Stand: Min guard, Supervision           General transfer comment: min guard fown to supervision for safety    Ambulation/Gait Ambulation/Gait assistance: Modified independent (Device/Increase time), Min guard Gait Distance (Feet): 400 Feet Assistive device: Rolling walker (2 wheels) Gait Pattern/deviations: Step-through pattern Gait velocity: WFL     General Gait Details: steady step-through gait with RW, gait in room from EOB<>sink withuot UE support to wash hands   Stairs Stairs: Yes Stairs assistance: Min guard Stair Management: Two rails, Alternating pattern, Forwards Number of Stairs: 6 General stair comments: up and over steps in therapy gym x2   Wheelchair Mobility    Modified Rankin (Stroke Patients Only)       Balance Overall balance assessment: Needs assistance Sitting-balance support: No upper extremity supported, Feet supported Sitting balance-Leahy Scale: Good     Standing balance support: No upper extremity supported Standing balance-Leahy Scale: Fair Standing balance comment: able to ambulate to sink without UE and wash hands                            Cognition Arousal/Alertness: Awake/alert Behavior During Therapy: WFL for tasks assessed/performed Overall Cognitive Status: History of cognitive impairments - at baseline                                 General Comments: pt reports very poor short term memory, poor attention span at baseline; easily distracted; tangential, very HOH use of writing pad helpful for conversation  Exercises      General Comments General comments (skin integrity, edema, etc.): VSS on RA, no temor or jerking movements noted during session      Pertinent Vitals/Pain Pain Assessment Pain Assessment: Faces Faces Pain Scale: Hurts a little bit Pain Location: L knee Pain Descriptors /  Indicators: Sore Pain Intervention(s): Monitored during session, Limited activity within patient's tolerance    Home Living                          Prior Function            PT Goals (current goals can now be found in the care plan section) Acute Rehab PT Goals PT Goal Formulation: With patient Time For Goal Achievement: 10/09/22 Progress towards PT goals: Progressing toward goals    Frequency    Min 3X/week      PT Plan      Co-evaluation              AM-PAC PT "6 Clicks" Mobility   Outcome Measure  Help needed turning from your back to your side while in a flat bed without using bedrails?: None Help needed moving from lying on your back to sitting on the side of a flat bed without using bedrails?: None Help needed moving to and from a bed to a chair (including a wheelchair)?: A Little Help needed standing up from a chair using your arms (e.g., wheelchair or bedside chair)?: A Little Help needed to walk in hospital room?: A Little Help needed climbing 3-5 steps with a railing? : A Little 6 Click Score: 20    End of Session Equipment Utilized During Treatment: Gait belt Activity Tolerance: Patient tolerated treatment well Patient left: in bed;with call bell/phone within reach;with nursing/sitter in room Nurse Communication: Mobility status PT Visit Diagnosis: Other abnormalities of gait and mobility (R26.89);Pain;Muscle weakness (generalized) (M62.81) Pain - Right/Left: Left Pain - part of body: Knee     Time: 1205-1228 PT Time Calculation (min) (ACUTE ONLY): 23 min  Charges:  $Gait Training: 8-22 mins $Therapeutic Activity: 8-22 mins                     [name] R. PTA Acute Rehabilitation Services Office: [place] 09/27/2022, 12:35 PM
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
## 2022-09-27 NOTE — Consult Note (Signed)
Brief Psychiatry Consult Note  Date of service: September 27, 2022 Patient Name: Samuel Sloan DOB: 31-Oct-1933 MRN: [id number] Reason for consult: "daughter requested call" Requesting Provider: Aline August, MD  The patient was last seen by the psychiatry service on 09/26/22. Interim documentation by primary team and nursing staff has been reviewed. At this time, although patient is experiencing itching and daughter requesting to discontinue Lamictal, and there is no evidence of acute psychiatric disturbance requiring ongoing psychiatric consultation. Please see last consult note for full assessment. Final medication recommendations are as follows:  - Discontinue lamictal 25 mg and give Benadryl 25 mg x 1 for itching - Start Depakote ER 250 mg qHS for mood stabilization, impulsivity, and anxiety control. - Advise patient to follow up in approximately 1 week to monitor VPA level - Spoke with LCSW to discuss outpatient follow-up options per daughter's request.  We will sign off at this time. This has been communicated to the primary team. If issues arise in the future, don't hesitate to reconsult the Psychiatry Inpatient Consult Service.   Signed: Rosezetta Schlatter, MD Psychiatry Resident, PGY-2 [place] 09/27/2022, 11:40 AM

## 2022-10-01 DIAGNOSIS — F8082 Social pragmatic communication disorder: Secondary | ICD-10-CM | POA: Diagnosis not present

## 2022-10-01 DIAGNOSIS — R41841 Cognitive communication deficit: Secondary | ICD-10-CM | POA: Diagnosis not present

## 2022-10-01 DIAGNOSIS — R2689 Other abnormalities of gait and mobility: Secondary | ICD-10-CM | POA: Diagnosis not present

## 2022-10-01 DIAGNOSIS — Z9181 History of falling: Secondary | ICD-10-CM | POA: Diagnosis not present

## 2022-10-01 DIAGNOSIS — R488 Other symbolic dysfunctions: Secondary | ICD-10-CM | POA: Diagnosis not present

## 2022-10-01 DIAGNOSIS — M6281 Muscle weakness (generalized): Secondary | ICD-10-CM | POA: Diagnosis not present

## 2022-10-03 ENCOUNTER — Ambulatory Visit (INDEPENDENT_AMBULATORY_CARE_PROVIDER_SITE_OTHER): Payer: Medicare HMO | Admitting: Internal Medicine

## 2022-10-03 ENCOUNTER — Encounter: Payer: Self-pay | Admitting: Internal Medicine

## 2022-10-03 VITALS — BP 120/54 | HR 80 | Ht 71.0 in | Wt 174.4 lb

## 2022-10-03 DIAGNOSIS — Z9181 History of falling: Secondary | ICD-10-CM | POA: Diagnosis not present

## 2022-10-03 DIAGNOSIS — R2689 Other abnormalities of gait and mobility: Secondary | ICD-10-CM | POA: Diagnosis not present

## 2022-10-03 DIAGNOSIS — R55 Syncope and collapse: Secondary | ICD-10-CM | POA: Diagnosis not present

## 2022-10-03 DIAGNOSIS — F8082 Social pragmatic communication disorder: Secondary | ICD-10-CM | POA: Diagnosis not present

## 2022-10-03 DIAGNOSIS — M6281 Muscle weakness (generalized): Secondary | ICD-10-CM | POA: Diagnosis not present

## 2022-10-03 DIAGNOSIS — R41841 Cognitive communication deficit: Secondary | ICD-10-CM | POA: Diagnosis not present

## 2022-10-03 DIAGNOSIS — R488 Other symbolic dysfunctions: Secondary | ICD-10-CM | POA: Diagnosis not present

## 2022-10-03 NOTE — Patient Instructions (Signed)
Medication Instructions:  Your physician recommends that you continue on your current medications as directed. Please refer to the Current Medication list given to you today.  *If you need a refill on your cardiac medications before your next appointment, please call your pharmacy*   Lab Work: NONE If you have labs (blood work) drawn today and your tests are completely normal, you will receive your results only by: [place] (if you have MyChart) OR A paper copy in the mail If you have any lab test that is abnormal or we need to change your treatment, we will call you to review the results.   Testing/Procedures: NONE   Follow-Up: At [facility], you and your health needs are our priority.  As part of our continuing mission to provide you with exceptional heart care, we have created designated Provider Care Teams.  These Care Teams include your primary Cardiologist (physician) and Advanced Practice Providers (APPs -  Physician Assistants and Nurse Practitioners) who all work together to provide you with the care you need, when you need it.  We recommend signing up for the patient portal called "MyChart".  Sign up information is provided on this After Visit Summary.  MyChart is used to connect with patients for Virtual Visits (Telemedicine).  Patients are able to view lab/test results, encounter notes, upcoming appointments, etc.  Non-urgent messages can be sent to your provider as well.   To learn more about what you can do with MyChart, go to [email or web address].    Your next appointment:   6 month(s)  Provider:   Janina Mayo, MD
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
## 2022-10-04 DIAGNOSIS — Z45321 Encounter for adjustment and management of cochlear device: Secondary | ICD-10-CM | POA: Diagnosis not present

## 2022-10-04 DIAGNOSIS — H903 Sensorineural hearing loss, bilateral: Secondary | ICD-10-CM | POA: Diagnosis not present

## 2022-10-07 DIAGNOSIS — R41841 Cognitive communication deficit: Secondary | ICD-10-CM | POA: Diagnosis not present

## 2022-10-07 DIAGNOSIS — F8082 Social pragmatic communication disorder: Secondary | ICD-10-CM | POA: Diagnosis not present

## 2022-10-07 DIAGNOSIS — R488 Other symbolic dysfunctions: Secondary | ICD-10-CM | POA: Diagnosis not present

## 2022-10-08 DIAGNOSIS — R4586 Emotional lability: Secondary | ICD-10-CM | POA: Diagnosis not present

## 2022-10-08 DIAGNOSIS — R41841 Cognitive communication deficit: Secondary | ICD-10-CM | POA: Diagnosis not present

## 2022-10-08 DIAGNOSIS — R488 Other symbolic dysfunctions: Secondary | ICD-10-CM | POA: Diagnosis not present

## 2022-10-08 DIAGNOSIS — M6281 Muscle weakness (generalized): Secondary | ICD-10-CM | POA: Diagnosis not present

## 2022-10-08 DIAGNOSIS — R4189 Other symptoms and signs involving cognitive functions and awareness: Secondary | ICD-10-CM | POA: Diagnosis not present

## 2022-10-08 DIAGNOSIS — R569 Unspecified convulsions: Secondary | ICD-10-CM | POA: Diagnosis not present

## 2022-10-08 DIAGNOSIS — Z9181 History of falling: Secondary | ICD-10-CM | POA: Diagnosis not present

## 2022-10-08 DIAGNOSIS — G4700 Insomnia, unspecified: Secondary | ICD-10-CM | POA: Diagnosis not present

## 2022-10-08 DIAGNOSIS — R2689 Other abnormalities of gait and mobility: Secondary | ICD-10-CM | POA: Diagnosis not present

## 2022-10-08 DIAGNOSIS — I10 Essential (primary) hypertension: Secondary | ICD-10-CM | POA: Diagnosis not present

## 2022-10-09 DIAGNOSIS — R2681 Unsteadiness on feet: Secondary | ICD-10-CM | POA: Diagnosis not present

## 2022-10-09 DIAGNOSIS — R41841 Cognitive communication deficit: Secondary | ICD-10-CM | POA: Diagnosis not present

## 2022-10-09 DIAGNOSIS — F8082 Social pragmatic communication disorder: Secondary | ICD-10-CM | POA: Diagnosis not present

## 2022-10-09 DIAGNOSIS — R488 Other symbolic dysfunctions: Secondary | ICD-10-CM | POA: Diagnosis not present

## 2022-10-09 DIAGNOSIS — R296 Repeated falls: Secondary | ICD-10-CM | POA: Diagnosis not present

## 2022-10-10 DIAGNOSIS — M6281 Muscle weakness (generalized): Secondary | ICD-10-CM | POA: Diagnosis not present

## 2022-10-10 DIAGNOSIS — R41841 Cognitive communication deficit: Secondary | ICD-10-CM | POA: Diagnosis not present

## 2022-10-10 DIAGNOSIS — R488 Other symbolic dysfunctions: Secondary | ICD-10-CM | POA: Diagnosis not present

## 2022-10-10 DIAGNOSIS — Z9181 History of falling: Secondary | ICD-10-CM | POA: Diagnosis not present

## 2022-10-10 DIAGNOSIS — R2689 Other abnormalities of gait and mobility: Secondary | ICD-10-CM | POA: Diagnosis not present

## 2022-10-11 DIAGNOSIS — K219 Gastro-esophageal reflux disease without esophagitis: Secondary | ICD-10-CM | POA: Diagnosis not present

## 2022-10-11 DIAGNOSIS — R2681 Unsteadiness on feet: Secondary | ICD-10-CM | POA: Diagnosis not present

## 2022-10-11 DIAGNOSIS — K8681 Exocrine pancreatic insufficiency: Secondary | ICD-10-CM | POA: Diagnosis not present

## 2022-10-11 DIAGNOSIS — R296 Repeated falls: Secondary | ICD-10-CM | POA: Diagnosis not present

## 2022-10-14 DIAGNOSIS — R488 Other symbolic dysfunctions: Secondary | ICD-10-CM | POA: Diagnosis not present

## 2022-10-14 DIAGNOSIS — R2681 Unsteadiness on feet: Secondary | ICD-10-CM | POA: Diagnosis not present

## 2022-10-14 DIAGNOSIS — R296 Repeated falls: Secondary | ICD-10-CM | POA: Diagnosis not present

## 2022-10-14 DIAGNOSIS — R41841 Cognitive communication deficit: Secondary | ICD-10-CM | POA: Diagnosis not present

## 2022-10-14 DIAGNOSIS — F8082 Social pragmatic communication disorder: Secondary | ICD-10-CM | POA: Diagnosis not present

## 2022-10-15 DIAGNOSIS — R4189 Other symptoms and signs involving cognitive functions and awareness: Secondary | ICD-10-CM | POA: Diagnosis not present

## 2022-10-15 DIAGNOSIS — R41841 Cognitive communication deficit: Secondary | ICD-10-CM | POA: Diagnosis not present

## 2022-10-15 DIAGNOSIS — R488 Other symbolic dysfunctions: Secondary | ICD-10-CM | POA: Diagnosis not present

## 2022-10-15 DIAGNOSIS — G4733 Obstructive sleep apnea (adult) (pediatric): Secondary | ICD-10-CM | POA: Diagnosis not present

## 2022-10-15 DIAGNOSIS — Z9181 History of falling: Secondary | ICD-10-CM | POA: Diagnosis not present

## 2022-10-15 DIAGNOSIS — R2689 Other abnormalities of gait and mobility: Secondary | ICD-10-CM | POA: Diagnosis not present

## 2022-10-15 DIAGNOSIS — M6281 Muscle weakness (generalized): Secondary | ICD-10-CM | POA: Diagnosis not present

## 2022-10-15 DIAGNOSIS — Z87820 Personal history of traumatic brain injury: Secondary | ICD-10-CM | POA: Diagnosis not present

## 2022-10-15 DIAGNOSIS — R4681 Obsessive-compulsive behavior: Secondary | ICD-10-CM | POA: Diagnosis not present

## 2022-10-15 DIAGNOSIS — R4689 Other symptoms and signs involving appearance and behavior: Secondary | ICD-10-CM | POA: Diagnosis not present

## 2022-10-15 DIAGNOSIS — Z8679 Personal history of other diseases of the circulatory system: Secondary | ICD-10-CM | POA: Diagnosis not present

## 2022-10-16 DIAGNOSIS — R41841 Cognitive communication deficit: Secondary | ICD-10-CM | POA: Diagnosis not present

## 2022-10-16 DIAGNOSIS — R296 Repeated falls: Secondary | ICD-10-CM | POA: Diagnosis not present

## 2022-10-16 DIAGNOSIS — F8082 Social pragmatic communication disorder: Secondary | ICD-10-CM | POA: Diagnosis not present

## 2022-10-16 DIAGNOSIS — R2681 Unsteadiness on feet: Secondary | ICD-10-CM | POA: Diagnosis not present

## 2022-10-16 DIAGNOSIS — R488 Other symbolic dysfunctions: Secondary | ICD-10-CM | POA: Diagnosis not present

## 2022-10-17 DIAGNOSIS — R488 Other symbolic dysfunctions: Secondary | ICD-10-CM | POA: Diagnosis not present

## 2022-10-17 DIAGNOSIS — R2689 Other abnormalities of gait and mobility: Secondary | ICD-10-CM | POA: Diagnosis not present

## 2022-10-17 DIAGNOSIS — R41841 Cognitive communication deficit: Secondary | ICD-10-CM | POA: Diagnosis not present

## 2022-10-17 DIAGNOSIS — Z9181 History of falling: Secondary | ICD-10-CM | POA: Diagnosis not present

## 2022-10-17 DIAGNOSIS — M6281 Muscle weakness (generalized): Secondary | ICD-10-CM | POA: Diagnosis not present

## 2022-10-21 DIAGNOSIS — Z9181 History of falling: Secondary | ICD-10-CM | POA: Diagnosis not present

## 2022-10-21 DIAGNOSIS — F8082 Social pragmatic communication disorder: Secondary | ICD-10-CM | POA: Diagnosis not present

## 2022-10-21 DIAGNOSIS — R488 Other symbolic dysfunctions: Secondary | ICD-10-CM | POA: Diagnosis not present

## 2022-10-21 DIAGNOSIS — R41841 Cognitive communication deficit: Secondary | ICD-10-CM | POA: Diagnosis not present

## 2022-10-21 DIAGNOSIS — M6281 Muscle weakness (generalized): Secondary | ICD-10-CM | POA: Diagnosis not present

## 2022-10-21 DIAGNOSIS — R2689 Other abnormalities of gait and mobility: Secondary | ICD-10-CM | POA: Diagnosis not present

## 2022-10-22 DIAGNOSIS — R2681 Unsteadiness on feet: Secondary | ICD-10-CM | POA: Diagnosis not present

## 2022-10-22 DIAGNOSIS — R296 Repeated falls: Secondary | ICD-10-CM | POA: Diagnosis not present

## 2022-10-23 DIAGNOSIS — F445 Conversion disorder with seizures or convulsions: Secondary | ICD-10-CM | POA: Diagnosis not present

## 2022-10-23 DIAGNOSIS — R41844 Frontal lobe and executive function deficit: Secondary | ICD-10-CM | POA: Diagnosis not present

## 2022-10-23 DIAGNOSIS — F4381 Prolonged grief disorder: Secondary | ICD-10-CM | POA: Diagnosis not present

## 2022-10-23 DIAGNOSIS — R488 Other symbolic dysfunctions: Secondary | ICD-10-CM | POA: Diagnosis not present

## 2022-10-23 DIAGNOSIS — F8082 Social pragmatic communication disorder: Secondary | ICD-10-CM | POA: Diagnosis not present

## 2022-10-23 DIAGNOSIS — R41841 Cognitive communication deficit: Secondary | ICD-10-CM | POA: Diagnosis not present

## 2022-10-24 DIAGNOSIS — R296 Repeated falls: Secondary | ICD-10-CM | POA: Diagnosis not present

## 2022-10-24 DIAGNOSIS — R2681 Unsteadiness on feet: Secondary | ICD-10-CM | POA: Diagnosis not present

## 2022-10-28 DIAGNOSIS — R488 Other symbolic dysfunctions: Secondary | ICD-10-CM | POA: Diagnosis not present

## 2022-10-28 DIAGNOSIS — R41841 Cognitive communication deficit: Secondary | ICD-10-CM | POA: Diagnosis not present

## 2022-10-28 DIAGNOSIS — F8082 Social pragmatic communication disorder: Secondary | ICD-10-CM | POA: Diagnosis not present

## 2022-10-29 DIAGNOSIS — Z9181 History of falling: Secondary | ICD-10-CM | POA: Diagnosis not present

## 2022-10-29 DIAGNOSIS — R488 Other symbolic dysfunctions: Secondary | ICD-10-CM | POA: Diagnosis not present

## 2022-10-29 DIAGNOSIS — M6281 Muscle weakness (generalized): Secondary | ICD-10-CM | POA: Diagnosis not present

## 2022-10-29 DIAGNOSIS — R41841 Cognitive communication deficit: Secondary | ICD-10-CM | POA: Diagnosis not present

## 2022-10-29 DIAGNOSIS — R2689 Other abnormalities of gait and mobility: Secondary | ICD-10-CM | POA: Diagnosis not present

## 2022-10-30 DIAGNOSIS — R296 Repeated falls: Secondary | ICD-10-CM | POA: Diagnosis not present

## 2022-10-30 DIAGNOSIS — R2681 Unsteadiness on feet: Secondary | ICD-10-CM | POA: Diagnosis not present

## 2022-11-01 DIAGNOSIS — R2689 Other abnormalities of gait and mobility: Secondary | ICD-10-CM | POA: Diagnosis not present

## 2022-11-01 DIAGNOSIS — Z9181 History of falling: Secondary | ICD-10-CM | POA: Diagnosis not present

## 2022-11-01 DIAGNOSIS — M6281 Muscle weakness (generalized): Secondary | ICD-10-CM | POA: Diagnosis not present

## 2022-11-01 DIAGNOSIS — F8082 Social pragmatic communication disorder: Secondary | ICD-10-CM | POA: Diagnosis not present

## 2022-11-01 DIAGNOSIS — R488 Other symbolic dysfunctions: Secondary | ICD-10-CM | POA: Diagnosis not present

## 2022-11-01 DIAGNOSIS — R41841 Cognitive communication deficit: Secondary | ICD-10-CM | POA: Diagnosis not present

## 2022-11-04 DIAGNOSIS — M6281 Muscle weakness (generalized): Secondary | ICD-10-CM | POA: Diagnosis not present

## 2022-11-04 DIAGNOSIS — F8082 Social pragmatic communication disorder: Secondary | ICD-10-CM | POA: Diagnosis not present

## 2022-11-04 DIAGNOSIS — R488 Other symbolic dysfunctions: Secondary | ICD-10-CM | POA: Diagnosis not present

## 2022-11-04 DIAGNOSIS — Z9181 History of falling: Secondary | ICD-10-CM | POA: Diagnosis not present

## 2022-11-04 DIAGNOSIS — R2689 Other abnormalities of gait and mobility: Secondary | ICD-10-CM | POA: Diagnosis not present

## 2022-11-04 DIAGNOSIS — R41841 Cognitive communication deficit: Secondary | ICD-10-CM | POA: Diagnosis not present

## 2022-11-06 DIAGNOSIS — Z9181 History of falling: Secondary | ICD-10-CM | POA: Diagnosis not present

## 2022-11-06 DIAGNOSIS — F445 Conversion disorder with seizures or convulsions: Secondary | ICD-10-CM | POA: Diagnosis not present

## 2022-11-06 DIAGNOSIS — R41841 Cognitive communication deficit: Secondary | ICD-10-CM | POA: Diagnosis not present

## 2022-11-06 DIAGNOSIS — R2689 Other abnormalities of gait and mobility: Secondary | ICD-10-CM | POA: Diagnosis not present

## 2022-11-06 DIAGNOSIS — F4381 Prolonged grief disorder: Secondary | ICD-10-CM | POA: Diagnosis not present

## 2022-11-06 DIAGNOSIS — R488 Other symbolic dysfunctions: Secondary | ICD-10-CM | POA: Diagnosis not present

## 2022-11-06 DIAGNOSIS — R41844 Frontal lobe and executive function deficit: Secondary | ICD-10-CM | POA: Diagnosis not present

## 2022-11-06 DIAGNOSIS — R296 Repeated falls: Secondary | ICD-10-CM | POA: Diagnosis not present

## 2022-11-06 DIAGNOSIS — F8082 Social pragmatic communication disorder: Secondary | ICD-10-CM | POA: Diagnosis not present

## 2022-11-06 DIAGNOSIS — R2681 Unsteadiness on feet: Secondary | ICD-10-CM | POA: Diagnosis not present

## 2022-11-06 DIAGNOSIS — M6281 Muscle weakness (generalized): Secondary | ICD-10-CM | POA: Diagnosis not present

## 2022-11-12 DIAGNOSIS — R41841 Cognitive communication deficit: Secondary | ICD-10-CM | POA: Diagnosis not present

## 2022-11-12 DIAGNOSIS — Z9181 History of falling: Secondary | ICD-10-CM | POA: Diagnosis not present

## 2022-11-12 DIAGNOSIS — R488 Other symbolic dysfunctions: Secondary | ICD-10-CM | POA: Diagnosis not present

## 2022-11-12 DIAGNOSIS — H35033 Hypertensive retinopathy, bilateral: Secondary | ICD-10-CM | POA: Diagnosis not present

## 2022-11-12 DIAGNOSIS — M6281 Muscle weakness (generalized): Secondary | ICD-10-CM | POA: Diagnosis not present

## 2022-11-12 DIAGNOSIS — H35371 Puckering of macula, right eye: Secondary | ICD-10-CM | POA: Diagnosis not present

## 2022-11-12 DIAGNOSIS — H26493 Other secondary cataract, bilateral: Secondary | ICD-10-CM | POA: Diagnosis not present

## 2022-11-12 DIAGNOSIS — Z961 Presence of intraocular lens: Secondary | ICD-10-CM | POA: Diagnosis not present

## 2022-11-12 DIAGNOSIS — R2689 Other abnormalities of gait and mobility: Secondary | ICD-10-CM | POA: Diagnosis not present

## 2022-11-14 DIAGNOSIS — R41841 Cognitive communication deficit: Secondary | ICD-10-CM | POA: Diagnosis not present

## 2022-11-14 DIAGNOSIS — Z9181 History of falling: Secondary | ICD-10-CM | POA: Diagnosis not present

## 2022-11-14 DIAGNOSIS — R2689 Other abnormalities of gait and mobility: Secondary | ICD-10-CM | POA: Diagnosis not present

## 2022-11-14 DIAGNOSIS — M6281 Muscle weakness (generalized): Secondary | ICD-10-CM | POA: Diagnosis not present

## 2022-11-14 DIAGNOSIS — R488 Other symbolic dysfunctions: Secondary | ICD-10-CM | POA: Diagnosis not present

## 2022-11-18 DIAGNOSIS — R488 Other symbolic dysfunctions: Secondary | ICD-10-CM | POA: Diagnosis not present

## 2022-11-18 DIAGNOSIS — R41841 Cognitive communication deficit: Secondary | ICD-10-CM | POA: Diagnosis not present

## 2022-11-18 DIAGNOSIS — R2689 Other abnormalities of gait and mobility: Secondary | ICD-10-CM | POA: Diagnosis not present

## 2022-11-18 DIAGNOSIS — M6281 Muscle weakness (generalized): Secondary | ICD-10-CM | POA: Diagnosis not present

## 2022-11-18 DIAGNOSIS — Z9181 History of falling: Secondary | ICD-10-CM | POA: Diagnosis not present

## 2022-11-20 DIAGNOSIS — H903 Sensorineural hearing loss, bilateral: Secondary | ICD-10-CM | POA: Diagnosis not present

## 2022-11-21 DIAGNOSIS — F445 Conversion disorder with seizures or convulsions: Secondary | ICD-10-CM | POA: Diagnosis not present

## 2022-11-21 DIAGNOSIS — F4381 Prolonged grief disorder: Secondary | ICD-10-CM | POA: Diagnosis not present

## 2022-11-21 DIAGNOSIS — R41844 Frontal lobe and executive function deficit: Secondary | ICD-10-CM | POA: Diagnosis not present

## 2022-11-22 DIAGNOSIS — R2689 Other abnormalities of gait and mobility: Secondary | ICD-10-CM | POA: Diagnosis not present

## 2022-11-22 DIAGNOSIS — Z9181 History of falling: Secondary | ICD-10-CM | POA: Diagnosis not present

## 2022-11-22 DIAGNOSIS — M6281 Muscle weakness (generalized): Secondary | ICD-10-CM | POA: Diagnosis not present

## 2022-11-22 DIAGNOSIS — R41841 Cognitive communication deficit: Secondary | ICD-10-CM | POA: Diagnosis not present

## 2022-11-22 DIAGNOSIS — R488 Other symbolic dysfunctions: Secondary | ICD-10-CM | POA: Diagnosis not present

## 2022-11-25 DIAGNOSIS — Z9181 History of falling: Secondary | ICD-10-CM | POA: Diagnosis not present

## 2022-11-25 DIAGNOSIS — R2689 Other abnormalities of gait and mobility: Secondary | ICD-10-CM | POA: Diagnosis not present

## 2022-11-25 DIAGNOSIS — R488 Other symbolic dysfunctions: Secondary | ICD-10-CM | POA: Diagnosis not present

## 2022-11-25 DIAGNOSIS — M6281 Muscle weakness (generalized): Secondary | ICD-10-CM | POA: Diagnosis not present

## 2022-11-25 DIAGNOSIS — R41841 Cognitive communication deficit: Secondary | ICD-10-CM | POA: Diagnosis not present

## 2022-11-26 DIAGNOSIS — G4733 Obstructive sleep apnea (adult) (pediatric): Secondary | ICD-10-CM | POA: Diagnosis not present

## 2022-11-26 DIAGNOSIS — R41844 Frontal lobe and executive function deficit: Secondary | ICD-10-CM | POA: Diagnosis not present

## 2022-11-27 DIAGNOSIS — R488 Other symbolic dysfunctions: Secondary | ICD-10-CM | POA: Diagnosis not present

## 2022-11-27 DIAGNOSIS — R2689 Other abnormalities of gait and mobility: Secondary | ICD-10-CM | POA: Diagnosis not present

## 2022-11-27 DIAGNOSIS — M6281 Muscle weakness (generalized): Secondary | ICD-10-CM | POA: Diagnosis not present

## 2022-11-27 DIAGNOSIS — R41841 Cognitive communication deficit: Secondary | ICD-10-CM | POA: Diagnosis not present

## 2022-11-27 DIAGNOSIS — Z9181 History of falling: Secondary | ICD-10-CM | POA: Diagnosis not present

## 2022-12-02 DIAGNOSIS — H52223 Regular astigmatism, bilateral: Secondary | ICD-10-CM | POA: Diagnosis not present

## 2022-12-02 DIAGNOSIS — H5203 Hypermetropia, bilateral: Secondary | ICD-10-CM | POA: Diagnosis not present

## 2022-12-02 DIAGNOSIS — H524 Presbyopia: Secondary | ICD-10-CM | POA: Diagnosis not present

## 2022-12-03 DIAGNOSIS — R41841 Cognitive communication deficit: Secondary | ICD-10-CM | POA: Diagnosis not present

## 2022-12-03 DIAGNOSIS — Z9181 History of falling: Secondary | ICD-10-CM | POA: Diagnosis not present

## 2022-12-03 DIAGNOSIS — M6281 Muscle weakness (generalized): Secondary | ICD-10-CM | POA: Diagnosis not present

## 2022-12-03 DIAGNOSIS — R488 Other symbolic dysfunctions: Secondary | ICD-10-CM | POA: Diagnosis not present

## 2022-12-03 DIAGNOSIS — R2689 Other abnormalities of gait and mobility: Secondary | ICD-10-CM | POA: Diagnosis not present

## 2022-12-05 DIAGNOSIS — R488 Other symbolic dysfunctions: Secondary | ICD-10-CM | POA: Diagnosis not present

## 2022-12-05 DIAGNOSIS — R2689 Other abnormalities of gait and mobility: Secondary | ICD-10-CM | POA: Diagnosis not present

## 2022-12-05 DIAGNOSIS — R41841 Cognitive communication deficit: Secondary | ICD-10-CM | POA: Diagnosis not present

## 2022-12-05 DIAGNOSIS — Z9181 History of falling: Secondary | ICD-10-CM | POA: Diagnosis not present

## 2022-12-05 DIAGNOSIS — M6281 Muscle weakness (generalized): Secondary | ICD-10-CM | POA: Diagnosis not present

## 2022-12-05 DIAGNOSIS — F330 Major depressive disorder, recurrent, mild: Secondary | ICD-10-CM | POA: Diagnosis not present

## 2022-12-10 DIAGNOSIS — Z9181 History of falling: Secondary | ICD-10-CM | POA: Diagnosis not present

## 2022-12-10 DIAGNOSIS — R41841 Cognitive communication deficit: Secondary | ICD-10-CM | POA: Diagnosis not present

## 2022-12-10 DIAGNOSIS — R488 Other symbolic dysfunctions: Secondary | ICD-10-CM | POA: Diagnosis not present

## 2022-12-10 DIAGNOSIS — R2689 Other abnormalities of gait and mobility: Secondary | ICD-10-CM | POA: Diagnosis not present

## 2022-12-10 DIAGNOSIS — M6281 Muscle weakness (generalized): Secondary | ICD-10-CM | POA: Diagnosis not present

## 2022-12-13 DIAGNOSIS — Z9181 History of falling: Secondary | ICD-10-CM | POA: Diagnosis not present

## 2022-12-13 DIAGNOSIS — R41841 Cognitive communication deficit: Secondary | ICD-10-CM | POA: Diagnosis not present

## 2022-12-13 DIAGNOSIS — R2689 Other abnormalities of gait and mobility: Secondary | ICD-10-CM | POA: Diagnosis not present

## 2022-12-13 DIAGNOSIS — Z87828 Personal history of other (healed) physical injury and trauma: Secondary | ICD-10-CM | POA: Diagnosis not present

## 2022-12-13 DIAGNOSIS — Z79899 Other long term (current) drug therapy: Secondary | ICD-10-CM | POA: Diagnosis not present

## 2022-12-13 DIAGNOSIS — R488 Other symbolic dysfunctions: Secondary | ICD-10-CM | POA: Diagnosis not present

## 2022-12-13 DIAGNOSIS — G4733 Obstructive sleep apnea (adult) (pediatric): Secondary | ICD-10-CM | POA: Diagnosis not present

## 2022-12-13 DIAGNOSIS — M6281 Muscle weakness (generalized): Secondary | ICD-10-CM | POA: Diagnosis not present

## 2022-12-13 DIAGNOSIS — R41844 Frontal lobe and executive function deficit: Secondary | ICD-10-CM | POA: Diagnosis not present

## 2022-12-19 DIAGNOSIS — R41844 Frontal lobe and executive function deficit: Secondary | ICD-10-CM | POA: Diagnosis not present

## 2022-12-19 DIAGNOSIS — F445 Conversion disorder with seizures or convulsions: Secondary | ICD-10-CM | POA: Diagnosis not present

## 2022-12-19 DIAGNOSIS — F4381 Prolonged grief disorder: Secondary | ICD-10-CM | POA: Diagnosis not present

## 2023-01-07 DIAGNOSIS — G4733 Obstructive sleep apnea (adult) (pediatric): Secondary | ICD-10-CM | POA: Diagnosis not present

## 2023-01-07 DIAGNOSIS — G4731 Primary central sleep apnea: Secondary | ICD-10-CM | POA: Diagnosis not present

## 2023-01-08 DIAGNOSIS — H903 Sensorineural hearing loss, bilateral: Secondary | ICD-10-CM | POA: Diagnosis not present

## 2023-01-09 DIAGNOSIS — F330 Major depressive disorder, recurrent, mild: Secondary | ICD-10-CM | POA: Diagnosis not present

## 2023-01-14 DIAGNOSIS — R296 Repeated falls: Secondary | ICD-10-CM | POA: Diagnosis not present

## 2023-01-14 DIAGNOSIS — R278 Other lack of coordination: Secondary | ICD-10-CM | POA: Diagnosis not present

## 2023-01-14 DIAGNOSIS — R488 Other symbolic dysfunctions: Secondary | ICD-10-CM | POA: Diagnosis not present

## 2023-01-15 DIAGNOSIS — R41844 Frontal lobe and executive function deficit: Secondary | ICD-10-CM | POA: Diagnosis not present

## 2023-01-15 DIAGNOSIS — F445 Conversion disorder with seizures or convulsions: Secondary | ICD-10-CM | POA: Diagnosis not present

## 2023-01-15 DIAGNOSIS — F4381 Prolonged grief disorder: Secondary | ICD-10-CM | POA: Diagnosis not present

## 2023-01-16 DIAGNOSIS — F330 Major depressive disorder, recurrent, mild: Secondary | ICD-10-CM | POA: Diagnosis not present

## 2023-01-20 DIAGNOSIS — H524 Presbyopia: Secondary | ICD-10-CM | POA: Diagnosis not present

## 2023-01-21 DIAGNOSIS — R278 Other lack of coordination: Secondary | ICD-10-CM | POA: Diagnosis not present

## 2023-01-21 DIAGNOSIS — R296 Repeated falls: Secondary | ICD-10-CM | POA: Diagnosis not present

## 2023-01-21 DIAGNOSIS — R488 Other symbolic dysfunctions: Secondary | ICD-10-CM | POA: Diagnosis not present

## 2023-01-22 DIAGNOSIS — R2681 Unsteadiness on feet: Secondary | ICD-10-CM | POA: Diagnosis not present

## 2023-01-22 DIAGNOSIS — R296 Repeated falls: Secondary | ICD-10-CM | POA: Diagnosis not present

## 2023-01-22 DIAGNOSIS — R2689 Other abnormalities of gait and mobility: Secondary | ICD-10-CM | POA: Diagnosis not present

## 2023-01-24 ENCOUNTER — Ambulatory Visit (HOSPITAL_COMMUNITY)
Admission: EM | Admit: 2023-01-24 | Discharge: 2023-01-25 | Disposition: A | Discharge status: Other Acute Inpatient Hospital | Payer: Medicare HMO | Attending: Urology | Admitting: Urology

## 2023-01-24 DIAGNOSIS — Z1152 Encounter for screening for COVID-19: Secondary | ICD-10-CM | POA: Diagnosis not present

## 2023-01-24 DIAGNOSIS — F332 Major depressive disorder, recurrent severe without psychotic features: Secondary | ICD-10-CM | POA: Diagnosis not present

## 2023-01-24 LAB — POCT URINE DRUG SCREEN - MANUAL ENTRY (I-SCREEN)
POC Amphetamine UR: NOT DETECTED
POC Buprenorphine (BUP): NOT DETECTED
POC Cocaine UR: NOT DETECTED
POC Marijuana UR: NOT DETECTED
POC Methadone UR: NOT DETECTED
POC Methamphetamine UR: NOT DETECTED
POC Morphine: NOT DETECTED
POC Oxazepam (BZO): NOT DETECTED
POC Oxycodone UR: NOT DETECTED
POC Secobarbital (BAR): NOT DETECTED

## 2023-01-24 LAB — CBC WITH DIFFERENTIAL/PLATELET
Abs Immature Granulocytes: 0.02 10*3/uL (ref 0.00–0.07)
Basophils Absolute: 0.1 10*3/uL (ref 0.0–0.1)
Basophils Relative: 1 %
Eosinophils Absolute: 0.2 10*3/uL (ref 0.0–0.5)
Eosinophils Relative: 4 %
HCT: 42.5 % (ref 39.0–52.0)
Hemoglobin: 14 g/dL (ref 13.0–17.0)
Immature Granulocytes: 0 %
Lymphocytes Relative: 30 %
Lymphs Abs: 1.6 10*3/uL (ref 0.7–4.0)
MCH: 29.9 pg (ref 26.0–34.0)
MCHC: 32.9 g/dL (ref 30.0–36.0)
MCV: 90.6 fL (ref 80.0–100.0)
Monocytes Absolute: 0.5 10*3/uL (ref 0.1–1.0)
Monocytes Relative: 10 %
Neutro Abs: 2.9 10*3/uL (ref 1.7–7.7)
Neutrophils Relative %: 55 %
Platelets: 176 10*3/uL (ref 150–400)
RBC: 4.69 MIL/uL (ref 4.22–5.81)
RDW: 13.6 % (ref 11.5–15.5)
WBC: 5.3 10*3/uL (ref 4.0–10.5)
nRBC: 0 % (ref 0.0–0.2)

## 2023-01-24 LAB — COMPREHENSIVE METABOLIC PANEL
ALT: 25 U/L (ref 0–44)
AST: 23 U/L (ref 15–41)
Albumin: 3.8 g/dL (ref 3.5–5.0)
Alkaline Phosphatase: 57 U/L (ref 38–126)
Anion gap: 8 (ref 5–15)
BUN: 22 mg/dL (ref 8–23)
CO2: 27 mmol/L (ref 22–32)
Calcium: 9.3 mg/dL (ref 8.9–10.3)
Chloride: 101 mmol/L (ref 98–111)
Creatinine, Ser: 1.32 mg/dL — ABNORMAL HIGH (ref 0.61–1.24)
GFR, Estimated: 52 mL/min — ABNORMAL LOW (ref >60)
Glucose, Bld: 97 mg/dL (ref 70–99)
Potassium: 4.1 mmol/L (ref 3.5–5.1)
Sodium: 136 mmol/L (ref 135–145)
Total Bilirubin: 0.5 mg/dL (ref 0.3–1.2)
Total Protein: 6.1 g/dL — ABNORMAL LOW (ref 6.5–8.1)

## 2023-01-24 LAB — HEMOGLOBIN A1C
Hgb A1c MFr Bld: 5.5 % (ref 4.8–5.6)
Mean Plasma Glucose: 111.15 mg/dL

## 2023-01-24 LAB — LIPID PANEL
Cholesterol: 162 mg/dL (ref 0–200)
HDL: 46 mg/dL (ref >40)
LDL Cholesterol: 88 mg/dL (ref 0–99)
Total CHOL/HDL Ratio: 3.5 RATIO
Triglycerides: 139 mg/dL (ref <150)
VLDL: 28 mg/dL (ref 0–40)

## 2023-01-24 LAB — TSH: TSH: 4.01 u[IU]/mL (ref 0.350–4.500)

## 2023-01-24 LAB — ETHANOL: Alcohol, Ethyl (B): <10 mg/dL (ref <10)

## 2023-01-24 MED ORDER — ALUM & MAG HYDROXIDE-SIMETH 200-200-20 MG/5ML PO SUSP: 30.0000 mL | ORAL | Status: DC | PRN

## 2023-01-24 MED ORDER — ACETAMINOPHEN 325 MG PO TABS
650.0000 mg | ORAL_TABLET | Freq: Four times a day (QID) | ORAL | Status: DC | PRN
  Administered 2023-01-25: 650 mg via ORAL
  Filled 2023-01-24: qty 2

## 2023-01-24 MED ORDER — MAGNESIUM HYDROXIDE 400 MG/5ML PO SUSP: 30.0000 mL | Freq: Every day | ORAL | Status: DC | PRN

## 2023-01-24 NOTE — ED Notes (Signed)
Patient is asking about his "night-time" meds - no orders at this time for scheduled meds or sleep meds - provider notified

## 2023-01-24 NOTE — ED Notes (Signed)
Patient is hard of hearing - has a hearing aid at this time. Patient is also unsteady on his feet. Encouraged to always use his walker when standing. No sxs of distress at this time - will continue to monitor for safety

## 2023-01-24 NOTE — ED Provider Notes (Signed)
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 purposes only. Performed at [facility], [address] [place], [place] [address]    D-Dimer, Quant 07/31/2022 0.59 (H)  0.00 - 0.50 ug/mL-FEU Final   Comment: (NOTE) At the manufacturer cut-off value of 0.5 g/mL FEU, this assay has a negative predictive value of 95-100%.This assay is intended for use in conjunction with a  clinical pretest probability (PTP) assessment model to exclude pulmonary embolism (PE) and deep venous thrombosis (DVT) in outpatients suspected of PE or DVT. Results should be correlated with clinical presentation. Performed at [facility], [address], [place], [place] [address]    Glucose-Capillary 07/31/2022 86  70 - 99 mg/dL Final   Glucose reference range applies only to samples taken after fasting for at least 8 hours.    Allergies: Verapamil, Oxycodone, Amlodipine, Ibuprofen, and Toprol xl  [metoprolol]  Medications:  Facility Ordered Medications  Medication   acetaminophen (TYLENOL) tablet 650 mg   alum & mag hydroxide-simeth (MAALOX/MYLANTA) 200-200-20 MG/5ML suspension 30 mL   magnesium hydroxide (MILK OF MAGNESIA) suspension 30 mL   aspirin EC tablet 81 mg   losartan (COZAAR) tablet 100 mg   pantoprazole (PROTONIX) EC tablet 40 mg   lipase/protease/amylase (CREON) capsule 36,000 Units   melatonin tablet 5 mg   pramipexole (MIRAPEX) tablet 1 mg   sertraline (ZOLOFT) tablet 100 mg   clonazePAM (KLONOPIN) tablet 0.5 mg   diclofenac Sodium (VOLTAREN) 1 % topical gel 2 g   PTA Medications  Medication Sig   omeprazole (PRILOSEC) 20 MG capsule Take 20 mg by mouth daily before breakfast.   sertraline (ZOLOFT) 50 MG tablet Take 2 tablets every night (Patient taking differently: Take 50 mg by mouth at bedtime.)   aspirin EC 81 MG tablet Take 81 mg by mouth in the morning.   loperamide (IMODIUM A-D) 2 MG tablet Take 2 mg by mouth 4 (four) times daily as needed for diarrhea or loose stools.   NON FORMULARY Take 1 tablet by mouth See admin instructions. PureOne multivitamin- Take 1 tablet by mouth in the morning   losartan (COZAAR) 100 MG tablet Take 100 mg by mouth daily.   CHLORASEPTIC MOUTH PAIN 1.4 % LIQD Use as directed 1 spray in the mouth or throat as needed for throat irritation / pain (related to post-nasal drip).   bismuth  subsalicylate (PEPTO BISMOL) 262  MG/15ML suspension Take 30 mLs by mouth every 6 (six) hours as needed for indigestion.   meclizine (ANTIVERT) 25 MG tablet Take 25 mg by mouth 3 (three) times daily as needed for dizziness or nausea.   Wheat Dextrin (BENEFIBER) CHEW Chew 1-2 tablets by mouth daily as needed (for constipation).   traZODone (DESYREL) 50 MG tablet Take 50 mg by mouth at bedtime.   pramipexole (MIRAPEX) 1 MG tablet Take 1 mg by mouth at bedtime.   melatonin 1 MG TABS tablet Take 1 mg by mouth at bedtime.   lipase/protease/amylase (CREON) 36000 UNITS CPEP capsule Take 36,000 Units by mouth See admin instructions. Take 36,000 units by mouth two to three times a day with meals   diclofenac Sodium (VOLTAREN) 1 % GEL Apply 4 g topically 4 (four) times daily as needed (knee pain).   loratadine (CLARITIN) 10 MG tablet Take 1 tablet (10 mg total) by mouth daily as needed for allergies.   clonazePAM (KLONOPIN) 0.5 MG tablet Take 0.5 mg by mouth 3 (three) times daily.      Medical Decision Making  Patient is recommended for inpatient gero-psych admission.  Patient will be admitted to [facility] for continuous assessment while awaiting inpatient psychiatric bed availability.    Recommendations  Based on my evaluation the patient does not appear to have an emergency medical condition.  Maricela Bo, NP 01/25/23  6:07 AM

## 2023-01-24 NOTE — BH Assessment (Signed)
Comprehensive Clinical Assessment (CCA) Note    01/24/2023 Samuel Sloan [id number]  Disposition: Cecilio Asper, NP recommends inpatient hospitalization.    The patient demonstrates the following risk factors for suicide: Chronic risk factors for suicide include: psychiatric disorder of Major Depressive Disorder . Acute risk factors for suicide include: family or marital conflict. Protective factors for this patient include: positive social support. Considering these factors, the overall suicide risk at this point appears to be high. Patient is not appropriate for outpatient follow up.   Chief Complaint: Depression  Visit Diagnosis:  Major Depressive D/O   Patient is a 89 year old male that presents this date as a voluntary walk in to [facility] with his daughter Samuel Sloan [phone] who assists with collateral information. Patient reports that he wrote his "last letter to family members," (to note patient presented a copy to this Clinical research associate) last week when he realized that he was having financial and other psycho-social issues that prompted him to have thoughts of self harm. Patient denies any S/I, H/I or AVH at the time of triage although reports he did have intent to "cut his throat" last week at the time he was writing the letter. Patient has been diagnosed with depression and receives services from Damon MD (Psychiatrist) who assists with medication management. See medication list attached to patient's letter to family. Patient reports he takes his medications as indicated. Patient denies any previous attempts/gestures at self harm and denies any SA issues or prior inpatient admissions associated with mental health. Patient resides alone in a independent living facility ([facility] since November 2023) and also meets with a counselor every other week through [facility]. 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 Patient reports multiple stressors  associated with S/I to include divorce from his wife in 2017, feeling family has, "disowned him" and as afore mentioned financial issues that prompted him to have these feelings last week. All information including medication list, note from daughter (collateral) and patient's letter to family is with patient.   Pt was alert and cooperative. Pt is dressed?unremarkably, alert, oriented x4 with normal speech and?normal?motor behavior. Eye contact is good. Pt's mood is depressed, and affect is anxious. Thought process is coherent and relevant. Pt's insight is?fair?and judgement is poor. There is no indication pt is currently responding to internal stimuli or experiencing delusional thought content. Pt was cooperative and tearful throughout assessment.    CCA Screening, Triage and Referral (STR)  Patient Reported Information How did you hear about [place]? Self  What Is the Reason for Your Visit/Call Today? Patient is a 89 year old male that presents this date as a voluntary walk in to [facility] with his daughter Samuel Sloan [phone] who assists with collateral information. Patient reports that he wrote his "last letter to family members," (to note patient presented a copy to this Clinical research associate) last week when he realized that he was having financial and other psycho-social issues that prompted him to have thoughts of self harm. Patient denies any S/I, H/I or AVH at the time of triage although reports he did have intent to "cut his throat" last week at the time he was writing the letter. Patient has been diagnosed with depression and receives services from [place] MD (Psychiatrist) who assists with medication management. See medication list attached to patient's letter to family. Patient reports he takes his medications as indicated. Patient denies any previous attempts/gestures at self harm and denies any SA  issues or prior inpatient admissions associated with mental health. Patient resides alone in a independent  living facility ([facility] since November 2023) and also meets with a counselor every other week through [facility]. 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 Patient reports multiple stressors associated with S/I to include divorce from his wife in 2017, feeling family has, "disowned him" and as afore mentioned financial issues that prompted him to have these feelings last week. All information including medication list, note from daughter (collateral) and patient's letter to family is with patient.  How Long Has This Been Causing You Problems? 1 wk - 1 month  What Do You Feel Would Help You the Most Today? Treatment for Depression or other mood problem   Have You Recently Had Any Thoughts About Hurting Yourself? Yes  Are You Planning to Commit Suicide/Harm Yourself At This time? No   Flowsheet Row ED from 01/24/2023 in [facility] ED to Hosp-Admission (Discharged) from 09/23/2022 in [facility] ED from 07/31/2022 in [facility] Emergency Department at [facility]  C-SSRS RISK CATEGORY High Risk No Risk No Risk       Have you Recently Had Thoughts About Hurting Someone Karolee Ohs? No  Are You Planning to Harm Someone at This Time? No  Explanation: Pt denies HI   Have You Used Any Alcohol or Drugs in the Past 24 Hours? No  What Did You Use and How Much? n/a   Do You Currently Have a Therapist/Psychiatrist? Yes  Name of Therapist/Psychiatrist: Name of Therapist/Psychiatrist: Psychiatrist:Dr. Randa Evens   Have You Been Recently Discharged From Any Office Practice or Programs? No  Explanation of Discharge From Practice/Program: n/a     CCA Screening Triage Referral Assessment Type of Contact: Face-to-Face  Telemedicine Service Delivery:   Is this Initial or Reassessment?   Date Telepsych consult ordered in [facility]:    Time Telepsych consult ordered in [facility]:    Location of Assessment:  [facility] Assessment Services  Provider Location: [facility]   Collateral Involvement: Daughter : Bruna Potter   Does Patient Have a Automotive engineer Guardian? No  Legal Guardian Contact Information: n/a  Copy of Legal Guardianship Form: -- (n/a)  Legal Guardian Notified of Arrival: -- (n/a)  Legal Guardian Notified of Pending Discharge: -- (n/a)  If Minor and Not Living with Parent(s), Who has Custody? n/a  Is CPS involved or ever been involved? Never  Is APS involved or ever been involved? Never   Patient Determined To Be At Risk for Harm To Self or Others Based on Review of Patient Reported Information or Presenting Complaint? Yes, for Self-Harm  Method: Plan without intent  Availability of Means: No access or NA  Intent: Clearly intends on inflicting harm that could cause death  Notification Required: No need or identified person  Additional Information for Danger to Others Potential: -- (n/a)  Additional Comments for Danger to Others Potential: n/a  Are There Guns or Other Weapons in Your Home? No  Types of Guns/Weapons: Pt denies access to weapons/ guns  Are These Weapons Safely Secured?                            Yes  Who Could Verify You Are Able To Have These Secured: n/a  Do You Have any Outstanding Charges, Pending Court Dates, Parole/Probation? Pt denies any  pending legal charges  Contacted To Inform of Risk of Harm To Self or Others: -- (n/a)    Does Patient Present under Involuntary Commitment? No    [place] of Residence: [place]   Patient Currently Receiving the Following Services: Medication Management   Determination of Need: Urgent (48 hours)   Options For Referral: Inpatient Hospitalization (To be determined)     CCA Biopsychosocial Patient Reported Schizophrenia/Schizoaffective Diagnosis in Past: No   Strengths: Self Awareness   Mental Health Symptoms Depression:   Hopelessness; Tearfulness;  Worthlessness   Duration of Depressive symptoms:  Duration of Depressive Symptoms: Greater than two weeks   Mania:   None   Anxiety:    Worrying   Psychosis:   None   Duration of Psychotic symptoms:    Trauma:   None   Obsessions:   None   Compulsions:   None   Inattention:   None   Hyperactivity/Impulsivity:   None   Oppositional/Defiant Behaviors:   None   Emotional Irregularity:   None   Other Mood/Personality Symptoms:   n/a    Mental Status Exam Appearance and self-care  Stature:   Average   Weight:   Average weight   Clothing:   Neat/clean   Grooming:   Normal   Cosmetic use:   None   Posture/gait:   Normal   Motor activity:   Not Remarkable   Sensorium  Attention:   Normal   Concentration:   Normal   Orientation:   Time; Situation; Place; Person   Recall/memory:   Normal   Affect and Mood  Affect:   Anxious; Depressed; Flat; Tearful   Mood:   Depressed; Hopeless; Worthless   Relating  Eye contact:   Normal   Facial expression:   Depressed   Attitude toward examiner:   Cooperative   Thought and Language  Speech flow:  Clear and Coherent   Thought content:   Appropriate to Mood and Circumstances   Preoccupation:   None   Hallucinations:   None   Organization:   Coherent   [employer] of Knowledge:   Fair   Intelligence:   Average   Abstraction:   Normal   Judgement:   Fair   Dance movement psychotherapist:   Adequate   Insight:   Fair   Decision Making:   Normal   Social Functioning  Social Maturity:   Isolates   Social Judgement:   Normal   Stress  Stressors:   Grief/losses; Family conflict; Transitions; Financial   Coping Ability:   Overwhelmed; Exhausted   Skill Deficits:   Communication; Interpersonal   Supports:   Family     Religion: Religion/Spirituality Are You A Religious Person?: Yes What is Your Religious Affiliation?: Unknown How Might This  Affect Treatment?: n/a  Leisure/Recreation: Leisure / Recreation Do You Have Hobbies?: No  Exercise/Diet: Exercise/Diet Do You Exercise?: No Have You Gained or Lost A Significant Amount of Weight in the Past Six Months?: No Do You Follow a Special Diet?: No Do You Have Any Trouble Sleeping?: No   CCA Employment/Education Employment/Work Situation: Employment / Work Academic librarian Situation: Retired Passenger transport manager has Been Impacted by Current Illness: No Has Patient ever Been in Equities trader?:  Industrial/product designer)  Education: Education Is Patient Currently Attending School?: No Last Grade Completed: 12 Did You Product manager?: No Did You Have An Individualized Education Program (IIEP): No Did You Have Any Difficulty At School?: No Patient's Education Has Been Impacted by Current Illness:  No   CCA Family/Childhood History Family and Relationship History: Family history Marital status: Single Does patient have children?: Yes How many children?: 2 How is patient's relationship with their children?: Pt's daughter is responsible of caring for pt's finances and care. Pt's daughter reports pt's son passed away 2 years ago.  Childhood History:  Childhood History By whom was/is the patient raised?: Both parents Did patient suffer any verbal/emotional/physical/sexual abuse as a child?: No Did patient suffer from severe childhood neglect?: No Has patient ever been sexually abused/assaulted/raped as an adolescent or adult?: No Was the patient ever a victim of a crime or a disaster?: No Witnessed domestic violence?: No Has patient been affected by domestic violence as an adult?: No       CCA Substance Use Alcohol/Drug Use: Alcohol / Drug Use Pain Medications: See MAR Prescriptions: See MAR Over the Counter: See MAR History of alcohol / drug use?: No history of alcohol / drug abuse Longest period of sobriety (when/how long): n/a Negative Consequences of Use:  (n/a) Withdrawal  Symptoms: None                         ASAM's:  Six Dimensions of Multidimensional Assessment  Dimension 1:  Acute Intoxication and/or Withdrawal Potential:      Dimension 2:  Biomedical Conditions and Complications:      Dimension 3:  Emotional, Behavioral, or Cognitive Conditions and Complications:     Dimension 4:  Readiness to Change:     Dimension 5:  Relapse, Continued use, or Continued Problem Potential:     Dimension 6:  Recovery/Living Environment:     ASAM Severity Score:    ASAM Recommended Level of Treatment: ASAM Recommended Level of Treatment:  (n/a)   Substance use Disorder (SUD) Substance Use Disorder (SUD)  Checklist Symptoms of Substance Use:  (n/a)  Recommendations for Services/Supports/Treatments: Recommendations for Services/Supports/Treatments Recommendations For Services/Supports/Treatments:  (n/a)  Discharge Disposition:    DSM5 Diagnoses: Patient Active Problem List   Diagnosis Date Noted   Hypomania (HCC) 09/25/2022   Seizure-like activity (HCC) 09/23/2022   History of pulmonary embolism 09/23/2022   History of subdural hematoma 09/23/2022   Depression with anxiety 09/23/2022   Chronic kidney disease, stage 3a (HCC) 09/23/2022   Generalized anxiety disorder    Obstructive sleep apnea    Major depressive disorder    Diverticulitis    Bronchitis    GERD (gastroesophageal reflux disease)    Hypertension    RLS (restless legs syndrome) 09/11/2016   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   BiPAP (biphasic positive airway pressure) dependence 06/24/2016   Daytime somnolence 06/24/2016   Fatigue 06/24/2016   Traumatic brain injury 05/29/2016   Frequent falls 11/14/2015   Hereditary and idiopathic peripheral neuropathy 11/14/2015   Concussion 07/14/2015   Bilateral dry eyes 05/21/2013     Referrals to Alternative Service(s): Referred to Alternative Service(s):   Place:   Date:   Time:    Referred to Alternative Service(s):    Place:   Date:   Time:    Referred to Alternative Service(s):   Place:   Date:   Time:    Referred to Alternative Service(s):   Place:   Date:   Time:     Dava Najjar, MA,LCMHCA,NCC

## 2023-01-24 NOTE — Progress Notes (Signed)
   01/24/23 1859  BHUC Triage Screening (Walk-ins at [facility] only)  How Did You Hear About [place]? Self  What Is the Reason for Your Visit/Call Today? Patient is a 89 year old male that presents this date as a voluntary walk in to [facility] with his daughter Davone Shinault [phone] who assists with collateral information. Patient reports that he wrote his "last letter to family members," (to note patient presented a copy to this Clinical research associate) last week when he realized that he was having financial and other psycho-social issues that prompted him to have thoughts of self harm. Patient denies any S/I, H/I or AVH at the time of triage although reports he did have intent to "cut his throat" last week at the time he was writing the letter. Patient has been diagnosed with depression and receives services from [place] MD (Psychiatrist) who assists with medication management. See medication list attached to patient's letter to family. Patient reports he takes his medications as indicated. Patient denies any previous attempts/gestures at self harm and denies any SA issues or prior inpatient admissions associated with mental health. Patient resides alone in a independent living facility ([facility] since November 2023) and also meets with a counselor every other week through [facility]. 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 Patient reports multiple stressors associated with S/I to include divorce from his wife in 2017, feeling family has, "disowned him" and as afore mentioned financial issues that prompted him to have these feelings last week. All information including medication list, note from daughter (collateral) and patient's letter to family is with patient.  How Long Has This Been Causing You Problems? 1 wk - 1 month  Have You Recently Had Any Thoughts About Hurting Yourself? Yes  How long ago did you have thoughts about hurting yourself? last week (within the last 3  days)  Are You Planning to Commit Suicide/Harm Yourself At This time? No  Have you Recently Had Thoughts About Hurting Someone Karolee Ohs? No  Are You Planning To Harm Someone At This Time? No  Are you currently experiencing any auditory, visual or other hallucinations? No  Have You Used Any Alcohol or Drugs in the Past 24 Hours? No  Do you have any current medical co-morbidities that require immediate attention? No  Clinician description of patient physical appearance/behavior: Patient presents with a pleasant affect  What Do You Feel Would Help You the Most Today? Treatment for Depression or other mood problem  If access to [facility] was not available, would you have sought care in the Emergency Department? No  Determination of Need Urgent (48 hours)  Options For Referral  (To be determined)

## 2023-01-24 NOTE — BH Assessment (Signed)
Patient is a 88 year old male that presents this date as a voluntary walk in to [facility] with his daughter Sevin Langenbach [phone] who assists with collateral information. Patient reports that he wrote his "last letter to family members," (to note patient presented a copy to this Clinical research associate) last week when he realized that he was having financial and other psycho-social issues that prompted him to have thoughts of self harm. Patient denies any S/I, H/I or AVH at the time of triage although reports he did have intent to "cut his throat" last week at the time he was writing the letter. Patient has been diagnosed with depression and receives services from [place] MD (Psychiatrist) who assists with medication management. See medication list attached to patient's letter to family. Patient reports he takes his medications as indicated. Patient denies any previous attempts/gestures at self harm and denies any SA issues or prior inpatient admissions associated with mental health. Patient resides alone in a independent living facility ([facility] since November 2023) and also meets with a counselor every other week through [facility]. 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 Patient reports multiple stressors associated with S/I to include divorce from his wife in 2017, feeling family has, "disowned him" and as afore mentioned financial issues that prompted him to have these feelings last week. All information including medication list, note from daughter (collateral) and patient's letter to family is with patient.

## 2023-01-25 ENCOUNTER — Emergency Department (HOSPITAL_COMMUNITY)
Admission: EM | Admit: 2023-01-25 | Discharge: 2023-01-27 | Disposition: A | Discharge status: Home / Self Care | Payer: Medicare HMO | Attending: Emergency Medicine | Admitting: Emergency Medicine

## 2023-01-25 ENCOUNTER — Encounter (HOSPITAL_COMMUNITY): Payer: Self-pay

## 2023-01-25 DIAGNOSIS — Z7982 Long term (current) use of aspirin: Secondary | ICD-10-CM | POA: Diagnosis not present

## 2023-01-25 DIAGNOSIS — R45851 Suicidal ideations: Secondary | ICD-10-CM

## 2023-01-25 DIAGNOSIS — F32A Depression, unspecified: Secondary | ICD-10-CM | POA: Diagnosis not present

## 2023-01-25 DIAGNOSIS — F332 Major depressive disorder, recurrent severe without psychotic features: Secondary | ICD-10-CM | POA: Diagnosis not present

## 2023-01-25 DIAGNOSIS — Z0000 Encounter for general adult medical examination without abnormal findings: Secondary | ICD-10-CM | POA: Insufficient documentation

## 2023-01-25 LAB — POC SARS CORONAVIRUS 2 AG: SARSCOV2ONAVIRUS 2 AG: NEGATIVE

## 2023-01-25 MED ORDER — TRAZODONE HCL 100 MG PO TABS: 100.0000 mg | ORAL_TABLET | Freq: Every day | ORAL | Status: DC

## 2023-01-25 MED ORDER — LORAZEPAM 1 MG PO TABS
1.0000 mg | ORAL_TABLET | Freq: Four times a day (QID) | ORAL | Status: DC | PRN
  Administered 2023-01-26: 1 mg via ORAL
  Filled 2023-01-25: qty 1

## 2023-01-25 MED ORDER — SERTRALINE HCL 100 MG PO TABS
100.0000 mg | ORAL_TABLET | Freq: Every day | ORAL | Status: DC
  Administered 2023-01-25: 100 mg via ORAL
  Filled 2023-01-25: qty 1

## 2023-01-25 MED ORDER — POLYVINYL ALCOHOL 1.4 % OP SOLN: 2.0000 [drp] | Freq: Four times a day (QID) | OPHTHALMIC | Status: DC | PRN

## 2023-01-25 MED ORDER — PANTOPRAZOLE SODIUM 40 MG PO TBEC
40.0000 mg | DELAYED_RELEASE_TABLET | Freq: Every day | ORAL | Status: DC
  Administered 2023-01-26: 40 mg via ORAL
  Filled 2023-01-25 (×2): qty 1

## 2023-01-25 MED ORDER — MELATONIN 5 MG PO TABS
5.0000 mg | ORAL_TABLET | Freq: Every day | ORAL | Status: DC
  Administered 2023-01-25: 5 mg via ORAL
  Filled 2023-01-25: qty 1

## 2023-01-25 MED ORDER — ACETAMINOPHEN 325 MG PO TABS
650.0000 mg | ORAL_TABLET | Freq: Two times a day (BID) | ORAL | Status: DC
  Administered 2023-01-25 – 2023-01-26 (×3): 650 mg via ORAL
  Filled 2023-01-25 (×3): qty 2

## 2023-01-25 MED ORDER — PRAMIPEXOLE DIHYDROCHLORIDE 1 MG PO TABS
1.0000 mg | ORAL_TABLET | Freq: Every day | ORAL | Status: DC
  Administered 2023-01-25 – 2023-01-26 (×2): 1 mg via ORAL
  Filled 2023-01-25 (×3): qty 1

## 2023-01-25 MED ORDER — LOSARTAN POTASSIUM 50 MG PO TABS
100.0000 mg | ORAL_TABLET | Freq: Every day | ORAL | Status: DC
  Administered 2023-01-25: 100 mg via ORAL
  Filled 2023-01-25: qty 2

## 2023-01-25 MED ORDER — CLONAZEPAM 0.5 MG PO TABS
0.5000 mg | ORAL_TABLET | Freq: Three times a day (TID) | ORAL | Status: DC
  Administered 2023-01-25 – 2023-01-26 (×4): 0.5 mg via ORAL
  Filled 2023-01-25 (×4): qty 1

## 2023-01-25 MED ORDER — LOSARTAN POTASSIUM 50 MG PO TABS
100.0000 mg | ORAL_TABLET | Freq: Every day | ORAL | Status: DC
  Administered 2023-01-26: 100 mg via ORAL
  Filled 2023-01-25 (×2): qty 2

## 2023-01-25 MED ORDER — BENEFIBER PO CHEW: 1.0000 | CHEWABLE_TABLET | Freq: Every day | ORAL | Status: DC | PRN

## 2023-01-25 MED ORDER — SERTRALINE HCL 100 MG PO TABS: 100.0000 mg | ORAL_TABLET | Freq: Every day | ORAL | Status: DC

## 2023-01-25 MED ORDER — MECLIZINE HCL 12.5 MG PO TABS: 25.0000 mg | ORAL_TABLET | Freq: Three times a day (TID) | ORAL | Status: DC | PRN

## 2023-01-25 MED ORDER — CLONAZEPAM 0.5 MG PO TABS
0.5000 mg | ORAL_TABLET | Freq: Three times a day (TID) | ORAL | Status: DC
  Administered 2023-01-25 (×3): 0.5 mg via ORAL
  Filled 2023-01-25 (×3): qty 1

## 2023-01-25 MED ORDER — PSYLLIUM 95 % PO PACK: 1.0000 | PACK | Freq: Every day | ORAL | Status: DC | PRN

## 2023-01-25 MED ORDER — PANTOPRAZOLE SODIUM 40 MG PO TBEC
40.0000 mg | DELAYED_RELEASE_TABLET | Freq: Every day | ORAL | Status: DC
  Administered 2023-01-25: 40 mg via ORAL
  Filled 2023-01-25: qty 1

## 2023-01-25 MED ORDER — PRAMIPEXOLE DIHYDROCHLORIDE 1 MG PO TABS
1.0000 mg | ORAL_TABLET | Freq: Every day | ORAL | Status: DC
  Administered 2023-01-25: 1 mg via ORAL
  Filled 2023-01-25 (×2): qty 1

## 2023-01-25 MED ORDER — DICLOFENAC SODIUM 1 % EX GEL
2.0000 g | Freq: Once | CUTANEOUS | Status: AC
  Administered 2023-01-25: 2 g via TOPICAL
  Filled 2023-01-25: qty 100

## 2023-01-25 MED ORDER — TRAZODONE HCL 100 MG PO TABS
100.0000 mg | ORAL_TABLET | Freq: Every day | ORAL | Status: DC
  Administered 2023-01-25 – 2023-01-26 (×2): 100 mg via ORAL
  Filled 2023-01-25 (×2): qty 1

## 2023-01-25 MED ORDER — ASPIRIN 81 MG PO TBEC
81.0000 mg | DELAYED_RELEASE_TABLET | Freq: Every morning | ORAL | Status: DC
  Administered 2023-01-26: 81 mg via ORAL
  Filled 2023-01-25: qty 1

## 2023-01-25 MED ORDER — HYPROMELLOSE (GONIOSCOPIC) 2.5 % OP SOLN: 2.0000 [drp] | Freq: Four times a day (QID) | OPHTHALMIC | Status: DC | PRN

## 2023-01-25 MED ORDER — PANCRELIPASE (LIP-PROT-AMYL) 12000-38000 UNITS PO CPEP
36000.0000 [IU] | ORAL_CAPSULE | Freq: Three times a day (TID) | ORAL | Status: DC
  Administered 2023-01-25 (×3): 36000 [IU] via ORAL
  Filled 2023-01-25 (×3): qty 3

## 2023-01-25 MED ORDER — RISPERIDONE 1 MG PO TABS: 1.0000 mg | ORAL_TABLET | Freq: Every day | ORAL | Status: DC

## 2023-01-25 MED ORDER — RISPERIDONE 1 MG PO TABS
1.0000 mg | ORAL_TABLET | Freq: Every day | ORAL | Status: DC
  Administered 2023-01-25 – 2023-01-26 (×2): 1 mg via ORAL
  Filled 2023-01-25: qty 1
  Filled 2023-01-25: qty 2

## 2023-01-25 MED ORDER — CLONAZEPAM 0.5 MG PO TABS: 0.5000 mg | ORAL_TABLET | Freq: Three times a day (TID) | ORAL | Status: DC

## 2023-01-25 MED ORDER — ASPIRIN 81 MG PO TBEC
81.0000 mg | DELAYED_RELEASE_TABLET | Freq: Every day | ORAL | Status: DC
  Administered 2023-01-25: 81 mg via ORAL
  Filled 2023-01-25: qty 1

## 2023-01-25 MED ORDER — PANCRELIPASE (LIP-PROT-AMYL) 36000-114000 UNITS PO CPEP
72000.0000 [IU] | ORAL_CAPSULE | Freq: Three times a day (TID) | ORAL | Status: DC
  Administered 2023-01-26 (×2): 72000 [IU] via ORAL
  Filled 2023-01-25 (×4): qty 2

## 2023-01-25 MED ORDER — ADULT MULTIVITAMIN W/MINERALS CH
1.0000 | ORAL_TABLET | Freq: Every day | ORAL | Status: DC
  Administered 2023-01-25: 1 via ORAL
  Filled 2023-01-25: qty 1

## 2023-01-25 MED ORDER — MELATONIN 5 MG PO TABS
5.0000 mg | ORAL_TABLET | Freq: Every day | ORAL | Status: DC
  Administered 2023-01-25 – 2023-01-26 (×2): 5 mg via ORAL
  Filled 2023-01-25 (×2): qty 1

## 2023-01-25 MED ORDER — SERTRALINE HCL 50 MG PO TABS
100.0000 mg | ORAL_TABLET | Freq: Every day | ORAL | Status: DC
  Administered 2023-01-25 – 2023-01-26 (×2): 100 mg via ORAL
  Filled 2023-01-25 (×2): qty 2

## 2023-01-25 NOTE — Progress Notes (Signed)
Per Cecilio Asper, NP recommends inpatient hospitalization for pt. This CSW has requested [facility] Fransico Michael, RN to review. 1st shift CSW  to follow up.  Maryjean Ka, MSW, LCSWA 01/25/2023 1:28 AM

## 2023-01-25 NOTE — ED Notes (Signed)
Pt is calm and cooperative. Currently sitting reading a book. Pt requested to speak with a provider. States "I spoke with the doctor 4 hrs ago, thought I would've received an update by now." Pt would like to know the plan. Secure chat sent to provider making aware of pt's request. No further needs at this time. Will continue to monitor. Olin Hauser

## 2023-01-25 NOTE — Progress Notes (Signed)
Patient has been denied by [facility] due to no appropriate beds available. Patient meets BH inpatient criteria per Vernard Gambles, NP. Patient has been faxed out to the following facilities:   [facility]  [address], [place] [address] [phone]  [facility] [address]  [address] [place] [address] [phone]  [facility]  [address] [place] [address] [phone]  [facility]  [address] [place] [address] [phone]  [facility]  [address] [place] [address] [phone]  [facility]  [address], [place] [address] [phone]  [facility]  [address] [place] [address] [phone]  [facility]  [address], [place] [address] [phone]  [facility]  [address] [place], [place] [address] [phone]  [facility]  [address], [place] [address] [phone]  [facility]  [address], [place] [address] [phone]  [facility]  [address] [place] [address] [phone]  [facility]  [address] [place] [address] [phone]   Damita Dunnings, MSW, LCSW-A  10:48 AM 01/25/2023

## 2023-01-25 NOTE — ED Notes (Signed)
Paperwork faxed to [facility].  [facility] at bedside to transfer to [facility]. Pt served IVC papers, faxed to [facility].

## 2023-01-25 NOTE — ED Notes (Signed)
Daughter notified of Transfer to [facility].  Pt is IVC.

## 2023-01-25 NOTE — ED Notes (Signed)
Patient is shaking on and off and states that the only thing that helps is his clonazepam. Reviewed his PTA list and do not see it on there but called his daughter who states that he takes 1 mg tid scheduled - provider made aware

## 2023-01-25 NOTE — ED Notes (Signed)
Patient resting with no sxs of distress noted - will continue to monitor for safety 

## 2023-01-25 NOTE — Progress Notes (Signed)
[facility] LCSW Progress Note   01/25/2023    6:25 PM  Samuel Sloan   [id number]   Type of Contact and Topic:  Psychiatric Bed Placement   Pt accepted to [facility]    Patient meets inpatient criteria per Vernard Gambles, NP  The attending provider will be Dr. Renato Gails   Call report to [phone]    Rosebud Poles, RN @ [facility] notified.     Pt scheduled  to arrive at [facility] TODAY.  Damita Dunnings, MSW, LCSW-A  6:27 PM 01/25/2023

## 2023-01-25 NOTE — ED Provider Notes (Signed)
[facility]ERGENC[facility] DEPARTMENT AT [facility] Provider Note   CSN: [id number] Arrival date & time: 01/25/23  2011     History  Chief Complaint  Patient presents with   Medical Clearance   IVC    Colton Palmateer is a 89 y.o. male history of depression here presenting with depression.  He has been having issues with suicidal ideation for the last month or so.  Patient plans to cut his throat.  Patient went to behavioral health urgent care.  He apparently wrote a letter to his family members contemplating suicide.  Patient has been struggling emotionally and financially as well.  Patient walks with a walker at baseline.  While at behavioral health urgent care, he was supposed to be admitted to the psychiatric unit but he wanted to leave.  He was subsequently IVC. He was also transferred here to wait for a bed in the psych unit.  I reviewed the labs from yesterday and they were unremarkable.  The history is provided by the patient.       Home Medications Prior to Admission medications   Medication Sig Start Date End Date Taking? Authorizing Provider  acetaminophen (TYLENOL) 325 MG tablet Take 650 mg by mouth 2 (two) times daily.    [provider]  aspirin EC 81 MG tablet Take 81 mg by mouth in the morning.    [provider]  bismuth subsalicylate (PEPTO BISMOL) 262 MG/15ML suspension Take 30 mLs by mouth every 6 (six) hours as needed for indigestion.    [provider]  CHLORASEPTIC MOUTH PAIN 1.4 % LIQD Use as directed 1 spray in the mouth or throat as needed for throat irritation / pain (related to post-nasal drip).    [provider]  clonazePAM (KLONOPIN) 0.5 MG tablet Take 0.5 mg by mouth 3 (three) times daily.    [provider]  hydroxypropyl methylcellulose / hypromellose (ISOPTO TEARS / GONIOVISC) 2.5 % ophthalmic solution 2 drops 4 (four) times daily as needed for dry eyes.    [provider]   lipase/protease/amylase (CREON) 36000 UNITS CPEP capsule Take 72,000 Units by mouth 3 (three) times daily before meals. Take 36,000 units (1 cap) with snacks as needed    [provider]  loperamide (IMODIUM A-D) 2 MG tablet Take 2 mg by mouth 4 (four) times daily as needed for diarrhea or loose stools.    [provider]  losartan (COZAAR) 100 MG tablet Take 100 mg by mouth daily.    [provider]  meclizine (ANTIVERT) 25 MG tablet Take 25 mg by mouth 3 (three) times daily as needed for dizziness or nausea.    [provider]  melatonin 1 MG TABS tablet Take 5 mg by mouth at bedtime.    [provider]  NON FORMULARY Take 1 tablet by mouth See admin instructions. PureOne multivitamin- Take 1 tablet by mouth in the morning    [provider]  omeprazole (PRILOSEC) 20 MG capsule Take 20 mg by mouth daily before breakfast.    [provider]  pramipexole (MIRAPEX) 1 MG tablet Take 1 mg by mouth at bedtime.    [provider]  risperiDONE (RISPERDAL) 1 MG tablet Take 1 mg by mouth at bedtime.    [provider]  sertraline (ZOLOFT) 100 MG tablet Take 1 tablet (100 mg total) by mouth at bedtime. 01/25/23   Ardis Hughs, NP  traZODone (DESYREL) 50 MG tablet Take 100 mg by mouth at  bedtime.    [provider]  Wheat Dextrin (BENEFIBER) CHEW Chew 1-2 tablets by mouth daily as needed (for constipation).    [provider]      Allergies    Verapamil, Amlodipine, Ibuprofen, Oxycodone, Toprol xl  [metoprolol], and Lamotrigine    Review of Systems   Review of Systems  Psychiatric/Behavioral:  Positive for dysphoric mood and suicidal ideas.   All other systems reviewed and are negative.   Physical Exam Updated Vital Signs BP (!) 156/95   Pulse 67   Temp 98.3 F (36.8 C) (Oral)   Resp 17   SpO2 94%  Physical Exam Vitals and nursing note reviewed.  Constitutional:      Comments: Depressed  and chronically ill  HENT:     Head: Normocephalic.     Nose: Nose normal.     Mouth/Throat:     Mouth: Mucous membranes are moist.  Eyes:     Extraocular Movements: Extraocular movements intact.     Pupils: Pupils are equal, round, and reactive to light.  Cardiovascular:     Rate and Rhythm: Normal rate and regular rhythm.     Pulses: Normal pulses.     Heart sounds: Normal heart sounds.  Pulmonary:     Effort: Pulmonary effort is normal.     Breath sounds: Normal breath sounds.  Abdominal:     General: Abdomen is flat.     Palpations: Abdomen is soft.  Musculoskeletal:        General: Normal range of motion.     Cervical back: Normal range of motion and neck supple.  Skin:    General: Skin is warm.     Capillary Refill: Capillary refill takes less than 2 seconds.  Neurological:     General: No focal deficit present.     Mental Status: He is oriented to person, place, and time.  Psychiatric:     Comments: Depressed      ED Results / Procedures / Treatments   Labs (all labs ordered are listed, but only abnormal results are displayed) Labs Reviewed - No data to display  EKG None  Radiology No results found.  Procedures Procedures    Medications Ordered in ED Medications  acetaminophen (TYLENOL) tablet 650 mg (has no administration in time range)  aspirin EC tablet 81 mg (has no administration in time range)  clonazePAM (KLONOPIN) tablet 0.5 mg (has no administration in time range)  lipase/protease/amylase (CREON) capsule 72,000 Units (has no administration in time range)  losartan (COZAAR) tablet 100 mg (has no administration in time range)  melatonin tablet 5 mg (has no administration in time range)  pantoprazole (PROTONIX) EC tablet 40 mg (has no administration in time range)  pramipexole (MIRAPEX) tablet 1 mg (has no administration in time range)  risperiDONE (RISPERDAL) tablet 1 mg (has no administration in time range)  sertraline (ZOLOFT) tablet 100 mg  (has no administration in time range)  traZODone (DESYREL) tablet 100 mg (has no administration in time range)  Benefiber CHEW 1-2 tablet (has no administration in time range)  polyvinyl alcohol (LIQUIFILM TEARS) 1.4 % ophthalmic solution 2 drop (has no administration in time range)    ED Course/ Medical Decision Making/ A&P                             Medical Decision Making Jermar Colter is a 89 y.o. male here presenting with suicidal ideation and depression.  Patient is  under IVC.  I did review his labs from yesterday and he was already medically cleared.  Patient is awaiting psych placement.   Problems Addressed: Depression, unspecified depression type: chronic illness or injury Suicidal ideation: acute illness or injury  Risk OTC drugs. Prescription drug management.    Final Clinical Impression(s) / ED Diagnoses Final diagnoses:  None    Rx / DC Orders ED Discharge Orders     None         Charlynne Pander, MD 01/25/23 2104

## 2023-01-25 NOTE — Discharge Instructions (Addendum)
Transfer to [facility] emergency department, Dr. Silverio Lay is the excepting MD

## 2023-01-25 NOTE — ED Triage Notes (Signed)
Patient brought in by [facility] from [facility]. Patient was sent from [facility] to be transferred to [facility] tomorrow morning.

## 2023-01-25 NOTE — ED Provider Notes (Cosign Needed Addendum)
Behavioral HealthOBS/FBC Discharge Summary   Date and Time: 01/25/2023 10:10 AM Name: Samuel Sloan MRN:  [id number]  HPI: Samuel Sloan is an 89 year old male with psychiatric history of depression and anxiety.  Patient presented voluntarily to [facility] on 01/24/2023 reporting worsening depressive symptoms and suicidal ideation with plan to cut his throat. He is recommended for geriatric psychiatric admission.  He will remain on the continuous assessment unit while awaiting bed availability.  Admission note by Cecilio Asper NP on 01/24/2023, "Patient reports worsening depressive symptoms and suicidal ideation over the past 1 month.  He cites financial issues, feeling isolated, and dissatisfaction with divorce settlement as his triggers. He endorses depressive symptoms of crying spells, hopelessness, worthlessness, anhedonia, racing thoughts, anxiety, poor focus, poor appetite and suicidal ideation.  Patient reports that he wrote "Last letter to family members" and contemplated committing suicide last week. He says he gave the letter to his daughter, [name] so she can discuss it with his ex-wife and if ex-wife does not agree to his demands he will commit suicide. Patient presented a copy of the letter to this provider. The letter detailed patient's hope for his family and how he feels his ex-wife shattered his dream of an ideal family and caused him to struggle emotionally and financially. Patient also gave his ex-wife an ultimatum. The letter states "I want you to give [name] $6000 to deposit into my credit card ASAP.  And then every Valentine's Day I want $12,000 deposited to my account. [name] this is no game or bargaining issue.  If you refuse then some day I assure you that you will find a lot of blood in the living room and my body in the midst of it.  I have no reason to go on living..."   Samuel Sloan, 67 y.o., male patient seen face to face by this provider and chart reviewed on 01/25/23.     Subjective:    During evaluation [name] is observed sitting in his bed talking with nurse. He is in no acute distress. He is well groomed and makes good eye contact. He is alert, oriented x 4, calm, cooperative and attentive.  He complains of the noise/echo on the unit due to his hearing aids/cochlear implant.  He continues to endorse depression and has a depressed affect.  He identifies his relationship with his ex-wife, family and financial concerns as his stressors/triggers. Reports it takes his entire check every month to be able to pay for his assisted living facility and he has no money to buy groceries when his bills are paid.  He ruminates about his ex-wife and how much money she currently has and how she does not share any.  He states, "unless she gives me money I am going to slit my throat and bleed all over her floor and in front of her if need be".  Discussed inpatient admission due to safety concerns.  However also explained to patient that an inpatient admission cannot help with his social economic status/concerns.  He states, "well that is okay I will just have nothing to live for".  He cannot contract for safety.  He denies HI/AVH.  He does not appear to be responding to internal/external stimuli.  Patient continues to be in agreement for inpatient psychiatric admission.  Notified at 10:15 AM Notified by [facility] the patient has been declined from [facility].  Social work has faxed patient out and he is under review at [name].   Discussed this with patient and he  is requesting to be discharged home.  Patient meets criteria for involuntary commitment.  IVC petitioned by this Clinical research associate.   Call contact  [name] daughter [phone] at 3:10 PM.  She is upset she feels like no one has kept her informed of patient's status.  She was hoping he would be admitted to [facility] or [facility] geriatric unit.  Explained to daughter that patient has been declined from  both facilities.  Also explained that he was accepted at [facility] and that he was being placed under involuntary commitment.  She expresses her concern that she feels her father will decompensate if he stays in the observation unit for another night. States, "if my father decompensates while he is at your facility I will sue yall". She is adamant that patient be sent to the Emergency room while he waits for IP bed availability.   Nursing staff contacted law enforcement to transfer patient to [facility] as his bed was ready.  However nursing was informed by law enforcement that they will not transport this late in the day.  He will have to be transported in the a.m.   Contacted [name] emergency department and Dr. Silverio Lay is the accepting MD.  Patient will be transported via law enforcement to [facility] long ED while awaiting to be transported to [facility] in the a.m.  Transferring patient to the emergency department will benefit patient greatly.  Patient cannot tolerate the chair beds on the unit.  In addition the echo on the unit was very bothersome. Daughter informed.  She is appreciative that her father is being transferred to the emergency department while awaiting transport.      Media Information     Media Information     Media Information     Media Information     Media Information       Diagnosis:  Final diagnoses:  Severe episode of recurrent major depressive disorder, without psychotic features (HCC)    Total Time spent with patient: 30 minutes  Past Psychiatric History: see H&P Past Medical History:  see H&P Family History:  see H&P Family Psychiatric  History:  see H&P Social History:  see H&P  Additional Social History: see H&P   Pain Medications: See MAR Prescriptions: See MAR Over the Counter: See MAR History of alcohol / drug use?: No history of alcohol / drug abuse Longest period of sobriety (when/how long): n/a Negative Consequences of  Use:  (n/a) Withdrawal Symptoms: None       Sleep: Fair  Appetite:  Fair  Current Medications:  Current Facility-Administered Medications  Medication Dose Route Frequency Provider Last Rate Last Admin   acetaminophen (TYLENOL) tablet 650 mg  650 mg Oral Q6H PRN Ajibola, Ene A, NP   650 mg at 01/25/23 0415   alum & mag hydroxide-simeth (MAALOX/MYLANTA) 200-200-20 MG/5ML suspension 30 mL  30 mL Oral Q4H PRN Ajibola, Ene A, NP       aspirin EC tablet 81 mg  81 mg Oral Daily Ajibola, Ene A, NP   81 mg at 01/25/23 0927   clonazePAM (KLONOPIN) tablet 0.5 mg  0.5 mg Oral TID Ajibola, Ene A, NP   0.5 mg at 01/25/23 0981   lipase/protease/amylase (CREON) capsule 36,000 Units  36,000 Units Oral TID WC Ajibola, Ene A, NP       losartan (COZAAR) tablet 100 mg  100 mg Oral Daily Ajibola, Ene A, NP   100 mg at 01/25/23 0927   magnesium hydroxide (MILK OF  MAGNESIA) suspension 30 mL  30 mL Oral Daily PRN Ajibola, Ene A, NP       melatonin tablet 5 mg  5 mg Oral QHS Ajibola, Ene A, NP   5 mg at 01/25/23 0049   pantoprazole (PROTONIX) EC tablet 40 mg  40 mg Oral Daily Ajibola, Ene A, NP   40 mg at 01/25/23 0927   pramipexole (MIRAPEX) tablet 1 mg  1 mg Oral QHS Ajibola, Ene A, NP   1 mg at 01/25/23 0233   sertraline (ZOLOFT) tablet 100 mg  100 mg Oral QHS Ajibola, Ene A, NP   100 mg at 01/25/23 0049   Current Outpatient Medications  Medication Sig Dispense Refill   aspirin EC 81 MG tablet Take 81 mg by mouth in the morning.     bismuth subsalicylate (PEPTO BISMOL) 262 MG/15ML suspension Take 30 mLs by mouth every 6 (six) hours as needed for indigestion.     CHLORASEPTIC MOUTH PAIN 1.4 % LIQD Use as directed 1 spray in the mouth or throat as needed for throat irritation / pain (related to post-nasal drip).     clonazePAM (KLONOPIN) 0.5 MG tablet Take 0.5 mg by mouth 3 (three) times daily.     diclofenac Sodium (VOLTAREN) 1 % GEL Apply 4 g topically 4 (four) times daily as needed (knee pain). 50 g 0    divalproex (DEPAKOTE ER) 250 MG 24 hr tablet Take 1 tablet (250 mg total) by mouth at bedtime. 30 tablet 0   lipase/protease/amylase (CREON) 36000 UNITS CPEP capsule Take 36,000 Units by mouth See admin instructions. Take 36,000 units by mouth two to three times a day with meals     loperamide (IMODIUM A-D) 2 MG tablet Take 2 mg by mouth 4 (four) times daily as needed for diarrhea or loose stools.     loratadine (CLARITIN) 10 MG tablet Take 1 tablet (10 mg total) by mouth daily as needed for allergies.     losartan (COZAAR) 100 MG tablet Take 100 mg by mouth daily.     meclizine (ANTIVERT) 25 MG tablet Take 25 mg by mouth 3 (three) times daily as needed for dizziness or nausea.     melatonin 1 MG TABS tablet Take 1 mg by mouth at bedtime.     NON FORMULARY Take 1 tablet by mouth See admin instructions. PureOne multivitamin- Take 1 tablet by mouth in the morning     omeprazole (PRILOSEC) 20 MG capsule Take 20 mg by mouth daily before breakfast.     pramipexole (MIRAPEX) 1 MG tablet Take 1 mg by mouth at bedtime.     sertraline (ZOLOFT) 50 MG tablet Take 2 tablets every night (Patient taking differently: Take 50 mg by mouth at bedtime.) 60 tablet 11   traZODone (DESYREL) 50 MG tablet Take 50 mg by mouth at bedtime.     Wheat Dextrin (BENEFIBER) CHEW Chew 1-2 tablets by mouth daily as needed (for constipation).      Labs  Lab Results:  Admission on 01/24/2023  Component Date Value Ref Range Status   WBC 01/24/2023 5.3  4.0 - 10.5 K/uL Final   RBC 01/24/2023 4.69  4.22 - 5.81 MIL/uL Final   Hemoglobin 01/24/2023 14.0  13.0 - 17.0 g/dL Final   HCT 16/05/9603 42.5  39.0 - 52.0 % Final   MCV 01/24/2023 90.6  80.0 - 100.0 fL Final   MCH 01/24/2023 29.9  26.0 - 34.0 pg Final   MCHC 01/24/2023 32.9  30.0 - 36.0  g/dL Final   RDW 16/05/9603 13.6  11.5 - 15.5 % Final   Platelets 01/24/2023 176  150 - 400 K/uL Final   nRBC 01/24/2023 0.0  0.0 - 0.2 % Final   Neutrophils Relative % 01/24/2023 55  %  Final   Neutro Abs 01/24/2023 2.9  1.7 - 7.7 K/uL Final   Lymphocytes Relative 01/24/2023 30  % Final   Lymphs Abs 01/24/2023 1.6  0.7 - 4.0 K/uL Final   Monocytes Relative 01/24/2023 10  % Final   Monocytes Absolute 01/24/2023 0.5  0.1 - 1.0 K/uL Final   Eosinophils Relative 01/24/2023 4  % Final   Eosinophils Absolute 01/24/2023 0.2  0.0 - 0.5 K/uL Final   Basophils Relative 01/24/2023 1  % Final   Basophils Absolute 01/24/2023 0.1  0.0 - 0.1 K/uL Final   Immature Granulocytes 01/24/2023 0  % Final   Abs Immature Granulocytes 01/24/2023 0.02  0.00 - 0.07 K/uL Final   Performed at [facility], [address] [place], [place] [address]   Sodium 01/24/2023 136  135 - 145 mmol/L Final   Potassium 01/24/2023 4.1  3.5 - 5.1 mmol/L Final   Chloride 01/24/2023 101  98 - 111 mmol/L Final   CO2 01/24/2023 27  22 - 32 mmol/L Final   Glucose, Bld 01/24/2023 97  70 - 99 mg/dL Final   Glucose reference range applies only to samples taken after fasting for at least 8 hours.   BUN 01/24/2023 22  8 - 23 mg/dL Final   Creatinine, Ser 01/24/2023 1.32 (H)  0.61 - 1.24 mg/dL Final   Calcium 11/91/4782 9.3  8.9 - 10.3 mg/dL Final   Total Protein 95/62/1308 6.1 (L)  6.5 - 8.1 g/dL Final   Albumin 65/78/4696 3.8  3.5 - 5.0 g/dL Final   AST 29/52/8413 23  15 - 41 U/L Final   ALT 01/24/2023 25  0 - 44 U/L Final   Alkaline Phosphatase 01/24/2023 57  38 - 126 U/L Final   Total Bilirubin 01/24/2023 0.5  0.3 - 1.2 mg/dL Final   GFR, Estimated 01/24/2023 52 (L)  >60 mL/min Final   Comment: (NOTE) Calculated using the CKD-EPI Creatinine Equation (2021)    Anion gap 01/24/2023 8  5 - 15 Final   Performed at [facility], [address] [place], [place] [address]   Hgb A1c MFr Bld 01/24/2023 5.5  4.8 - 5.6 % Final   Comment: (NOTE) Pre diabetes:          5.7%-6.4%  Diabetes:              >6.4%  Glycemic control for   <7.0% adults with diabetes    Mean Plasma Glucose 01/24/2023 111.15  mg/dL  Final   Performed at [facility], [address] [place], [place] [address]   Alcohol, Ethyl (B) 01/24/2023 <10  <10 mg/dL Final   Comment: (NOTE) Lowest detectable limit for serum alcohol is 10 mg/dL.  For medical purposes only. Performed at [facility], [address], [place], [place] [address]    POC Amphetamine UR 01/24/2023 None Detected  NONE DETECTED (Cut Off Level 1000 ng/mL) Final   POC Secobarbital (BAR) 01/24/2023 None Detected  NONE DETECTED (Cut Off Level 300 ng/mL) Final   POC Buprenorphine (BUP) 01/24/2023 None Detected  NONE DETECTED (Cut Off Level 10 ng/mL) Final   POC Oxazepam (BZO) 01/24/2023 None Detected  NONE DETECTED (Cut Off Level 300 ng/mL) Final  POC Cocaine UR 01/24/2023 None Detected  NONE DETECTED (Cut Off Level 300 ng/mL) Final   POC Methamphetamine UR 01/24/2023 None Detected  NONE DETECTED (Cut Off Level 1000 ng/mL) Final   POC Morphine 01/24/2023 None Detected  NONE DETECTED (Cut Off Level 300 ng/mL) Final   POC Methadone UR 01/24/2023 None Detected  NONE DETECTED (Cut Off Level 300 ng/mL) Final   POC Oxycodone UR 01/24/2023 None Detected  NONE DETECTED (Cut Off Level 100 ng/mL) Final   POC Marijuana UR 01/24/2023 None Detected  NONE DETECTED (Cut Off Level 50 ng/mL) Final   Cholesterol 01/24/2023 162  0 - 200 mg/dL Final   Triglycerides 16/05/9603 139  <150 mg/dL Final   HDL 54/04/8118 46  >40 mg/dL Final   Total CHOL/HDL Ratio 01/24/2023 3.5  RATIO Final   VLDL 01/24/2023 28  0 - 40 mg/dL Final   LDL Cholesterol 01/24/2023 88  0 - 99 mg/dL Final   Comment:        Total Cholesterol/HDL:CHD Risk Coronary Heart Disease Risk Table                     Men   Women  1/2 Average Risk   3.4   3.3  Average Risk       5.0   4.4  2 X Average Risk   9.6   7.1  3 X Average Risk  23.4   11.0        Use the calculated Patient Ratio above and the CHD Risk Table to determine the patient's CHD Risk.        ATP III CLASSIFICATION (LDL):  <100      mg/dL   Optimal  147-829  mg/dL   Near or Above                    Optimal  130-159  mg/dL   Borderline  562-130  mg/dL   High  >865     mg/dL   Very High Performed at [facility], [address], [place], [place] [address]    TSH 01/24/2023 4.010  0.350 - 4.500 uIU/mL Final   Comment: Performed by a 3rd Generation assay with a functional sensitivity of <=0.01 uIU/mL. Performed at [facility], [address] [place], [place] [address]   Admission on 09/23/2022, Discharged on 09/27/2022  Component Date Value Ref Range Status   Glucose-Capillary 09/23/2022 121 (H)  70 - 99 mg/dL Final   Glucose reference range applies only to samples taken after fasting for at least 8 hours.   WBC 09/23/2022 7.4  4.0 - 10.5 K/uL Final   RBC 09/23/2022 4.47  4.22 - 5.81 MIL/uL Final   Hemoglobin 09/23/2022 14.0  13.0 - 17.0 g/dL Final   HCT 84/13/2440 41.7  39.0 - 52.0 % Final   MCV 09/23/2022 93.3  80.0 - 100.0 fL Final   MCH 09/23/2022 31.3  26.0 - 34.0 pg Final   MCHC 09/23/2022 33.6  30.0 - 36.0 g/dL Final   RDW 06/15/2535 12.5  11.5 - 15.5 % Final   Platelets 09/23/2022 269  150 - 400 K/uL Final   nRBC 09/23/2022 0.0  0.0 - 0.2 % Final   Neutrophils Relative % 09/23/2022 69  % Final   Neutro Abs 09/23/2022 5.1  1.7 - 7.7 K/uL Final   Lymphocytes Relative 09/23/2022 20  % Final   Lymphs Abs 09/23/2022 1.5  0.7 - 4.0 K/uL Final  Monocytes Relative 09/23/2022 8  % Final   Monocytes Absolute 09/23/2022 0.6  0.1 - 1.0 K/uL Final   Eosinophils Relative 09/23/2022 2  % Final   Eosinophils Absolute 09/23/2022 0.2  0.0 - 0.5 K/uL Final   Basophils Relative 09/23/2022 1  % Final   Basophils Absolute 09/23/2022 0.1  0.0 - 0.1 K/uL Final   Immature Granulocytes 09/23/2022 0  % Final   Abs Immature Granulocytes 09/23/2022 0.02  0.00 - 0.07 K/uL Final   Performed at [facility], [address] [place], [place] [address]   Sodium 09/23/2022 137  135 - 145 mmol/L Final    Potassium 09/23/2022 3.7  3.5 - 5.1 mmol/L Final   Chloride 09/23/2022 102  98 - 111 mmol/L Final   CO2 09/23/2022 26  22 - 32 mmol/L Final   Glucose, Bld 09/23/2022 94  70 - 99 mg/dL Final   Glucose reference range applies only to samples taken after fasting for at least 8 hours.   BUN 09/23/2022 23  8 - 23 mg/dL Final   Creatinine, Ser 09/23/2022 1.22  0.61 - 1.24 mg/dL Final   Calcium 60/45/4098 9.1  8.9 - 10.3 mg/dL Final   Total Protein 11/91/4782 6.7  6.5 - 8.1 g/dL Final   Albumin 95/62/1308 4.1  3.5 - 5.0 g/dL Final   AST 65/78/4696 27  15 - 41 U/L Final   ALT 09/23/2022 19  0 - 44 U/L Final   Alkaline Phosphatase 09/23/2022 61  38 - 126 U/L Final   Total Bilirubin 09/23/2022 0.6  0.3 - 1.2 mg/dL Final   GFR, Estimated 09/23/2022 58 (L)  >60 mL/min Final   Comment: (NOTE) Calculated using the CKD-EPI Creatinine Equation (2021)    Anion gap 09/23/2022 9  5 - 15 Final   Performed at [facility], [address], [place], [place] [address]   Magnesium 09/23/2022 2.2  1.7 - 2.4 mg/dL Final   Performed at [facility], [address], [place], [place] [address]   Color, Urine 09/23/2022 YELLOW  YELLOW Final   APPearance 09/23/2022 HAZY (A)  CLEAR Final   Specific Gravity, Urine 09/23/2022 1.014  1.005 - 1.030 Final   pH 09/23/2022 6.0  5.0 - 8.0 Final   Glucose, UA 09/23/2022 NEGATIVE  NEGATIVE mg/dL Final   Hgb urine dipstick 09/23/2022 NEGATIVE  NEGATIVE Final   Bilirubin Urine 09/23/2022 NEGATIVE  NEGATIVE Final   Ketones, ur 09/23/2022 NEGATIVE  NEGATIVE mg/dL Final   Protein, ur 40/05/2724 NEGATIVE  NEGATIVE mg/dL Final   Nitrite 36/64/4034 NEGATIVE  NEGATIVE Final   Leukocytes,Ua 09/23/2022 LARGE (A)  NEGATIVE Final   RBC / HPF 09/23/2022 6-10  0 - 5 RBC/hpf Final   WBC, UA 09/23/2022 >50  0 - 5 WBC/hpf Final   Bacteria, UA 09/23/2022 NONE SEEN  NONE SEEN Final   Squamous Epithelial / HPF 09/23/2022 0-5  0 - 5 /HPF Final    Performed at [facility], [address] [place], [place] [address]   Sodium 09/24/2022 135  135 - 145 mmol/L Final   Potassium 09/24/2022 3.4 (L)  3.5 - 5.1 mmol/L Final   Chloride 09/24/2022 101  98 - 111 mmol/L Final   CO2 09/24/2022 26  22 - 32 mmol/L Final   Glucose, Bld 09/24/2022 107 (H)  70 - 99 mg/dL Final   Glucose reference range applies only to samples taken after fasting for at least 8 hours.   BUN  09/24/2022 21  8 - 23 mg/dL Final   Creatinine, Ser 09/24/2022 1.18  0.61 - 1.24 mg/dL Final   Calcium 45/40/9811 9.1  8.9 - 10.3 mg/dL Final   GFR, Estimated 09/24/2022 >60  >60 mL/min Final   Comment: (NOTE) Calculated using the CKD-EPI Creatinine Equation (2021)    Anion gap 09/24/2022 8  5 - 15 Final   Performed at [facility], [address], [place], [place] [address]   WBC 09/24/2022 5.9  4.0 - 10.5 K/uL Final   RBC 09/24/2022 4.56  4.22 - 5.81 MIL/uL Final   Hemoglobin 09/24/2022 14.2  13.0 - 17.0 g/dL Final   HCT 29/56/2130 42.6  39.0 - 52.0 % Final   MCV 09/24/2022 93.4  80.0 - 100.0 fL Final   MCH 09/24/2022 31.1  26.0 - 34.0 pg Final   MCHC 09/24/2022 33.3  30.0 - 36.0 g/dL Final   RDW 86/57/8469 12.7  11.5 - 15.5 % Final   Platelets 09/24/2022 200  150 - 400 K/uL Final   nRBC 09/24/2022 0.0  0.0 - 0.2 % Final   Performed at [facility], [address], [place], [place] [address]   Ferritin 09/24/2022 23 (L)  24 - 336 ng/mL Final   Performed at [facility], [address], [place], [place] [address]   TSH 09/24/2022 4.221  0.350 - 4.500 uIU/mL Final   Comment: Performed by a 3rd Generation assay with a functional sensitivity of <=0.01 uIU/mL. Performed at [facility], [address], [place], [place] [address]    Opiates 09/24/2022 NONE DETECTED  NONE DETECTED Final   Cocaine 09/24/2022 NONE DETECTED  NONE DETECTED Final   Benzodiazepines 09/24/2022 NONE DETECTED   NONE DETECTED Final   Amphetamines 09/24/2022 NONE DETECTED  NONE DETECTED Final   Tetrahydrocannabinol 09/24/2022 NONE DETECTED  NONE DETECTED Final   Barbiturates 09/24/2022 NONE DETECTED  NONE DETECTED Final   Comment: (NOTE) DRUG SCREEN FOR MEDICAL PURPOSES ONLY.  IF CONFIRMATION IS NEEDED FOR ANY PURPOSE, NOTIFY LAB WITHIN 5 DAYS.  LOWEST DETECTABLE LIMITS FOR URINE DRUG SCREEN Drug Class                     Cutoff (ng/mL) Amphetamine and metabolites    1000 Barbiturate and metabolites    200 Benzodiazepine                 200 Opiates and metabolites        300 Cocaine and metabolites        300 THC                            50 Performed at [facility], [address], [place], [place] [address]    Sodium 09/25/2022 134 (L)  135 - 145 mmol/L Final   Potassium 09/25/2022 3.9  3.5 - 5.1 mmol/L Final   Chloride 09/25/2022 100  98 - 111 mmol/L Final   CO2 09/25/2022 28  22 - 32 mmol/L Final   Glucose, Bld 09/25/2022 103 (H)  70 - 99 mg/dL Final   Glucose reference range applies only to samples taken after fasting for at least 8 hours.   BUN 09/25/2022 19  8 - 23 mg/dL Final   Creatinine, Ser 09/25/2022 1.34 (H)  0.61 - 1.24 mg/dL Final   Calcium 66/44/0347 9.2  8.9 - 10.3 mg/dL Final   GFR, Estimated 09/25/2022  52 (L)  >60 mL/min Final   Comment: (NOTE) Calculated using the CKD-EPI Creatinine Equation (2021)    Anion gap 09/25/2022 6  5 - 15 Final   Performed at [facility], [address], [place], [place] [address]   Magnesium 09/25/2022 2.0  1.7 - 2.4 mg/dL Final   Performed at [facility], [address], [place], [place] [address]   Sodium 09/26/2022 134 (L)  135 - 145 mmol/L Final   Potassium 09/26/2022 4.7  3.5 - 5.1 mmol/L Final   Chloride 09/26/2022 96 (L)  98 - 111 mmol/L Final   CO2 09/26/2022 29  22 - 32 mmol/L Final   Glucose, Bld 09/26/2022 105 (H)  70 - 99 mg/dL Final   Glucose reference range applies only to samples taken  after fasting for at least 8 hours.   BUN 09/26/2022 24 (H)  8 - 23 mg/dL Final   Creatinine, Ser 09/26/2022 1.47 (H)  0.61 - 1.24 mg/dL Final   Calcium 95/62/1308 9.4  8.9 - 10.3 mg/dL Final   GFR, Estimated 09/26/2022 46 (L)  >60 mL/min Final   Comment: (NOTE) Calculated using the CKD-EPI Creatinine Equation (2021)    Anion gap 09/26/2022 9  5 - 15 Final   Performed at [facility], [address] [place], [place] [address]   Magnesium 09/26/2022 2.1  1.7 - 2.4 mg/dL Final   Performed at [facility], [address], [place], [place] [address]  Appointment on 09/06/2022  Component Date Value Ref Range Status   Area-P 1/2 09/06/2022 2.09  cm2 Final   S' Lateral 09/06/2022 2.70  cm Final   AV Area mean vel 09/06/2022 1.40  cm2 Final   AR max vel 09/06/2022 1.43  cm2 Final   AV Area VTI 09/06/2022 1.40  cm2 Final   Ao pk vel 09/06/2022 2.20  m/s Final   AV Mean grad 09/06/2022 9.0  mmHg Final   AV Peak grad 09/06/2022 19.4  mmHg Final   Est EF 09/06/2022 60 - 65%   Final  Admission on 07/31/2022, Discharged on 07/31/2022  Component Date Value Ref Range Status   Glucose-Capillary 07/31/2022 99  70 - 99 mg/dL Final   Glucose reference range applies only to samples taken after fasting for at least 8 hours.   Sodium 07/31/2022 136  135 - 145 mmol/L Final   Potassium 07/31/2022 3.4 (L)  3.5 - 5.1 mmol/L Final   Chloride 07/31/2022 102  98 - 111 mmol/L Final   CO2 07/31/2022 24  22 - 32 mmol/L Final   Glucose, Bld 07/31/2022 93  70 - 99 mg/dL Final   Glucose reference range applies only to samples taken after fasting for at least 8 hours.   BUN 07/31/2022 25 (H)  8 - 23 mg/dL Final   Creatinine, Ser 07/31/2022 1.52 (H)  0.61 - 1.24 mg/dL Final   Calcium 52/84/1324 9.4  8.9 - 10.3 mg/dL Final   Total Protein 40/05/2724 6.3 (L)  6.5 - 8.1 g/dL Final   Albumin 36/64/4034 4.0  3.5 - 5.0 g/dL Final   AST 74/25/9563 31  15 - 41 U/L Final   ALT 07/31/2022 20  0 - 44 U/L Final    Alkaline Phosphatase 07/31/2022 68  38 - 126 U/L Final   Total Bilirubin 07/31/2022 0.3  0.3 - 1.2 mg/dL Final   GFR, Estimated 07/31/2022 44 (L)  >60 mL/min Final   Comment: (NOTE) Calculated using the  CKD-EPI Creatinine Equation (2021)    Anion gap 07/31/2022 10  5 - 15 Final   Performed at [facility], [address], [place], [place] [address]   WBC 07/31/2022 7.9  4.0 - 10.5 K/uL Final   RBC 07/31/2022 4.75  4.22 - 5.81 MIL/uL Final   Hemoglobin 07/31/2022 15.3  13.0 - 17.0 g/dL Final   HCT 65/78/4696 42.9  39.0 - 52.0 % Final   MCV 07/31/2022 90.3  80.0 - 100.0 fL Final   MCH 07/31/2022 32.2  26.0 - 34.0 pg Final   MCHC 07/31/2022 35.7  30.0 - 36.0 g/dL Final   RDW 29/52/8413 12.9  11.5 - 15.5 % Final   Platelets 07/31/2022 216  150 - 400 K/uL Final   nRBC 07/31/2022 0.0  0.0 - 0.2 % Final   Neutrophils Relative % 07/31/2022 69  % Final   Neutro Abs 07/31/2022 5.4  1.7 - 7.7 K/uL Final   Lymphocytes Relative 07/31/2022 20  % Final   Lymphs Abs 07/31/2022 1.6  0.7 - 4.0 K/uL Final   Monocytes Relative 07/31/2022 7  % Final   Monocytes Absolute 07/31/2022 0.6  0.1 - 1.0 K/uL Final   Eosinophils Relative 07/31/2022 3  % Final   Eosinophils Absolute 07/31/2022 0.3  0.0 - 0.5 K/uL Final   Basophils Relative 07/31/2022 1  % Final   Basophils Absolute 07/31/2022 0.1  0.0 - 0.1 K/uL Final   Immature Granulocytes 07/31/2022 0  % Final   Abs Immature Granulocytes 07/31/2022 0.02  0.00 - 0.07 K/uL Final   Performed at [facility], [address] [place], [place] [address]   Alcohol, Ethyl (B) 07/31/2022 <10  <10 mg/dL Final   Comment: (NOTE) Lowest detectable limit for serum alcohol is 10 mg/dL.  For medical purposes only. Performed at [facility], [address] [place], [place] [address]    D-Dimer, Quant 07/31/2022 0.59 (H)  0.00 - 0.50 ug/mL-FEU Final   Comment: (NOTE) At the manufacturer cut-off value of 0.5 g/mL FEU, this assay has a negative  predictive value of 95-100%.This assay is intended for use in conjunction with a clinical pretest probability (PTP) assessment model to exclude pulmonary embolism (PE) and deep venous thrombosis (DVT) in outpatients suspected of PE or DVT. Results should be correlated with clinical presentation. Performed at [facility], [address], [place], [place] [address]    Glucose-Capillary 07/31/2022 86  70 - 99 mg/dL Final   Glucose reference range applies only to samples taken after fasting for at least 8 hours.    Blood Alcohol level:  Lab Results  Component Value Date   ETH <10 01/24/2023   ETH <10 07/31/2022    Metabolic Disorder Labs: Lab Results  Component Value Date   HGBA1C 5.5 01/24/2023   MPG 111.15 01/24/2023   No results found for: "PROLACTIN" Lab Results  Component Value Date   CHOL 162 01/24/2023   TRIG 139 01/24/2023   HDL 46 01/24/2023   CHOLHDL 3.5 01/24/2023   VLDL 28 01/24/2023   LDLCALC 88 01/24/2023    Therapeutic Lab Levels: No results found for: "LITHIUM" No results found for: "VALPROATE" No results found for: "CBMZ"  Physical Findings   Mini-Mental    Flowsheet Row Office Visit from 05/02/2016 in [facility] Office Visit from 11/14/2015 in [facility]  Total Score (max 30 points ) 30 29      Flowsheet Row ED from  01/24/2023 in [facility] ED to Hosp-Admission (Discharged) from 09/23/2022 in [facility] from 07/31/2022 in [facility] Department at [facility]  C-SSRS RISK CATEGORY High Risk No Risk No Risk        Musculoskeletal  Strength & Muscle Tone: within normal limits Gait & Station: unsteady- walker at bedside, encouraged pt to use when ambulating  Patient leans: N/A  Psychiatric Specialty Exam  Presentation  General Appearance:  Appropriate for Environment; Casual  Eye Contact: Good  Speech: Clear and Coherent; Normal  Rate  Speech Volume: Normal  Handedness: Right   Mood and Affect  Mood: Depressed  Affect: Congruent   Thought Process  Thought Processes: Coherent  Descriptions of Associations:Intact  Orientation:Full (Time, Place and Person)  Thought Content:Logical  Diagnosis of Schizophrenia or Schizoaffective disorder in past: No    Hallucinations:Hallucinations: None  Ideas of Reference:None  Suicidal Thoughts:Suicidal Thoughts: Yes, Active SI Active Intent and/or Plan: With Intent; With Plan; With Means to Carry Out  Homicidal Thoughts:Homicidal Thoughts: No   Sensorium  Memory: Immediate Good; Recent Good; Remote Good  Judgment: Poor  Insight: Poor   Executive Functions  Concentration: Fair  Attention Span: Fair  Recall: Good  Fund of Knowledge: Good  Language: Good   Psychomotor Activity  Psychomotor Activity: Psychomotor Activity: Normal   Assets  Assets: Communication Skills; Desire for Improvement; Housing; Leisure Time; Physical Health; Resilience   Sleep  Sleep: Sleep: Good Number of Hours of Sleep: 8   Nutritional Assessment (For OBS and FBC admissions only) Has the patient had a weight loss or gain of 10 pounds or more in the last 3 months?: No Has the patient had a decrease in food intake/or appetite?: Yes Does the patient have dental problems?: No Does the patient have eating habits or behaviors that may be indicators of an eating disorder including binging or inducing vomiting?: No Has the patient recently lost weight without trying?: 0 Has the patient been eating poorly because of a decreased appetite?: 0 Malnutrition Screening Tool Score: 0    Physical Exam  Physical Exam Vitals and nursing note reviewed.  Constitutional:      General: He is not in acute distress.    Appearance: He is well-developed.  HENT:     Head: Normocephalic.  Eyes:     General:        Right eye: No discharge.        Left eye: No  discharge.  Cardiovascular:     Rate and Rhythm: Normal rate.  Pulmonary:     Effort: Pulmonary effort is normal. No respiratory distress.  Musculoskeletal:     Cervical back: Normal range of motion.     Comments: Observed ambulating, he does not appear to be unsteady at this time. However, walker is at bedside. Encouraged pt to use when up and ambulating.   Skin:    Coloration: Skin is not jaundiced or pale.  Neurological:     Mental Status: He is alert and oriented to person, place, and time.  Psychiatric:        Attention and Perception: Attention and perception normal.        Mood and Affect: Mood is depressed.        Speech: Speech normal.        Behavior: Behavior normal. Behavior is cooperative.        Thought Content: Thought content includes suicidal ideation. Thought content includes suicidal plan.  Cognition and Memory: Cognition normal.        Judgment: Judgment normal.   Review of Systems  Constitutional: Negative.   HENT: Negative.    Eyes: Negative.   Respiratory: Negative.    Cardiovascular: Negative.   Musculoskeletal: Negative.   Skin: Negative.   Neurological: Negative.   Endo/Heme/Allergies: Negative.   Psychiatric/Behavioral:  Positive for depression and suicidal ideas.    Blood pressure (!) 148/72, temperature (!) 97.5 F (36.4 C), temperature source Oral, resp. rate 18, SpO2 95 %. There is no height or weight on file to calculate BMI.  Treatment Plan Summary: Will continue to have Daily contact with patient to assess and evaluate symptoms and progress in treatment and Medication management  Pt is recommended for IP Gero admission, [facility] notified and SW notified.  It has been declined from [facility].  He is under review at [facility] emergency department and Dr. Silverio Lay is the excepting MD.  Patient will be transported via law enforcement to [facility] long ED.    Ardis Hughs, NP 01/25/2023 10:10 AM

## 2023-01-26 MED ORDER — ARTIFICIAL TEARS OPHTHALMIC OINT
TOPICAL_OINTMENT | Freq: Once | OPHTHALMIC | Status: DC
  Filled 2023-01-26: qty 3.5

## 2023-01-26 NOTE — Progress Notes (Signed)
Due to transportation issues, the patient won't be able to transport to [facility] until tomorrow. CSW has informed [facility] who has agreed to hold the patient's bed.   Damita Dunnings, MSW, LCSW-A  11:28 AM 01/26/2023

## 2023-01-26 NOTE — ED Notes (Signed)
Received call from [facility] Department. Pt will transport tomorrow. Called pt daughter and updated her on transport.  Pt daughter at bedside 9:15-10:45 AM.

## 2023-01-26 NOTE — ED Notes (Signed)
Pt belongings placed In locker 28.2 bags

## 2023-01-26 NOTE — ED Notes (Signed)
[facility] Department said the pt is scheduled for transport "first thing tomorrow morning."

## 2023-01-27 DIAGNOSIS — N183 Chronic kidney disease, stage 3 unspecified: Secondary | ICD-10-CM | POA: Diagnosis not present

## 2023-01-27 DIAGNOSIS — R946 Abnormal results of thyroid function studies: Secondary | ICD-10-CM | POA: Diagnosis not present

## 2023-01-27 DIAGNOSIS — R45851 Suicidal ideations: Secondary | ICD-10-CM | POA: Diagnosis not present

## 2023-01-27 DIAGNOSIS — I129 Hypertensive chronic kidney disease with stage 1 through stage 4 chronic kidney disease, or unspecified chronic kidney disease: Secondary | ICD-10-CM | POA: Diagnosis not present

## 2023-01-27 DIAGNOSIS — R030 Elevated blood-pressure reading, without diagnosis of hypertension: Secondary | ICD-10-CM | POA: Diagnosis not present

## 2023-01-27 DIAGNOSIS — R7989 Other specified abnormal findings of blood chemistry: Secondary | ICD-10-CM | POA: Diagnosis not present

## 2023-01-27 DIAGNOSIS — Z66 Do not resuscitate: Secondary | ICD-10-CM | POA: Diagnosis not present

## 2023-01-27 DIAGNOSIS — G4733 Obstructive sleep apnea (adult) (pediatric): Secondary | ICD-10-CM | POA: Diagnosis not present

## 2023-01-27 DIAGNOSIS — F419 Anxiety disorder, unspecified: Secondary | ICD-10-CM | POA: Diagnosis not present

## 2023-01-27 DIAGNOSIS — F32A Depression, unspecified: Secondary | ICD-10-CM | POA: Diagnosis not present

## 2023-01-27 DIAGNOSIS — S069X0S Unspecified intracranial injury without loss of consciousness, sequela: Secondary | ICD-10-CM | POA: Diagnosis not present

## 2023-01-27 DIAGNOSIS — G2581 Restless legs syndrome: Secondary | ICD-10-CM | POA: Diagnosis not present

## 2023-01-27 DIAGNOSIS — Z87820 Personal history of traumatic brain injury: Secondary | ICD-10-CM | POA: Diagnosis not present

## 2023-01-27 DIAGNOSIS — K219 Gastro-esophageal reflux disease without esophagitis: Secondary | ICD-10-CM | POA: Diagnosis not present

## 2023-01-27 NOTE — ED Provider Notes (Signed)
Emergency Medicine Observation Re-evaluation Note  Samuel Sloan is a 89 y.o. male, seen on rounds today.  Pt initially presented to the ED for complaints of Medical Clearance and IVC Currently, the patient is awaiting transfer to inpatient geropsych unit at [facility].  Physical Exam  BP (!) 163/87 (BP Location: Left Arm)   Pulse (!) 58   Temp 97.8 F (36.6 C) (Oral)   Resp 20   SpO2 96%  Physical Exam General: Walking in room without difficulty no acute distress. Cardiac: No murmur on my exam Lungs: Lungs clear on my exam Psych: Patient not wanting to speak but smiling and combing his hair.  He reports "I do not talk much, because I do not care much".  No acute agitation at this time.  ED Course / MDM  EKG:   I have reviewed the labs performed to date as well as medications administered while in observation.  Recent changes in the last 24 hours include none reported by nursing, patient to be transferred this morning.  Plan  Current plan is for transfer to [facility] for inpatient geropsych as recommended by psychiatric consultation team over the last 2 days.  Patient's care accepted by Dr. Renato Gails per documentation.  Will fill out EMTALA documentation and patient will be transferred.Rush Landmark, Canary Brim, MD 01/27/23 [phone]

## 2023-01-27 NOTE — ED Notes (Signed)
Pt alert and calm throughout the shift. Pt ambulated to bathroom throughout the night. Pt adhered to medication schedule and slept for most of the night.

## 2023-01-27 NOTE — ED Notes (Signed)
Patients daughter called with update on transfer.

## 2023-01-27 NOTE — ED Notes (Signed)
Handoff/report called to [facility].

## 2023-01-28 DIAGNOSIS — F32A Depression, unspecified: Secondary | ICD-10-CM | POA: Diagnosis not present

## 2023-01-28 DIAGNOSIS — R7989 Other specified abnormal findings of blood chemistry: Secondary | ICD-10-CM | POA: Diagnosis not present

## 2023-01-28 DIAGNOSIS — S069X0S Unspecified intracranial injury without loss of consciousness, sequela: Secondary | ICD-10-CM | POA: Diagnosis not present

## 2023-01-29 DIAGNOSIS — F419 Anxiety disorder, unspecified: Secondary | ICD-10-CM | POA: Diagnosis not present

## 2023-01-29 DIAGNOSIS — R7989 Other specified abnormal findings of blood chemistry: Secondary | ICD-10-CM | POA: Diagnosis not present

## 2023-01-29 DIAGNOSIS — F32A Depression, unspecified: Secondary | ICD-10-CM | POA: Diagnosis not present

## 2023-01-29 DIAGNOSIS — S069X0S Unspecified intracranial injury without loss of consciousness, sequela: Secondary | ICD-10-CM | POA: Diagnosis not present

## 2023-01-30 DIAGNOSIS — S069X0S Unspecified intracranial injury without loss of consciousness, sequela: Secondary | ICD-10-CM | POA: Diagnosis not present

## 2023-01-30 DIAGNOSIS — R7989 Other specified abnormal findings of blood chemistry: Secondary | ICD-10-CM | POA: Diagnosis not present

## 2023-01-30 DIAGNOSIS — F32A Depression, unspecified: Secondary | ICD-10-CM | POA: Diagnosis not present

## 2023-01-31 ENCOUNTER — Telehealth: Payer: Self-pay

## 2023-01-31 DIAGNOSIS — F32A Depression, unspecified: Secondary | ICD-10-CM | POA: Diagnosis not present

## 2023-01-31 DIAGNOSIS — S069X0S Unspecified intracranial injury without loss of consciousness, sequela: Secondary | ICD-10-CM | POA: Diagnosis not present

## 2023-01-31 DIAGNOSIS — R030 Elevated blood-pressure reading, without diagnosis of hypertension: Secondary | ICD-10-CM | POA: Diagnosis not present

## 2023-01-31 DIAGNOSIS — R7989 Other specified abnormal findings of blood chemistry: Secondary | ICD-10-CM | POA: Diagnosis not present

## 2023-01-31 NOTE — Telephone Encounter (Signed)
Transition Care Management Follow-up Telephone Call Date of discharge and from where: [facility] 6/10  How have you been since you were released from the hospital? Doing ok, PT IS CURRENTLY IN THE HOSPITAL Any questions or concerns? No  Items Reviewed: Did the pt receive and understand the discharge instructions provided? Yes  Medications obtained and verified? Yes  Other?  Any new allergies since your discharge?  Dietary orders reviewed?  Do you have support at home? Yes     Follow up appointments reviewed:  PCP Hospital f/u appt confirmed?  Scheduled to see  on @ . Specialist Hospital f/u appt confirmed?  Scheduled to see  on @  Are transportation arrangements needed? YES If their condition worsens, is the pt aware to call PCP or go to the Emergency Dept.?  Was the patient provided with contact information for the PCP's office or ED?  Was to pt encouraged to call back with questions or concerns?

## 2023-02-03 DIAGNOSIS — R2681 Unsteadiness on feet: Secondary | ICD-10-CM | POA: Diagnosis not present

## 2023-02-03 DIAGNOSIS — R296 Repeated falls: Secondary | ICD-10-CM | POA: Diagnosis not present

## 2023-02-03 DIAGNOSIS — R2689 Other abnormalities of gait and mobility: Secondary | ICD-10-CM | POA: Diagnosis not present

## 2023-02-05 DIAGNOSIS — R2689 Other abnormalities of gait and mobility: Secondary | ICD-10-CM | POA: Diagnosis not present

## 2023-02-05 DIAGNOSIS — R296 Repeated falls: Secondary | ICD-10-CM | POA: Diagnosis not present

## 2023-02-05 DIAGNOSIS — R2681 Unsteadiness on feet: Secondary | ICD-10-CM | POA: Diagnosis not present

## 2023-02-06 DIAGNOSIS — R4189 Other symptoms and signs involving cognitive functions and awareness: Secondary | ICD-10-CM | POA: Diagnosis not present

## 2023-02-06 DIAGNOSIS — E038 Other specified hypothyroidism: Secondary | ICD-10-CM | POA: Diagnosis not present

## 2023-02-06 DIAGNOSIS — M19072 Primary osteoarthritis, left ankle and foot: Secondary | ICD-10-CM | POA: Diagnosis not present

## 2023-02-06 DIAGNOSIS — H9201 Otalgia, right ear: Secondary | ICD-10-CM | POA: Diagnosis not present

## 2023-02-06 DIAGNOSIS — F330 Major depressive disorder, recurrent, mild: Secondary | ICD-10-CM | POA: Diagnosis not present

## 2023-02-06 DIAGNOSIS — R45851 Suicidal ideations: Secondary | ICD-10-CM | POA: Diagnosis not present

## 2023-02-06 DIAGNOSIS — F3341 Major depressive disorder, recurrent, in partial remission: Secondary | ICD-10-CM | POA: Diagnosis not present

## 2023-02-06 DIAGNOSIS — M7989 Other specified soft tissue disorders: Secondary | ICD-10-CM | POA: Diagnosis not present

## 2023-02-06 DIAGNOSIS — F411 Generalized anxiety disorder: Secondary | ICD-10-CM | POA: Diagnosis not present

## 2023-02-06 DIAGNOSIS — G4733 Obstructive sleep apnea (adult) (pediatric): Secondary | ICD-10-CM | POA: Diagnosis not present

## 2023-02-06 DIAGNOSIS — R946 Abnormal results of thyroid function studies: Secondary | ICD-10-CM | POA: Diagnosis not present

## 2023-02-10 DIAGNOSIS — R278 Other lack of coordination: Secondary | ICD-10-CM | POA: Diagnosis not present

## 2023-02-10 DIAGNOSIS — R296 Repeated falls: Secondary | ICD-10-CM | POA: Diagnosis not present

## 2023-02-10 DIAGNOSIS — R488 Other symbolic dysfunctions: Secondary | ICD-10-CM | POA: Diagnosis not present

## 2023-02-10 DIAGNOSIS — F4381 Prolonged grief disorder: Secondary | ICD-10-CM | POA: Diagnosis not present

## 2023-02-10 DIAGNOSIS — F445 Conversion disorder with seizures or convulsions: Secondary | ICD-10-CM | POA: Diagnosis not present

## 2023-02-12 DIAGNOSIS — R2681 Unsteadiness on feet: Secondary | ICD-10-CM | POA: Diagnosis not present

## 2023-02-12 DIAGNOSIS — R296 Repeated falls: Secondary | ICD-10-CM | POA: Diagnosis not present

## 2023-02-12 DIAGNOSIS — R2689 Other abnormalities of gait and mobility: Secondary | ICD-10-CM | POA: Diagnosis not present

## 2023-02-13 DIAGNOSIS — R488 Other symbolic dysfunctions: Secondary | ICD-10-CM | POA: Diagnosis not present

## 2023-02-13 DIAGNOSIS — F330 Major depressive disorder, recurrent, mild: Secondary | ICD-10-CM | POA: Diagnosis not present

## 2023-02-13 DIAGNOSIS — R278 Other lack of coordination: Secondary | ICD-10-CM | POA: Diagnosis not present

## 2023-02-13 DIAGNOSIS — R296 Repeated falls: Secondary | ICD-10-CM | POA: Diagnosis not present

## 2023-02-17 DIAGNOSIS — R296 Repeated falls: Secondary | ICD-10-CM | POA: Diagnosis not present

## 2023-02-17 DIAGNOSIS — R2681 Unsteadiness on feet: Secondary | ICD-10-CM | POA: Diagnosis not present

## 2023-02-17 DIAGNOSIS — R2689 Other abnormalities of gait and mobility: Secondary | ICD-10-CM | POA: Diagnosis not present

## 2023-02-18 DIAGNOSIS — S065XAS Traumatic subdural hemorrhage with loss of consciousness status unknown, sequela: Secondary | ICD-10-CM | POA: Diagnosis not present

## 2023-02-18 DIAGNOSIS — F0671 Mild neurocognitive disorder due to known physiological condition with behavioral disturbance: Secondary | ICD-10-CM | POA: Diagnosis not present

## 2023-02-18 DIAGNOSIS — F445 Conversion disorder with seizures or convulsions: Secondary | ICD-10-CM | POA: Diagnosis not present

## 2023-02-18 DIAGNOSIS — F064 Anxiety disorder due to known physiological condition: Secondary | ICD-10-CM | POA: Diagnosis not present

## 2023-02-20 DIAGNOSIS — F330 Major depressive disorder, recurrent, mild: Secondary | ICD-10-CM | POA: Diagnosis not present

## 2023-02-21 DIAGNOSIS — R488 Other symbolic dysfunctions: Secondary | ICD-10-CM | POA: Diagnosis not present

## 2023-02-21 DIAGNOSIS — R296 Repeated falls: Secondary | ICD-10-CM | POA: Diagnosis not present

## 2023-02-21 DIAGNOSIS — R278 Other lack of coordination: Secondary | ICD-10-CM | POA: Diagnosis not present

## 2023-02-26 DIAGNOSIS — R488 Other symbolic dysfunctions: Secondary | ICD-10-CM | POA: Diagnosis not present

## 2023-02-26 DIAGNOSIS — R296 Repeated falls: Secondary | ICD-10-CM | POA: Diagnosis not present

## 2023-02-26 DIAGNOSIS — R278 Other lack of coordination: Secondary | ICD-10-CM | POA: Diagnosis not present

## 2023-02-26 DIAGNOSIS — N50811 Right testicular pain: Secondary | ICD-10-CM | POA: Diagnosis not present

## 2023-02-27 DIAGNOSIS — F330 Major depressive disorder, recurrent, mild: Secondary | ICD-10-CM | POA: Diagnosis not present

## 2023-02-28 DIAGNOSIS — R2689 Other abnormalities of gait and mobility: Secondary | ICD-10-CM | POA: Diagnosis not present

## 2023-02-28 DIAGNOSIS — R2681 Unsteadiness on feet: Secondary | ICD-10-CM | POA: Diagnosis not present

## 2023-02-28 DIAGNOSIS — R296 Repeated falls: Secondary | ICD-10-CM | POA: Diagnosis not present

## 2023-03-03 DIAGNOSIS — R2689 Other abnormalities of gait and mobility: Secondary | ICD-10-CM | POA: Diagnosis not present

## 2023-03-03 DIAGNOSIS — R296 Repeated falls: Secondary | ICD-10-CM | POA: Diagnosis not present

## 2023-03-03 DIAGNOSIS — R2681 Unsteadiness on feet: Secondary | ICD-10-CM | POA: Diagnosis not present

## 2023-03-05 DIAGNOSIS — R296 Repeated falls: Secondary | ICD-10-CM | POA: Diagnosis not present

## 2023-03-05 DIAGNOSIS — R278 Other lack of coordination: Secondary | ICD-10-CM | POA: Diagnosis not present

## 2023-03-05 DIAGNOSIS — R488 Other symbolic dysfunctions: Secondary | ICD-10-CM | POA: Diagnosis not present

## 2023-03-06 DIAGNOSIS — F330 Major depressive disorder, recurrent, mild: Secondary | ICD-10-CM | POA: Diagnosis not present

## 2023-03-12 DIAGNOSIS — R278 Other lack of coordination: Secondary | ICD-10-CM | POA: Diagnosis not present

## 2023-03-12 DIAGNOSIS — R488 Other symbolic dysfunctions: Secondary | ICD-10-CM | POA: Diagnosis not present

## 2023-03-12 DIAGNOSIS — R296 Repeated falls: Secondary | ICD-10-CM | POA: Diagnosis not present

## 2023-03-13 DIAGNOSIS — R2689 Other abnormalities of gait and mobility: Secondary | ICD-10-CM | POA: Diagnosis not present

## 2023-03-13 DIAGNOSIS — R296 Repeated falls: Secondary | ICD-10-CM | POA: Diagnosis not present

## 2023-03-13 DIAGNOSIS — R2681 Unsteadiness on feet: Secondary | ICD-10-CM | POA: Diagnosis not present

## 2023-03-19 DIAGNOSIS — I10 Essential (primary) hypertension: Secondary | ICD-10-CM | POA: Diagnosis not present

## 2023-03-19 DIAGNOSIS — R296 Repeated falls: Secondary | ICD-10-CM | POA: Diagnosis not present

## 2023-03-19 DIAGNOSIS — R278 Other lack of coordination: Secondary | ICD-10-CM | POA: Diagnosis not present

## 2023-03-19 DIAGNOSIS — R488 Other symbolic dysfunctions: Secondary | ICD-10-CM | POA: Diagnosis not present

## 2023-04-04 ENCOUNTER — Ambulatory Visit: Payer: Medicare HMO | Admitting: Internal Medicine

## 2023-04-10 ENCOUNTER — Emergency Department (HOSPITAL_COMMUNITY)
Admission: EM | Admit: 2023-04-10 | Discharge: 2023-04-10 | Disposition: A | Discharge status: Home / Self Care | Payer: Medicare HMO | Attending: Emergency Medicine | Admitting: Emergency Medicine

## 2023-04-10 ENCOUNTER — Other Ambulatory Visit: Payer: Self-pay

## 2023-04-10 DIAGNOSIS — N189 Chronic kidney disease, unspecified: Secondary | ICD-10-CM | POA: Insufficient documentation

## 2023-04-10 DIAGNOSIS — T50901A Poisoning by unspecified drugs, medicaments and biological substances, accidental (unintentional), initial encounter: Secondary | ICD-10-CM | POA: Insufficient documentation

## 2023-04-10 DIAGNOSIS — I129 Hypertensive chronic kidney disease with stage 1 through stage 4 chronic kidney disease, or unspecified chronic kidney disease: Secondary | ICD-10-CM | POA: Diagnosis not present

## 2023-04-10 LAB — CBG MONITORING, ED: Glucose-Capillary: 97 mg/dL (ref 70–99)
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
## 2023-04-10 NOTE — Discharge Instructions (Addendum)
You were seen today for taking all of your medications at once. While you were here we monitored your vitals, performed a physical exam, and checked an EKG and your blood sugar. These were all reassuring and there is no indication for any further testing or intervention in the emergency department at this time.  We have placed an order for you to be seen and evaluated by home health to arrange for help with medication administration, vital sign checks, and any other additional workup or help that she might need around the house regarding medical assistance.  Things to do:  - Follow up with your primary care provider within the next 1-2 weeks -Follow-up with home health to establish home health care services.  Return to the emergency department if you have any new or worsening symptoms including chest pain, shortness of breath, altered mental status, abdominal pain, fevers, or if you have any other concerns.

## 2023-04-10 NOTE — ED Triage Notes (Signed)
Patient BIB [facility] from [place], patient missed last night medications and didn't want family to know so he double up on medications. Night medications with morning medications. Drowsy with delayed speech. 140/92, SBR 48, 98%ra, CBG 114. Past stroke and TBI. A&O3.

## 2023-04-14 ENCOUNTER — Emergency Department (HOSPITAL_COMMUNITY)
Admission: EM | Admit: 2023-04-14 | Discharge: 2023-04-14 | Disposition: A | Discharge status: Home / Self Care | Payer: Medicare HMO | Attending: Emergency Medicine | Admitting: Emergency Medicine

## 2023-04-14 ENCOUNTER — Encounter (HOSPITAL_COMMUNITY): Payer: Self-pay

## 2023-04-14 ENCOUNTER — Emergency Department (HOSPITAL_COMMUNITY): Payer: Medicare HMO

## 2023-04-14 ENCOUNTER — Other Ambulatory Visit: Payer: Self-pay

## 2023-04-14 DIAGNOSIS — W010XXA Fall on same level from slipping, tripping and stumbling without subsequent striking against object, initial encounter: Secondary | ICD-10-CM | POA: Diagnosis not present

## 2023-04-14 DIAGNOSIS — S0990XA Unspecified injury of head, initial encounter: Secondary | ICD-10-CM | POA: Insufficient documentation

## 2023-04-14 DIAGNOSIS — S93401A Sprain of unspecified ligament of right ankle, initial encounter: Secondary | ICD-10-CM | POA: Insufficient documentation

## 2023-04-14 DIAGNOSIS — Z7982 Long term (current) use of aspirin: Secondary | ICD-10-CM | POA: Diagnosis not present

## 2023-04-14 DIAGNOSIS — W19XXXA Unspecified fall, initial encounter: Secondary | ICD-10-CM

## 2023-04-14 DIAGNOSIS — S99911A Unspecified injury of right ankle, initial encounter: Secondary | ICD-10-CM | POA: Diagnosis present

## 2023-04-14 MED ORDER — ACETAMINOPHEN 500 MG PO TABS
1000.0000 mg | ORAL_TABLET | Freq: Once | ORAL | Status: AC
  Administered 2023-04-14: 1000 mg via ORAL
  Filled 2023-04-14: qty 2

## 2023-04-14 MED ORDER — MELOXICAM 7.5 MG PO TABS
7.5000 mg | ORAL_TABLET | Freq: Every day | ORAL | Status: DC
  Administered 2023-04-14: 7.5 mg via ORAL
  Filled 2023-04-14 (×2): qty 1

## 2023-04-14 NOTE — ED Provider Notes (Signed)
[facility] EMERGENCY DEPARTMENT AT [facility] Provider Note   CSN: [id number] Arrival date & time: 04/14/23  1553     History  Chief Complaint  Patient presents with   Samuel Sloan is a 89 y.o. male presenting from home with a mechanical fall.  The patient lives independently.  He is here with his daughter.  He reports that he was getting up using his walker and he lost his balance, tipped backwards and fell onto his bottom, striking his head also on the dresser.  He reports pain and soreness in his right ankle with bearing weight.  His daughter reports that the patient has been having ongoing balance difficulty, and that he is "approaching the point where he needs more full-time care".  He is not on anticoagulation  HPI     Home Medications Prior to Admission medications   Medication Sig Start Date End Date Taking? Authorizing Provider  acetaminophen (TYLENOL) 650 MG CR tablet Take 1,300 mg by mouth in the morning and at bedtime.    [provider]  aspirin EC 81 MG tablet Take 81 mg by mouth in the morning.    [provider]  bismuth subsalicylate (PEPTO BISMOL) 262 MG/15ML suspension Take 30 mLs by mouth every 6 (six) hours as needed for indigestion.    [provider]  CHLORASEPTIC MOUTH PAIN 1.4 % LIQD Use as directed 1 spray in the mouth or throat as needed for throat irritation / pain (related to post-nasal drip).    [provider]  clonazePAM (KLONOPIN) 0.5 MG tablet Take 0.5 mg by mouth 3 (three) times daily.    [provider]  diclofenac Sodium (VOLTAREN) 1 % GEL Apply 2-4 g topically 4 (four) times daily as needed (pain).    [provider]  hydroxypropyl methylcellulose / hypromellose (ISOPTO TEARS / GONIOVISC) 2.5 % ophthalmic solution 2 drops 4 (four) times daily as needed for dry eyes.    [provider]  lipase/protease/amylase (CREON) 36000 UNITS CPEP capsule Take 72,000 Units by  mouth 3 (three) times daily before meals. Take 36,000 units (1 cap) with snacks as needed    [provider]  loperamide (IMODIUM A-D) 2 MG tablet Take 2 mg by mouth 4 (four) times daily as needed for diarrhea or loose stools.    [provider]  losartan (COZAAR) 100 MG tablet Take 100 mg by mouth daily.    [provider]  meclizine (ANTIVERT) 25 MG tablet Take 25 mg by mouth 3 (three) times daily as needed for dizziness or nausea.    [provider]  melatonin 1 MG TABS tablet Take 5 mg by mouth at bedtime.    [provider]  NON FORMULARY Take 1 tablet by mouth See admin instructions. PureOne multivitamin- Take 1 tablet by mouth in the morning    [provider]  omeprazole (PRILOSEC) 20 MG capsule Take 20 mg by mouth daily before breakfast.    [provider]  pramipexole (MIRAPEX) 1 MG tablet Take 1 mg by mouth at bedtime.    [provider]  Probiotic Product (PROBIOTIC & ACIDOPHILUS EX ST) CAPS Take 1 capsule by mouth in the morning. Takes 60 billion units    [provider]  risperiDONE (RISPERDAL) 1 MG tablet Take 1 mg by mouth at bedtime.    [provider]  sertraline (ZOLOFT) 100 MG tablet Take 1 tablet (100 mg total) by mouth at bedtime. 01/25/23  Ardis Hughs, NP  traZODone (DESYREL) 100 MG tablet Take 100 mg by mouth at bedtime.    [provider]  Wheat Dextrin (BENEFIBER) CHEW Chew 1-2 tablets by mouth daily as needed (for constipation).    [provider]      Allergies    Verapamil, Amlodipine, Ibuprofen, Oxycodone, Toprol xl  [metoprolol], and Lamotrigine    Review of Systems   Review of Systems  Physical Exam Updated Vital Signs BP (!) 157/92   Pulse (!) 58   Temp (!) 97.5 F (36.4 C) (Oral)   Resp 20   Ht 5\' 11"  (1.803 m)   Wt 77.1 kg   SpO2 100%   BMI 23.71 kg/m  Physical Exam Constitutional:      General: He is not in acute distress. HENT:      Head: Normocephalic and atraumatic.  Eyes:     Conjunctiva/sclera: Conjunctivae normal.     Pupils: Pupils are equal, round, and reactive to light.  Cardiovascular:     Rate and Rhythm: Normal rate and regular rhythm.  Pulmonary:     Effort: Pulmonary effort is normal. No respiratory distress.  Abdominal:     General: There is no distension.     Tenderness: There is no abdominal tenderness.  Musculoskeletal:     Comments: No spinal midline tenderness on exam, there is some mild edema of the right ankle, patient is able to passively and actively flex the right ankle and lower extremity with no significant tenderness  Skin:    General: Skin is warm and dry.  Neurological:     General: No focal deficit present.     Mental Status: He is alert. Mental status is at baseline.  Psychiatric:        Mood and Affect: Mood normal.        Behavior: Behavior normal.     ED Results / Procedures / Treatments   Labs (all labs ordered are listed, but only abnormal results are displayed) Labs Reviewed - No data to display  EKG None  Radiology CT Cervical Spine Wo Contrast  Result Date: 04/14/2023 CLINICAL DATA:  Blunt trauma EXAM: CT CERVICAL SPINE WITHOUT CONTRAST TECHNIQUE: Multidetector CT imaging of the cervical spine was performed without intravenous contrast. Multiplanar CT image reconstructions were also generated. RADIATION DOSE REDUCTION: This exam was performed according to the departmental dose-optimization program which includes automated exposure control, adjustment of the mA and/or kV according to patient size and/or use of iterative reconstruction technique. COMPARISON:  None Available. FINDINGS: Alignment: Alignment is grossly anatomic. Skull base and vertebrae: No acute fracture. No primary bone lesion or focal pathologic process. Soft tissues and spinal canal: No prevertebral fluid or swelling. No visible canal hematoma. Disc levels: Prior C3-C7 multilevel ACDF. Mild spondylosis at  C7-T1. There is moderate diffuse facet hypertrophy greatest at C2-3, C4-5, and C5-6. Upper chest: Airway is patent. Mixed solid and ground-glass nodule within the right upper lobe measures 5 mm reference image 106/4. Otherwise the lung apices are clear. Other: Reconstructed images demonstrate no additional findings. IMPRESSION: 1. No acute cervical spine fracture. 2. Extensive multilevel postsurgical and degenerative changes as above. 3. Right part-solid pulmonary nodule within the upper lobe measuring 5 mm. Per Fleischner Society Guidelines, no routine follow-up imaging is recommended. These guidelines do not apply to immunocompromised patients and patients with cancer. Follow up in patients with significant comorbidities as clinically warranted. For lung cancer screening, adhere to Lung-RADS guidelines. Reference: Radiology. 2017; 284(1):228-43. Electronically Signed  By: Sharlet Salina M.D.   On: 04/14/2023 17:32   CT Head Wo Contrast  Result Date: 04/14/2023 CLINICAL DATA:  Blunt trauma EXAM: CT HEAD WITHOUT CONTRAST TECHNIQUE: Contiguous axial images were obtained from the base of the skull through the vertex without intravenous contrast. RADIATION DOSE REDUCTION: This exam was performed according to the departmental dose-optimization program which includes automated exposure control, adjustment of the mA and/or kV according to patient size and/or use of iterative reconstruction technique. COMPARISON:  09/23/2022 FINDINGS: Brain: Evaluation limited by streak artifact related to left cochlear implant. No evidence of acute infarct or hemorrhage. Lateral ventricles and midline structures are unremarkable. No acute extra-axial fluid collections. No mass effect. Vascular: No hyperdense vessel or unexpected calcification. Skull: Normal. Negative for fracture or focal lesion. 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 Remaining paranasal sinuses are clear. Other:  None. IMPRESSION: 1. Stable exam, no acute process. Electronically Signed   By: Sharlet Salina M.D.   On: 04/14/2023 17:28   DG Ankle Complete Right  Result Date: 04/14/2023 CLINICAL DATA:  Tailbone and right ankle pain after falling. EXAM: RIGHT ANKLE - COMPLETE 3+ VIEW COMPARISON:  None Available. FINDINGS: The mineralization and alignment are normal. There is no evidence of acute fracture or dislocation. The ankle joint spaces are preserved. There are mild talonavicular degenerative changes. The soft tissues appear unremarkable. IMPRESSION: No evidence of acute fracture or dislocation. Mild talonavicular degenerative changes. Electronically Signed   By: Carey Bullocks M.D.   On: 04/14/2023 17:09   DG Sacrum/Coccyx  Result Date: 04/14/2023 CLINICAL DATA:  Tailbone and right ankle pain after falling. EXAM: SACRUM AND COCCYX - 2+ VIEW COMPARISON:  None Available. FINDINGS: The bones appear mildly demineralized. No evidence of acute fracture or diastasis of the sacroiliac joints. No significant sacroiliac arthropathy. There is lower lumbar spondylosis with disc space narrowing, endplate osteophytes and facet hypertrophy. The visualized hip joint spaces are preserved. Aortoiliac atherosclerosis noted. IMPRESSION: No evidence of acute sacral or coccygeal fracture. Lower lumbar spondylosis. Electronically Signed   By: Carey Bullocks M.D.   On: 04/14/2023 17:08    Procedures Procedures    Medications Ordered in ED Medications  meloxicam (MOBIC) tablet 7.5 mg (7.5 mg Oral Given 04/14/23 1841)  acetaminophen (TYLENOL) tablet 1,000 mg (1,000 mg Oral Given 04/14/23 1818)    ED Course/ Medical Decision Making/ A&P                                 Medical Decision Making Risk OTC drugs. Prescription drug management.   The patient is presenting with a mechanical fall likely due to chronic ongoing balance difficulties.  CT imaging and x-rays were ordered and personally viewed interpreted, with no  emergent findings.  The patient was given oral meloxicam which he takes at home, as well as Tylenol, with significant improvement of his pain, and he was able to ambulate with a walker here in the ED.  He was able to perform his basic daily functions.  At this time he is stable for discharge home, and his daughter will take him home and get him into bed.  She and other family members will be watching him more closely the next several days.  I strongly courage him to follow-up with his PCP regarding his ongoing balance difficulty issues, he may benefit from a transfer to assisted living at this point.  Final Clinical Impression(s) / ED Diagnoses Final diagnoses:  Fall, initial encounter  Sprain of right ankle, unspecified ligament, initial encounter    Rx / DC Orders ED Discharge Orders     None         Mariadelcarmen Corella, Kermit Balo, MD 04/14/23 [phone]

## 2023-04-14 NOTE — Discharge Instructions (Signed)
Please follow-up with your primary care provider about these ongoing balance problems.  Samuel Sloan may need more full-time care, including assisted living, if he is having difficulty with his balance.  Please check in on him daily for the next few days to ensure that he is doing okay in the house, and make sure he has immediate access to a helpline or cell phone.

## 2023-04-14 NOTE — ED Provider Triage Note (Signed)
Emergency Medicine Provider Triage Evaluation Note  Samuel Sloan , a 89 y.o. male  was evaluated in triage.  Pt complains of fall.  He is from a facility.  He is not on any blood thinners.  He is complaining of pain in his tailbone and right ankle.  He is unsure if he hit his head..  Review of Systems  Positive: Fall  Negative: loc  Physical Exam  BP 129/87   Pulse 66   Temp 97.8 F (36.6 C) (Oral)   Resp 17   Ht 5\' 11"  (1.803 m)   Wt 77.1 kg   SpO2 97%   BMI 23.71 kg/m  Gen:   Awake, no distress   Resp:  Normal effort  MSK:   Moves extremities without difficulty  Other:    Medical Decision Making  Medically screening exam initiated at 4:08 PM.  Appropriate orders placed.  Samuel Sloan was informed that the remainder of the evaluation will be completed by another provider, this initial triage assessment does not replace that evaluation, and the importance of remaining in the ED until their evaluation is complete.     Arthor Captain, PA-C 04/14/23 1610

## 2023-04-14 NOTE — ED Triage Notes (Addendum)
Pt BIB [facility] from [facility] d/t an unwitnessed falling his room from tripping over his walker & landing on a carpeted surface on his Lt side at 1430 today. After trying to get up from the floor he fell back down hitting his head against the floor. Denies LOC, not on thinners, no injuries from fall, A/Ox4, very HOH, 142/90, 72 bpm, CBG 144.

## 2023-05-22 ENCOUNTER — Telehealth: Payer: Self-pay | Admitting: Internal Medicine

## 2023-05-22 NOTE — Telephone Encounter (Signed)
Name: Samuel Sloan  DOB: Aug 16, 1934  MRN: [id number]  Primary Cardiologist: Maisie Fus, MD   Preoperative team, please contact this patient and set up a phone call appointment for further preoperative risk assessment. Please obtain consent and complete medication review. Thank you for your help. Last seen by Dr. Carolan Clines on 10/03/2022,no follow up is scheduled yet.   I confirm that guidance regarding antiplatelet and oral anticoagulation therapy has been completed and, if necessary, noted below.  Regarding ASA therapy, we recommend continuation of ASA throughout the perioperative period.  However, if the surgeon feels that cessation of ASA is required in the perioperative period, it may be stopped 5-7 days prior to surgery with a plan to resume it as soon as felt to be feasible from a surgical standpoint in the post-operative period.   I also confirmed the patient resides in the state of [place]. As per [facility] Medical Board telemedicine laws, the patient must reside in the state in which the provider is licensed.   Joni Reining, NP 05/22/2023, 4:13 PM [facility]

## 2023-05-22 NOTE — Telephone Encounter (Signed)
Tried to reach the pt to set up tele pre op appt. No answer.

## 2023-05-22 NOTE — Telephone Encounter (Signed)
Pre-operative Risk Assessment    Patient Name: Samuel Sloan  DOB: 26-Oct-1933 MRN: [id number]  Last visit 10/03/22 Next visit none      Request for Surgical Clearance    Procedure:   cochlear implantation  150 mins  Date of Surgery:  Clearance TBD                               Dr  Surgeon:  Dr.Matthew Dedmon Surgeon's Group or Practice Name:  [facility] Phone number:  [phone] Fax number:  [phone]   Type of Clearance Requested:   - Medical  - Pharmacy:  Hold Aspirin Did not indicate hold time   Type of Anesthesia:  General    Additional requests/questions:    Sharen Hones   05/22/2023, 3:16 PM

## 2023-05-28 ENCOUNTER — Telehealth: Payer: Self-pay

## 2023-05-28 NOTE — Telephone Encounter (Signed)
  Patient Consent for Virtual Visit        Samuel Sloan has provided verbal consent on 05/28/2023 for a virtual visit (video or telephone).   CONSENT FOR VIRTUAL VISIT FOR:  Samuel Sloan  By participating in this virtual visit I agree to the following:  I hereby voluntarily request, consent and authorize [facility] and its employed or contracted physicians, physician assistants, nurse practitioners or other licensed health care professionals (the Practitioner), to provide me with telemedicine health care services (the "Services") as deemed necessary by the treating Practitioner. I acknowledge and consent to receive the Services by the Practitioner via telemedicine. I understand that the telemedicine visit will involve communicating with the Practitioner through live audiovisual communication technology and the disclosure of certain medical information by electronic transmission. I acknowledge that I have been given the opportunity to request an in-person assessment or other available alternative prior to the telemedicine visit and am voluntarily participating in the telemedicine visit.  I understand that I have the right to withhold or withdraw my consent to the use of telemedicine in the course of my care at any time, without affecting my right to future care or treatment, and that the Practitioner or I may terminate the telemedicine visit at any time. I understand that I have the right to inspect all information obtained and/or recorded in the course of the telemedicine visit and may receive copies of available information for a reasonable fee.  I understand that some of the potential risks of receiving the Services via telemedicine include:  Delay or interruption in medical evaluation due to technological equipment failure or disruption; Information transmitted may not be sufficient (e.g. poor resolution of images) to allow for appropriate medical decision making by the  Practitioner; and/or  In rare instances, security protocols could fail, causing a breach of personal health information.  Furthermore, I acknowledge that it is my responsibility to provide information about my medical history, conditions and care that is complete and accurate to the best of my ability. I acknowledge that Practitioner's advice, recommendations, and/or decision may be based on factors not within their control, such as incomplete or inaccurate data provided by me or distortions of diagnostic images or specimens that may result from electronic transmissions. I understand that the practice of medicine is not an exact science and that Practitioner makes no warranties or guarantees regarding treatment outcomes. I acknowledge that a copy of this consent can be made available to me via my patient portal [facility] MyChart), or I can request a printed copy by calling the office of [facility].    I understand that my insurance will be billed for this visit.   I have read or had this consent read to me. I understand the contents of this consent, which adequately explains the benefits and risks of the Services being provided via telemedicine.  I have been provided ample opportunity to ask questions regarding this consent and the Services and have had my questions answered to my satisfaction. I give my informed consent for the services to be provided through the use of telemedicine in my medical care

## 2023-05-28 NOTE — Telephone Encounter (Signed)
Patient has now been scheduled for cardiac preop clearance appt, please call number provided as it is patient's daughter. Daughter POA will be with patient who is [facility]
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
## 2023-06-13 ENCOUNTER — Ambulatory Visit: Payer: Medicare HMO | Attending: Nurse Practitioner | Admitting: Nurse Practitioner

## 2023-06-13 ENCOUNTER — Encounter: Payer: Self-pay | Admitting: Nurse Practitioner

## 2023-06-13 DIAGNOSIS — Z01810 Encounter for preprocedural cardiovascular examination: Secondary | ICD-10-CM

## 2023-08-11 ENCOUNTER — Emergency Department (HOSPITAL_COMMUNITY): Payer: Medicare HMO

## 2023-08-11 ENCOUNTER — Other Ambulatory Visit: Payer: Self-pay

## 2023-08-11 ENCOUNTER — Encounter (HOSPITAL_COMMUNITY): Payer: Self-pay

## 2023-08-11 ENCOUNTER — Inpatient Hospital Stay (HOSPITAL_COMMUNITY)
Admission: EM | Admit: 2023-08-11 | Discharge: 2023-08-26 | DRG: 194 | Disposition: A | Discharge status: Home Health | Payer: Medicare HMO | Source: Skilled Nursing Facility | Attending: Internal Medicine | Admitting: Internal Medicine

## 2023-08-11 DIAGNOSIS — Z791 Long term (current) use of non-steroidal anti-inflammatories (NSAID): Secondary | ICD-10-CM

## 2023-08-11 DIAGNOSIS — F0394 Unspecified dementia, unspecified severity, with anxiety: Secondary | ICD-10-CM | POA: Diagnosis present

## 2023-08-11 DIAGNOSIS — Z888 Allergy status to other drugs, medicaments and biological substances status: Secondary | ICD-10-CM

## 2023-08-11 DIAGNOSIS — Z515 Encounter for palliative care: Secondary | ICD-10-CM

## 2023-08-11 DIAGNOSIS — K219 Gastro-esophageal reflux disease without esophagitis: Secondary | ICD-10-CM | POA: Diagnosis present

## 2023-08-11 DIAGNOSIS — Z811 Family history of alcohol abuse and dependence: Secondary | ICD-10-CM

## 2023-08-11 DIAGNOSIS — J159 Unspecified bacterial pneumonia: Principal | ICD-10-CM | POA: Diagnosis present

## 2023-08-11 DIAGNOSIS — U071 COVID-19: Secondary | ICD-10-CM | POA: Diagnosis not present

## 2023-08-11 DIAGNOSIS — Z823 Family history of stroke: Secondary | ICD-10-CM

## 2023-08-11 DIAGNOSIS — L259 Unspecified contact dermatitis, unspecified cause: Secondary | ICD-10-CM | POA: Diagnosis present

## 2023-08-11 DIAGNOSIS — Z886 Allergy status to analgesic agent status: Secondary | ICD-10-CM

## 2023-08-11 DIAGNOSIS — J189 Pneumonia, unspecified organism: Secondary | ICD-10-CM | POA: Diagnosis present

## 2023-08-11 DIAGNOSIS — I10 Essential (primary) hypertension: Secondary | ICD-10-CM | POA: Diagnosis present

## 2023-08-11 DIAGNOSIS — F32A Depression, unspecified: Secondary | ICD-10-CM | POA: Diagnosis present

## 2023-08-11 DIAGNOSIS — I951 Orthostatic hypotension: Secondary | ICD-10-CM | POA: Diagnosis present

## 2023-08-11 DIAGNOSIS — J1282 Pneumonia due to coronavirus disease 2019: Secondary | ICD-10-CM | POA: Diagnosis present

## 2023-08-11 DIAGNOSIS — E860 Dehydration: Secondary | ICD-10-CM | POA: Diagnosis not present

## 2023-08-11 DIAGNOSIS — Z79899 Other long term (current) drug therapy: Secondary | ICD-10-CM

## 2023-08-11 DIAGNOSIS — U099 Post covid-19 condition, unspecified: Secondary | ICD-10-CM | POA: Diagnosis present

## 2023-08-11 DIAGNOSIS — Z8249 Family history of ischemic heart disease and other diseases of the circulatory system: Secondary | ICD-10-CM

## 2023-08-11 DIAGNOSIS — R1312 Dysphagia, oropharyngeal phase: Secondary | ICD-10-CM | POA: Diagnosis present

## 2023-08-11 DIAGNOSIS — F0390 Unspecified dementia without behavioral disturbance: Secondary | ICD-10-CM

## 2023-08-11 DIAGNOSIS — Z885 Allergy status to narcotic agent status: Secondary | ICD-10-CM

## 2023-08-11 DIAGNOSIS — N400 Enlarged prostate without lower urinary tract symptoms: Secondary | ICD-10-CM | POA: Diagnosis present

## 2023-08-11 DIAGNOSIS — F411 Generalized anxiety disorder: Secondary | ICD-10-CM | POA: Diagnosis present

## 2023-08-11 DIAGNOSIS — R7989 Other specified abnormal findings of blood chemistry: Secondary | ICD-10-CM

## 2023-08-11 DIAGNOSIS — Z751 Person awaiting admission to adequate facility elsewhere: Secondary | ICD-10-CM

## 2023-08-11 DIAGNOSIS — F05 Delirium due to known physiological condition: Secondary | ICD-10-CM | POA: Diagnosis not present

## 2023-08-11 DIAGNOSIS — S32059A Unspecified fracture of fifth lumbar vertebra, initial encounter for closed fracture: Secondary | ICD-10-CM

## 2023-08-11 DIAGNOSIS — H9190 Unspecified hearing loss, unspecified ear: Secondary | ICD-10-CM | POA: Diagnosis present

## 2023-08-11 DIAGNOSIS — Z7189 Other specified counseling: Secondary | ICD-10-CM

## 2023-08-11 DIAGNOSIS — F0393 Unspecified dementia, unspecified severity, with mood disturbance: Secondary | ICD-10-CM | POA: Diagnosis present

## 2023-08-11 DIAGNOSIS — G2581 Restless legs syndrome: Secondary | ICD-10-CM | POA: Diagnosis present

## 2023-08-11 DIAGNOSIS — Z66 Do not resuscitate: Secondary | ICD-10-CM | POA: Diagnosis present

## 2023-08-11 DIAGNOSIS — Z86711 Personal history of pulmonary embolism: Secondary | ICD-10-CM

## 2023-08-11 DIAGNOSIS — Z7982 Long term (current) use of aspirin: Secondary | ICD-10-CM

## 2023-08-11 LAB — COMPREHENSIVE METABOLIC PANEL
ALT: 42 U/L (ref 0–44)
AST: 37 U/L (ref 15–41)
Albumin: 4 g/dL (ref 3.5–5.0)
Alkaline Phosphatase: 65 U/L (ref 38–126)
Anion gap: 13 (ref 5–15)
BUN: 18 mg/dL (ref 8–23)
CO2: 25 mmol/L (ref 22–32)
Calcium: 10.7 mg/dL — ABNORMAL HIGH (ref 8.9–10.3)
Chloride: 102 mmol/L (ref 98–111)
Creatinine, Ser: 0.96 mg/dL (ref 0.61–1.24)
GFR, Estimated: >60 mL/min (ref >60)
Glucose, Bld: 96 mg/dL (ref 70–99)
Potassium: 3.2 mmol/L — ABNORMAL LOW (ref 3.5–5.1)
Sodium: 140 mmol/L (ref 135–145)
Total Bilirubin: 1.1 mg/dL (ref <1.2)
Total Protein: 6.6 g/dL (ref 6.5–8.1)

## 2023-08-11 LAB — CBC
HCT: 44.5 % (ref 39.0–52.0)
Hemoglobin: 15.3 g/dL (ref 13.0–17.0)
MCH: 32.6 pg (ref 26.0–34.0)
MCHC: 34.4 g/dL (ref 30.0–36.0)
MCV: 94.9 fL (ref 80.0–100.0)
Platelets: 188 10*3/uL (ref 150–400)
RBC: 4.69 MIL/uL (ref 4.22–5.81)
RDW: 13.1 % (ref 11.5–15.5)
WBC: 8.6 10*3/uL (ref 4.0–10.5)
nRBC: 0 % (ref 0.0–0.2)

## 2023-08-11 LAB — URINALYSIS, ROUTINE W REFLEX MICROSCOPIC
Bilirubin Urine: NEGATIVE
Glucose, UA: NEGATIVE mg/dL
Hgb urine dipstick: NEGATIVE
Ketones, ur: NEGATIVE mg/dL
Leukocytes,Ua: NEGATIVE
Nitrite: NEGATIVE
Protein, ur: NEGATIVE mg/dL
Specific Gravity, Urine: 1.043 — ABNORMAL HIGH (ref 1.005–1.030)
pH: 8 (ref 5.0–8.0)

## 2023-08-11 LAB — RESP PANEL BY RT-PCR (RSV, FLU A&B, COVID)  RVPGX2
Influenza A by PCR: NEGATIVE
Influenza B by PCR: NEGATIVE
Resp Syncytial Virus by PCR: NEGATIVE
SARS Coronavirus 2 by RT PCR: POSITIVE — AB

## 2023-08-11 LAB — I-STAT CG4 LACTIC ACID, ED: Lactic Acid, Venous: 1.2 mmol/L (ref 0.5–1.9)

## 2023-08-11 LAB — LIPASE, BLOOD: Lipase: 29 U/L (ref 11–51)

## 2023-08-11 LAB — TROPONIN I (HIGH SENSITIVITY)
Troponin I (High Sensitivity): 34 ng/L — ABNORMAL HIGH (ref <18)
Troponin I (High Sensitivity): 66 ng/L — ABNORMAL HIGH (ref <18)

## 2023-08-11 MED ORDER — BENZONATATE 100 MG PO CAPS: 100.0000 mg | ORAL_CAPSULE | Freq: Three times a day (TID) | ORAL | Status: DC | PRN

## 2023-08-11 MED ORDER — MELATONIN 5 MG PO TABS
5.0000 mg | ORAL_TABLET | Freq: Every day | ORAL | Status: DC
  Administered 2023-08-11 – 2023-08-25 (×15): 5 mg via ORAL
  Filled 2023-08-11 (×15): qty 1

## 2023-08-11 MED ORDER — TRAZODONE HCL 50 MG PO TABS: 25.0000 mg | ORAL_TABLET | Freq: Every evening | ORAL | Status: DC | PRN

## 2023-08-11 MED ORDER — ENOXAPARIN SODIUM 40 MG/0.4ML IJ SOSY
40.0000 mg | PREFILLED_SYRINGE | INTRAMUSCULAR | Status: DC
  Administered 2023-08-11 – 2023-08-25 (×15): 40 mg via SUBCUTANEOUS
  Filled 2023-08-11 (×15): qty 0.4

## 2023-08-11 MED ORDER — SODIUM CHLORIDE 0.9 % IV SOLN
500.0000 mg | Freq: Once | INTRAVENOUS | Status: AC
  Administered 2023-08-11: 500 mg via INTRAVENOUS
  Filled 2023-08-11: qty 5

## 2023-08-11 MED ORDER — BUPROPION HCL ER (XL) 300 MG PO TB24
300.0000 mg | ORAL_TABLET | Freq: Every day | ORAL | Status: DC
  Administered 2023-08-11 – 2023-08-16 (×6): 300 mg via ORAL
  Filled 2023-08-11: qty 2
  Filled 2023-08-11 (×5): qty 1

## 2023-08-11 MED ORDER — MENTHOL 3 MG MT LOZG: 1.0000 | LOZENGE | OROMUCOSAL | Status: DC | PRN

## 2023-08-11 MED ORDER — LOSARTAN POTASSIUM 50 MG PO TABS
100.0000 mg | ORAL_TABLET | Freq: Every day | ORAL | Status: DC
  Administered 2023-08-11 – 2023-08-16 (×6): 100 mg via ORAL
  Filled 2023-08-11 (×6): qty 2
  Filled 2023-08-11: qty 4

## 2023-08-11 MED ORDER — GUAIFENESIN 100 MG/5ML PO LIQD: 5.0000 mL | ORAL | Status: DC | PRN

## 2023-08-11 MED ORDER — ONDANSETRON HCL 4 MG PO TABS: 4.0000 mg | ORAL_TABLET | Freq: Four times a day (QID) | ORAL | Status: DC | PRN

## 2023-08-11 MED ORDER — PANCRELIPASE (LIP-PROT-AMYL) 36000-114000 UNITS PO CPEP
72000.0000 [IU] | ORAL_CAPSULE | Freq: Three times a day (TID) | ORAL | Status: DC
  Administered 2023-08-11 – 2023-08-25 (×37): 72000 [IU] via ORAL
  Filled 2023-08-11 (×3): qty 6
  Filled 2023-08-11 (×3): qty 2
  Filled 2023-08-11: qty 6
  Filled 2023-08-11: qty 2
  Filled 2023-08-11 (×5): qty 6
  Filled 2023-08-11: qty 2
  Filled 2023-08-11: qty 6
  Filled 2023-08-11 (×2): qty 2
  Filled 2023-08-11: qty 6
  Filled 2023-08-11: qty 2
  Filled 2023-08-11 (×4): qty 6
  Filled 2023-08-11: qty 2
  Filled 2023-08-11: qty 6
  Filled 2023-08-11 (×2): qty 2
  Filled 2023-08-11: qty 6
  Filled 2023-08-11: qty 2
  Filled 2023-08-11: qty 6
  Filled 2023-08-11 (×2): qty 2
  Filled 2023-08-11: qty 6
  Filled 2023-08-11: qty 2
  Filled 2023-08-11: qty 6
  Filled 2023-08-11 (×2): qty 2
  Filled 2023-08-11: qty 6

## 2023-08-11 MED ORDER — FINASTERIDE 5 MG PO TABS
5.0000 mg | ORAL_TABLET | Freq: Every day | ORAL | Status: DC
  Administered 2023-08-11 – 2023-08-26 (×16): 5 mg via ORAL
  Filled 2023-08-11 (×16): qty 1

## 2023-08-11 MED ORDER — LOPERAMIDE HCL 2 MG PO CAPS: 2.0000 mg | ORAL_CAPSULE | Freq: Four times a day (QID) | ORAL | Status: DC | PRN

## 2023-08-11 MED ORDER — ONDANSETRON HCL 4 MG/2ML IJ SOLN: 4.0000 mg | Freq: Four times a day (QID) | INTRAMUSCULAR | Status: DC | PRN

## 2023-08-11 MED ORDER — RISPERIDONE 0.25 MG PO TABS
0.5000 mg | ORAL_TABLET | Freq: Every day | ORAL | Status: DC
  Administered 2023-08-11 – 2023-08-25 (×15): 0.5 mg via ORAL
  Filled 2023-08-11 (×4): qty 2
  Filled 2023-08-11: qty 1
  Filled 2023-08-11 (×10): qty 2

## 2023-08-11 MED ORDER — RISAQUAD PO CAPS
1.0000 | ORAL_CAPSULE | Freq: Every day | ORAL | Status: DC
  Administered 2023-08-12 – 2023-08-26 (×15): 1 via ORAL
  Filled 2023-08-11 (×15): qty 1

## 2023-08-11 MED ORDER — PANTOPRAZOLE SODIUM 40 MG PO TBEC
40.0000 mg | DELAYED_RELEASE_TABLET | Freq: Every day | ORAL | Status: DC
  Administered 2023-08-11 – 2023-08-26 (×16): 40 mg via ORAL
  Filled 2023-08-11 (×5): qty 1
  Filled 2023-08-11: qty 2
  Filled 2023-08-11 (×10): qty 1

## 2023-08-11 MED ORDER — SODIUM CHLORIDE 0.9 % IV SOLN
1.0000 g | Freq: Once | INTRAVENOUS | Status: AC
Start: 2023-08-11 — End: 2023-08-11
  Administered 2023-08-11: 1 g via INTRAVENOUS
  Filled 2023-08-11: qty 10

## 2023-08-11 MED ORDER — ONDANSETRON HCL 4 MG/2ML IJ SOLN
4.0000 mg | Freq: Once | INTRAMUSCULAR | Status: AC
  Administered 2023-08-11: 4 mg via INTRAVENOUS
  Filled 2023-08-11: qty 2

## 2023-08-11 MED ORDER — SODIUM CHLORIDE 0.9 % IV SOLN
1.0000 g | INTRAVENOUS | Status: DC
  Administered 2023-08-12 – 2023-08-17 (×6): 1 g via INTRAVENOUS
  Filled 2023-08-11 (×6): qty 10

## 2023-08-11 MED ORDER — ASPIRIN 81 MG PO TBEC
81.0000 mg | DELAYED_RELEASE_TABLET | Freq: Every morning | ORAL | Status: DC
  Administered 2023-08-11 – 2023-08-26 (×16): 81 mg via ORAL
  Filled 2023-08-11 (×15): qty 1

## 2023-08-11 MED ORDER — PHENOL 1.4 % MT LIQD
1.0000 | OROMUCOSAL | Status: DC | PRN
  Administered 2023-08-15: 1 via OROMUCOSAL
  Filled 2023-08-11: qty 177

## 2023-08-11 MED ORDER — ACETAMINOPHEN 500 MG PO TABS
1000.0000 mg | ORAL_TABLET | Freq: Two times a day (BID) | ORAL | Status: DC
  Administered 2023-08-11 – 2023-08-26 (×31): 1000 mg via ORAL
  Filled 2023-08-11 (×31): qty 2

## 2023-08-11 MED ORDER — ACETAMINOPHEN 325 MG PO TABS: 650.0000 mg | ORAL_TABLET | Freq: Four times a day (QID) | ORAL | Status: DC | PRN

## 2023-08-11 MED ORDER — CLONAZEPAM 0.5 MG PO TABS
0.5000 mg | ORAL_TABLET | Freq: Two times a day (BID) | ORAL | Status: DC
  Administered 2023-08-11 – 2023-08-26 (×31): 0.5 mg via ORAL
  Filled 2023-08-11 (×31): qty 1

## 2023-08-11 MED ORDER — ALBUTEROL SULFATE (2.5 MG/3ML) 0.083% IN NEBU: 2.5000 mg | INHALATION_SOLUTION | RESPIRATORY_TRACT | Status: DC | PRN

## 2023-08-11 MED ORDER — TAMSULOSIN HCL 0.4 MG PO CAPS
0.4000 mg | ORAL_CAPSULE | Freq: Every day | ORAL | Status: DC
  Administered 2023-08-11 – 2023-08-26 (×15): 0.4 mg via ORAL
  Filled 2023-08-11 (×16): qty 1

## 2023-08-11 MED ORDER — LACTATED RINGERS IV BOLUS
1000.0000 mL | Freq: Once | INTRAVENOUS | Status: AC
  Administered 2023-08-11: 1000 mL via INTRAVENOUS

## 2023-08-11 MED ORDER — TRAZODONE HCL 100 MG PO TABS
100.0000 mg | ORAL_TABLET | Freq: Every day | ORAL | Status: DC
  Administered 2023-08-11 – 2023-08-25 (×15): 100 mg via ORAL
  Filled 2023-08-11 (×14): qty 1
  Filled 2023-08-11: qty 2

## 2023-08-11 MED ORDER — SODIUM CHLORIDE 0.9 % IV SOLN
500.0000 mg | INTRAVENOUS | Status: DC
  Administered 2023-08-12: 500 mg via INTRAVENOUS
  Filled 2023-08-11 (×2): qty 5

## 2023-08-11 MED ORDER — HYDROCHLOROTHIAZIDE 12.5 MG PO TABS
12.5000 mg | ORAL_TABLET | Freq: Every day | ORAL | Status: DC
  Administered 2023-08-11 – 2023-08-16 (×6): 12.5 mg via ORAL
  Filled 2023-08-11 (×6): qty 1

## 2023-08-11 MED ORDER — BISMUTH SUBSALICYLATE 262 MG/15ML PO SUSP: 30.0000 mL | Freq: Four times a day (QID) | ORAL | Status: DC | PRN

## 2023-08-11 MED ORDER — ACETAMINOPHEN 650 MG RE SUPP: 650.0000 mg | Freq: Four times a day (QID) | RECTAL | Status: DC | PRN

## 2023-08-11 MED ORDER — PRAMIPEXOLE DIHYDROCHLORIDE 0.25 MG PO TABS
1.0000 mg | ORAL_TABLET | Freq: Every day | ORAL | Status: DC
  Administered 2023-08-11 – 2023-08-25 (×15): 1 mg via ORAL
  Filled 2023-08-11 (×4): qty 1
  Filled 2023-08-11 (×3): qty 4
  Filled 2023-08-11: qty 1
  Filled 2023-08-11 (×5): qty 4
  Filled 2023-08-11: qty 1
  Filled 2023-08-11: qty 4

## 2023-08-11 MED ORDER — IOHEXOL 300 MG/ML  SOLN
100.0000 mL | Freq: Once | INTRAMUSCULAR | Status: AC | PRN
  Administered 2023-08-11: 100 mL via INTRAVENOUS

## 2023-08-11 MED ORDER — POTASSIUM CHLORIDE CRYS ER 20 MEQ PO TBCR
60.0000 meq | EXTENDED_RELEASE_TABLET | Freq: Once | ORAL | Status: AC
  Administered 2023-08-11: 60 meq via ORAL
  Filled 2023-08-11: qty 3

## 2023-08-11 NOTE — ED Provider Notes (Signed)
[place] EMERGENCY DEPARTMENT AT [facility] Provider Note   CSN: [id number] Arrival date & time: 08/11/23  0957     History  Chief Complaint  Patient presents with   Dehydration    Samuel Sloan is a 89 y.o. male.  HPI   89 year old male with medical history significant for dementia, BPH, diverticulitis, GERD, HTN, PE not on anticoagulation, restless leg syndrome, pseudoseizures, CKD who presents to the emergency department with concern for overall decline.  The patient had an episode of reported pseudoseizure like activity earlier this morning that staff at the facility told the patient's daughter were more severe than normal.  No falls or trauma.  The patient also had an episode of nausea and vomiting reportedly per EMS.  The patient arrives confused, unclear baseline for comparison with no family members bedside, limited in his ability to provide a history of present illness due to his mental status.  Per daughter, Samuel Sloan who is his POA: Resides at assisted living facility, needs escalation of care. Recent admission at [facility] in Nov, recently positive for Covid on Dec 12th. Had a cough for 5 days per PCP notes on 12/12. Recovered from Covid, but now having increasing difficulty with ADLs. Has had home health nursing to help, but it has not been adequate. No longer caring for himself. Is DNR. Daughter caught COVID after him, but staff at facility state he hasn't been eating much. Since last Monday, he left COVID seclusion at his facility, had been improving some but has overall declined in his ability to care for self. Had a reported temp of 88 degrees and had an episode of shaking this morning. Does have a hx of pseudoseizures. Reported "more extreme and different than his normal pseudoseizures." Had an episode of nausea and vomiting this morning as well.  Home Medications Prior to Admission medications   Medication Sig Start Date End Date Taking? Authorizing Provider   acetaminophen (TYLENOL) 650 MG CR tablet Take 1,300 mg by mouth in the morning and at bedtime.    [provider]  aspirin EC 81 MG tablet Take 81 mg by mouth in the morning.    [provider]  bismuth subsalicylate (PEPTO BISMOL) 262 MG/15ML suspension Take 30 mLs by mouth every 6 (six) hours as needed for indigestion.    [provider]  buPROPion (WELLBUTRIN XL) 300 MG 24 hr tablet Take 300 mg by mouth daily. 04/11/23   [provider]  CHLORASEPTIC MOUTH PAIN 1.4 % LIQD Use as directed 1 spray in the mouth or throat as needed for throat irritation / pain (related to post-nasal drip).    [provider]  clonazePAM (KLONOPIN) 0.5 MG tablet Take 0.5 mg by mouth 2 (two) times daily.    [provider]  diclofenac Sodium (VOLTAREN) 1 % GEL Apply 2-4 g topically 4 (four) times daily as needed (pain).    [provider]  hydrochlorothiazide (HYDRODIURIL) 12.5 MG tablet Take 1 tablet by mouth daily. 03/19/23   [provider]  hydroxypropyl methylcellulose / hypromellose (ISOPTO TEARS / GONIOVISC) 2.5 % ophthalmic solution 2 drops 4 (four) times daily as needed for dry eyes. Patient not taking: Reported on 05/28/2023    [provider]  lipase/protease/amylase (CREON) 36000 UNITS CPEP capsule Take 72,000 Units by mouth 3 (three) times daily before meals. Take 36,000 units (1 cap) with snacks as needed    [provider]  loperamide (IMODIUM A-D) 2 MG tablet Take 2 mg by mouth  4 (four) times daily as needed for diarrhea or loose stools.    [provider]  losartan (COZAAR) 100 MG tablet Take 100 mg by mouth daily.    [provider]  meclizine (ANTIVERT) 25 MG tablet Take 25 mg by mouth 3 (three) times daily as needed for dizziness or nausea.    [provider]  melatonin 1 MG TABS tablet Take 5 mg by mouth at bedtime.    [provider]  meloxicam (MOBIC) 7.5 MG tablet Take 7.5  mg by mouth in the morning and at bedtime.    [provider]  NON FORMULARY Take 1 tablet by mouth See admin instructions. PureOne multivitamin- Take 1 tablet by mouth in the morning    [provider]  omeprazole (PRILOSEC) 20 MG capsule Take 20 mg by mouth daily before breakfast.    [provider]  pramipexole (MIRAPEX) 1 MG tablet Take 1 mg by mouth at bedtime.    [provider]  Probiotic Product (PROBIOTIC & ACIDOPHILUS EX ST) CAPS Take 1 capsule by mouth in the morning. Takes 60 billion units    [provider]  risperiDONE (RISPERDAL) 1 MG tablet Take 0.5 mg by mouth at bedtime.    [provider]  traZODone (DESYREL) 100 MG tablet Take 100 mg by mouth at bedtime.    [provider]  Wheat Dextrin (BENEFIBER) CHEW Chew 1-2 tablets by mouth daily as needed (for constipation).    [provider]      Allergies    Verapamil, Amlodipine, Ibuprofen, Oxycodone, Toprol xl  [metoprolol], and Lamotrigine    Review of Systems   Review of Systems  Unable to perform ROS: Dementia    Physical Exam Updated Vital Signs BP 139/81   Pulse (!) 104   Temp 98.4 F (36.9 C) (Oral)   Resp 18   Ht 5\' 11"  (1.803 m)   Wt 78 kg   SpO2 96%   BMI 23.98 kg/m  Physical Exam Vitals and nursing note reviewed.  Constitutional:      General: He is not in acute distress.    Appearance: He is well-developed.  HENT:     Head: Normocephalic and atraumatic.     Mouth/Throat:     Mouth: Mucous membranes are dry.  Eyes:     Conjunctiva/sclera: Conjunctivae normal.  Cardiovascular:     Rate and Rhythm: Regular rhythm. Tachycardia present.  Pulmonary:     Effort: Pulmonary effort is normal. No respiratory distress.     Breath sounds: Normal breath sounds.  Abdominal:     Palpations: Abdomen is soft.     Tenderness: There is no abdominal tenderness.  Musculoskeletal:        General: No swelling.     Cervical back: Neck supple.   Skin:    General: Skin is warm and dry.     Capillary Refill: Capillary refill takes less than 2 seconds.  Neurological:     Mental Status: He is alert.     Comments: GCS 14, no clear CN deficit, moving all four extremities with intact strength and sensation.  Psychiatric:        Mood and Affect: Mood normal.     ED Results / Procedures / Treatments   Labs (all labs ordered are listed, but only abnormal results are displayed) Labs Reviewed  RESP PANEL BY RT-PCR (RSV, FLU A&B, COVID)  [id number] - Abnormal; Notable for the following components:      Result Value  SARS Coronavirus 2 by RT PCR POSITIVE (*)    All other components within normal limits  COMPREHENSIVE METABOLIC PANEL - Abnormal; Notable for the following components:   Potassium 3.2 (*)    Calcium 10.7 (*)    All other components within normal limits  URINALYSIS, ROUTINE W REFLEX MICROSCOPIC - Abnormal; Notable for the following components:   Specific Gravity, Urine 1.043 (*)    All other components within normal limits  TROPONIN I (HIGH SENSITIVITY) - Abnormal; Notable for the following components:   Troponin I (High Sensitivity) 34 (*)    All other components within normal limits  LIPASE, BLOOD  CBC  I-STAT CG4 LACTIC ACID, ED  TROPONIN I (HIGH SENSITIVITY)    EKG EKG Interpretation Date/Time:  Monday August 11 2023 10:38:35 EST Ventricular Rate:  102 PR Interval:  173 QRS Duration:  86 QT Interval:  321 QTC Calculation: 419 R Axis:   75  Text Interpretation: Sinus tachycardia Anterior infarct, old Confirmed by Ernie Avena ([id number]) on 08/11/2023 11:47:17 AM  Radiology CT HEAD WO CONTRAST ( ) Result Date: 08/11/2023 CLINICAL DATA:  Mental status change, unknown cause EXAM: CT HEAD WITHOUT CONTRAST TECHNIQUE: Contiguous axial images were obtained from the base of the skull through the vertex without intravenous contrast. RADIATION DOSE REDUCTION: This exam was performed according to the departmental  dose-optimization program which includes automated exposure control, adjustment of the mA and/or kV according to patient size and/or use of iterative reconstruction technique. COMPARISON:  None Available. FINDINGS: Brain: No evidence of acute infarction, hemorrhage, hydrocephalus, extra-axial collection or mass lesion/mass effect. Cerebral atrophy. Vascular: No hyperdense vessel. Skull: No acute fracture. Sinuses/Orbits: Clear sinuses.  No acute orbital findings. Other: Left cochlear implant. IMPRESSION: No evidence of acute intracranial abnormality. Electronically Signed   By: Feliberto Harts M.D.   On: 08/11/2023 13:18   CT ABDOMEN PELVIS W CONTRAST Result Date: 08/11/2023 CLINICAL DATA:  Nausea and vomiting. EXAM: CT ABDOMEN AND PELVIS WITH CONTRAST TECHNIQUE: Multidetector CT imaging of the abdomen and pelvis was performed using the standard protocol following bolus administration of intravenous contrast. RADIATION DOSE REDUCTION: This exam was performed according to the departmental dose-optimization program which includes automated exposure control, adjustment of the mA and/or kV according to patient size and/or use of iterative reconstruction technique. CONTRAST:  OMNIPAQUE IOHEXOL 300 MG/ML  SOLN COMPARISON:  Same day chest radiograph, radiograph of the sacrum dated 04/14/2023 FINDINGS: Lower chest: Left lower lobe predominant patchy ground-glass and interstitial opacities. No pleural effusion or pneumothorax demonstrated. Partially imaged heart size is normal. Hepatobiliary: Subcentimeter segment 4/8 hypodensity (9:16), too small to characterize. No intra or extrahepatic biliary ductal dilation. Cholecystectomy. Pancreas: No focal lesions or main ductal dilation. Spleen: Hypodensity near the splenic hilum (9:16), too small to characterize. Scattered calcified granulomata. Normal size. Adrenals/Urinary Tract: No adrenal nodules. No hydronephrosis. Nonobstructing right lower pole stones measuring  up to 6 mm. Focus of cortical atrophy adjacent to the stones in the lower pole right kidney. No suspicious renal masses. No focal bladder wall thickening. Stomach/Bowel: Normal appearance of the stomach. No evidence of bowel wall thickening, distention, or inflammatory changes. Colonic diverticulosis without acute diverticulitis. Rounded radiodensity in the rectum may reflect ingested material. Appendix is not discretely seen. Vascular/Lymphatic: Aortic atherosclerosis. 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 No enlarged abdominal or pelvic lymph nodes. Reproductive: Enlargement of the prostate with median lobe hypertrophy. Other: No free fluid, fluid collection, or free air. Musculoskeletal: New fracture involving  the anterior superior endplate of L5 with associated compression deformity of L5. Additional multilevel degenerative changes of the lumbar spine. Multilevel degenerative changes of the partially imaged thoracic and lumbar spine. Small fat-containing right inguinal hernia. IMPRESSION: 1. New fracture involving the anterior superior endplate of L5 with associated compression deformity of L5 compared to 04/14/2023. 2. Left lower lobe predominant patchy ground-glass and interstitial opacities, in keeping with COVID-19 infection. 3. Nonobstructing right lower pole renal stones measuring up to 6 mm. 4. Colonic diverticulosis without acute diverticulitis. 5. Prostatomegaly. 6. Aortic Atherosclerosis (ICD10-I70.0). Electronically Signed   By: Agustin Cree M.D.   On: 08/11/2023 13:00   DG Chest Portable 1 View Result Date: 08/11/2023 CLINICAL DATA:  Cough vomiting. EXAM: PORTABLE CHEST 1 VIEW COMPARISON:  07/31/2022. FINDINGS: Trachea is midline. Heart size stable. Left perihilar and left lower lobe airspace opacification. There may be minimal involvement of the right lung base. No pleural fluid. Probable enchondroma or bone infarct in the proximal right humerus. IMPRESSION: Left  perihilar and left lower lobe airspace opacification, likely due to pneumonia. Given the history of vomiting, aspiration is also considered. There may be early/mild involvement of the right lung base. Followup PA and lateral chest X-ray is recommended in 3-4 weeks following trial of antibiotic therapy to ensure resolution and exclude underlying malignancy. Electronically Signed   By: Leanna Battles M.D.   On: 08/11/2023 10:57    Procedures Procedures    Medications Ordered in ED Medications  tamsulosin (FLOMAX) capsule 0.4 mg (0.4 mg Oral Given 08/11/23 1349)  finasteride (PROSCAR) tablet 5 mg (5 mg Oral Given 08/11/23 1349)  aspirin EC tablet 81 mg (81 mg Oral Given 08/11/23 1353)  acetaminophen (TYLENOL) tablet 1,000 mg (has no administration in time range)  buPROPion (WELLBUTRIN XL) 24 hr tablet 300 mg (300 mg Oral Given 08/11/23 1354)  clonazePAM (KLONOPIN) tablet 0.5 mg (0.5 mg Oral Given 08/11/23 1353)  hydrochlorothiazide (HYDRODIURIL) tablet 12.5 mg (12.5 mg Oral Given 08/11/23 1353)  lipase/protease/amylase (CREON) capsule 72,000 Units (has no administration in time range)  loperamide (IMODIUM) capsule 2 mg (has no administration in time range)  bismuth subsalicylate (PEPTO BISMOL) 262 MG/15ML suspension 30 mL (has no administration in time range)  phenol (CHLORASEPTIC) mouth spray 1 spray (has no administration in time range)  losartan (COZAAR) tablet 100 mg (100 mg Oral Given 08/11/23 1351)  melatonin tablet 5 mg (has no administration in time range)  pantoprazole (PROTONIX) EC tablet 40 mg (40 mg Oral Given 08/11/23 1352)  pramipexole (MIRAPEX) tablet 1 mg (has no administration in time range)  Probiotic & Acidophilus Ex St CAPS 1 capsule (has no administration in time range)  risperiDONE (RISPERDAL) tablet 0.5 mg (has no administration in time range)  traZODone (DESYREL) tablet 100 mg (has no administration in time range)  azithromycin (ZITHROMAX) 500 mg in sodium chloride  0.9 % 250 mL IVPB (has no administration in time range)  potassium chloride SA (KLOR-CON M) CR tablet 60 mEq (has no administration in time range)  enoxaparin (LOVENOX) injection 40 mg (has no administration in time range)  acetaminophen (TYLENOL) tablet 650 mg (has no administration in time range)    Or  acetaminophen (TYLENOL) suppository 650 mg (has no administration in time range)  traZODone (DESYREL) tablet 25 mg (has no administration in time range)  ondansetron (ZOFRAN) tablet 4 mg (has no administration in time range)    Or  ondansetron (ZOFRAN) injection 4 mg (has no administration in time range)  albuterol (PROVENTIL) (2.5  MG/3ML) 0.083% nebulizer solution 2.5 mg (has no administration in time range)  azithromycin (ZITHROMAX) 500 mg in sodium chloride 0.9 % 250 mL IVPB (has no administration in time range)  cefTRIAXone (ROCEPHIN) 1 g in sodium chloride 0.9 % 100 mL IVPB (has no administration in time range)  lactated ringers bolus 1,000 mL (1,000 mLs Intravenous New Bag/Given 08/11/23 1202)  ondansetron (ZOFRAN) injection 4 mg (4 mg Intravenous Given 08/11/23 1203)  iohexol (OMNIPAQUE) 300 MG/ML solution 100 mL (100 mLs Intravenous Contrast Given 08/11/23 1212)  cefTRIAXone (ROCEPHIN) 1 g in sodium chloride 0.9 % 100 mL IVPB (1 g Intravenous New Bag/Given 08/11/23 1346)    ED Course/ Medical Decision Making/ A&P Clinical Course as of 08/11/23 1457  Mon Aug 11, 2023  1142 SARS Coronavirus 2 by RT PCR(!): POSITIVE [JL]  1147 Troponin I (High Sensitivity)(!): 34 [JL]    Clinical Course User Index [JL] Ernie Avena, MD                                 Medical Decision Making Amount and/or Complexity of Data Reviewed Labs: ordered. Decision-making details documented in ED Course. Radiology: ordered.  Risk OTC drugs. Prescription drug management. Decision regarding hospitalization.    89 year old male with medical history significant for dementia, BPH, diverticulitis,  GERD, HTN, PE not on anticoagulation, restless leg syndrome, pseudoseizures, CKD who presents to the emergency department with concern for overall decline.  The patient had an episode of reported pseudoseizure like activity earlier this morning that staff at the facility told the patient's daughter were more severe than normal.  No falls or trauma.  The patient also had an episode of nausea and vomiting reportedly per EMS.  The patient arrives confused, unclear baseline for comparison with no family members bedside, limited in his ability to provide a history of present illness due to his mental status.  Per daughter, Samuel Sloan who is his POA: Resides at assisted living facility, needs escalation of care. Recent admission at [facility] in Nov, recently positive for Covid on Dec 12th. Had a cough for 5 days per PCP notes on 12/12. Recovered from Covid, but now having increasing difficulty with ADLs. Has had home health nursing to help, but it has not been adequate. No longer caring for himself. Is DNR. Daughter caught COVID after him, but staff at facility state he hasn't been eating much. Since last Monday, he left COVID seclusion at his facility, had been improving some but has overall declined in his ability to care for self. Had a reported temp of 88 degrees and had an episode of shaking this morning. Does have a hx of pseudoseizures. Reported "more extreme and different than his normal pseudoseizures." Had an episode of nausea and vomiting this morning as well.  On arrival, the patient was afebrile, mildly tachycardic heart rate 104, BP 139/81, saturating 96% on room air.  Sinus tachycardia noted on cardiac telemetry.  Patient presenting with cognitive decline and decline in his ADLs.  Recently positive for COVID but persistently symptomatic 2 weeks in.  Will obtain chest x-ray imaging to further evaluate for potential developing bacterial pneumonia, further workup of the patient's change in mental status.  Additionally  with the nausea and vomiting, will further evaluate with cardiac troponins, CT of the abdomen pelvis.  EKG: Sinus tachycardia, ventricular rate 102, no acute ischemic changes.  CXR:  IMPRESSION:  Left perihilar and left lower lobe airspace opacification, likely  due to pneumonia. Given the history of vomiting, aspiration is also  considered. There may be early/mild involvement of the right lung  base. Followup PA and lateral chest X-ray is recommended in 3-4  weeks following trial of antibiotic therapy to ensure resolution and  exclude underlying malignancy.    Patient covered with broad-spectrum antibiotics to include IV Rocephin and azithromycin.  He was fluid resuscitated with a 1 L LR bolus and administered Zofran.    CT Head: IMPRESSION:  No evidence of acute intracranial abnormality.      CT Abdomen Pelvis: IMPRESSION:  1. New fracture involving the anterior superior endplate of L5 with  associated compression deformity of L5 compared to 04/14/2023.  2. Left lower lobe predominant patchy ground-glass and interstitial  opacities, in keeping with COVID-19 infection.  3. Nonobstructing right lower pole renal stones measuring up to 6  mm.  4. Colonic diverticulosis without acute diverticulitis.  5. Prostatomegaly.  6. Aortic Atherosclerosis (ICD10-I70.0).   Repeat troponin pending however patient not complaining of active chest pain.  Considered myocarditis in the setting of COVID-19 versus developing bacterial pneumonia.  Patient with fracture of L5 seen incidentally on CT of unclear chronicity.  Due to concern for dehydration, developing bacterial pneumonia, further decline in ADLs, hospitalist medicine was consulted for admission, patient could benefit from PT/OT consult inpatient and consideration for SNF placement.   Final Clinical Impression(s) / ED Diagnoses Final diagnoses:  Dehydration  COVID-19  Dementia, unspecified dementia severity, unspecified dementia type,  unspecified whether behavioral, psychotic, or mood disturbance or anxiety (HCC)  Elevated troponin  Closed fracture of fifth lumbar vertebra, unspecified fracture morphology, initial encounter [facility])    Rx / DC Orders ED Discharge Orders     None         Ernie Avena, MD 08/11/23 1457

## 2023-08-11 NOTE — ED Triage Notes (Addendum)
Patient BIB [facility] from [place]. Called out for dehydration. Patient vomited on the floor when EMS arrived. Denies pain. Has a cough. Alert to self and place and stated he is here because "I dont feel good."

## 2023-08-11 NOTE — ED Notes (Signed)
Patient transported to CT 
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
## 2023-08-11 NOTE — ED Provider Triage Note (Signed)
Emergency Medicine Provider Triage Evaluation Note  Samuel Sloan , a 89 y.o. male  was evaluated in triage.  Pt complains of nausea and vomiting.  Review of Systems  Positive: Nausea, vomiting, cough Negative: Chest pain, abdominal pain  Physical Exam  BP 139/81   Pulse (!) 104   Temp 98.4 F (36.9 C) (Oral)   Resp 18   Ht 5\' 11"  (1.803 m)   Wt 78 kg   SpO2 96%   BMI 23.98 kg/m  Gen:   Awake, no distress   HEENT:  Mucous membranes dry Resp:  Normal effort  Abd:  Nontender MSK:   Moves extremities without difficulty    Medical Decision Making  Medically screening exam initiated at 10:19 AM.  Appropriate orders placed.  Samuel Sloan was informed that the remainder of the evaluation will be completed by another provider, this initial triage assessment does not replace that evaluation, and the importance of remaining in the ED until their evaluation is complete.  Patient with a history of dementia, presenting with nausea and vomiting, history limited by patient's mental status. Presenting from home with concern for dehydration, nausea and vomiting. Confused on arrival, unclear baseline, will initiate broad based workup, labs and imaging ordered.   Samuel Avena, MD 08/11/23 1027

## 2023-08-12 DIAGNOSIS — F411 Generalized anxiety disorder: Secondary | ICD-10-CM | POA: Diagnosis present

## 2023-08-12 DIAGNOSIS — H9190 Unspecified hearing loss, unspecified ear: Secondary | ICD-10-CM | POA: Diagnosis present

## 2023-08-12 DIAGNOSIS — F0393 Unspecified dementia, unspecified severity, with mood disturbance: Secondary | ICD-10-CM | POA: Diagnosis present

## 2023-08-12 DIAGNOSIS — Z79899 Other long term (current) drug therapy: Secondary | ICD-10-CM | POA: Diagnosis not present

## 2023-08-12 DIAGNOSIS — E860 Dehydration: Secondary | ICD-10-CM | POA: Diagnosis present

## 2023-08-12 DIAGNOSIS — F0390 Unspecified dementia without behavioral disturbance: Secondary | ICD-10-CM | POA: Diagnosis not present

## 2023-08-12 DIAGNOSIS — G2581 Restless legs syndrome: Secondary | ICD-10-CM | POA: Diagnosis present

## 2023-08-12 DIAGNOSIS — U071 COVID-19: Secondary | ICD-10-CM

## 2023-08-12 DIAGNOSIS — Z791 Long term (current) use of non-steroidal anti-inflammatories (NSAID): Secondary | ICD-10-CM | POA: Diagnosis not present

## 2023-08-12 DIAGNOSIS — Z888 Allergy status to other drugs, medicaments and biological substances status: Secondary | ICD-10-CM | POA: Diagnosis not present

## 2023-08-12 DIAGNOSIS — N400 Enlarged prostate without lower urinary tract symptoms: Secondary | ICD-10-CM | POA: Diagnosis present

## 2023-08-12 DIAGNOSIS — I10 Essential (primary) hypertension: Secondary | ICD-10-CM | POA: Diagnosis present

## 2023-08-12 DIAGNOSIS — Z885 Allergy status to narcotic agent status: Secondary | ICD-10-CM | POA: Diagnosis not present

## 2023-08-12 DIAGNOSIS — J1282 Pneumonia due to coronavirus disease 2019: Secondary | ICD-10-CM | POA: Diagnosis not present

## 2023-08-12 DIAGNOSIS — J189 Pneumonia, unspecified organism: Secondary | ICD-10-CM | POA: Diagnosis present

## 2023-08-12 DIAGNOSIS — R1312 Dysphagia, oropharyngeal phase: Secondary | ICD-10-CM | POA: Diagnosis present

## 2023-08-12 DIAGNOSIS — F32A Depression, unspecified: Secondary | ICD-10-CM | POA: Diagnosis present

## 2023-08-12 DIAGNOSIS — K219 Gastro-esophageal reflux disease without esophagitis: Secondary | ICD-10-CM | POA: Diagnosis present

## 2023-08-12 DIAGNOSIS — Z7982 Long term (current) use of aspirin: Secondary | ICD-10-CM | POA: Diagnosis not present

## 2023-08-12 DIAGNOSIS — Z7189 Other specified counseling: Secondary | ICD-10-CM | POA: Diagnosis not present

## 2023-08-12 DIAGNOSIS — I951 Orthostatic hypotension: Secondary | ICD-10-CM | POA: Diagnosis present

## 2023-08-12 DIAGNOSIS — F0394 Unspecified dementia, unspecified severity, with anxiety: Secondary | ICD-10-CM | POA: Diagnosis present

## 2023-08-12 DIAGNOSIS — Z886 Allergy status to analgesic agent status: Secondary | ICD-10-CM | POA: Diagnosis not present

## 2023-08-12 DIAGNOSIS — Z66 Do not resuscitate: Secondary | ICD-10-CM | POA: Diagnosis present

## 2023-08-12 DIAGNOSIS — Z8249 Family history of ischemic heart disease and other diseases of the circulatory system: Secondary | ICD-10-CM | POA: Diagnosis not present

## 2023-08-12 DIAGNOSIS — Z823 Family history of stroke: Secondary | ICD-10-CM | POA: Diagnosis not present

## 2023-08-12 DIAGNOSIS — J159 Unspecified bacterial pneumonia: Secondary | ICD-10-CM | POA: Diagnosis present

## 2023-08-12 DIAGNOSIS — Z86711 Personal history of pulmonary embolism: Secondary | ICD-10-CM | POA: Diagnosis not present

## 2023-08-12 DIAGNOSIS — F05 Delirium due to known physiological condition: Secondary | ICD-10-CM | POA: Diagnosis not present

## 2023-08-12 DIAGNOSIS — Z515 Encounter for palliative care: Secondary | ICD-10-CM | POA: Diagnosis not present

## 2023-08-12 LAB — BASIC METABOLIC PANEL
Anion gap: 10 (ref 5–15)
BUN: 21 mg/dL (ref 8–23)
CO2: 25 mmol/L (ref 22–32)
Calcium: 9.1 mg/dL (ref 8.9–10.3)
Chloride: 100 mmol/L (ref 98–111)
Creatinine, Ser: 1.1 mg/dL (ref 0.61–1.24)
GFR, Estimated: >60 mL/min (ref >60)
Glucose, Bld: 97 mg/dL (ref 70–99)
Potassium: 3.7 mmol/L (ref 3.5–5.1)
Sodium: 135 mmol/L (ref 135–145)

## 2023-08-12 LAB — CBC
HCT: 35.7 % — ABNORMAL LOW (ref 39.0–52.0)
Hemoglobin: 11.9 g/dL — ABNORMAL LOW (ref 13.0–17.0)
MCH: 32.3 pg (ref 26.0–34.0)
MCHC: 33.3 g/dL (ref 30.0–36.0)
MCV: 97 fL (ref 80.0–100.0)
Platelets: 179 10*3/uL (ref 150–400)
RBC: 3.68 MIL/uL — ABNORMAL LOW (ref 4.22–5.81)
RDW: 13.2 % (ref 11.5–15.5)
WBC: 10.1 10*3/uL (ref 4.0–10.5)
nRBC: 0 % (ref 0.0–0.2)

## 2023-08-12 MED ORDER — HALOPERIDOL LACTATE 5 MG/ML IJ SOLN
1.0000 mg | Freq: Once | INTRAMUSCULAR | Status: AC
  Administered 2023-08-12: 1 mg via INTRAVENOUS
  Filled 2023-08-12: qty 1

## 2023-08-12 NOTE — Evaluation (Signed)
Occupational Therapy Evaluation Patient Details Name: Samuel Sloan MRN: [id number] DOB: 1934-07-16 Today's Date: 08/12/2023   History of Present Illness Samuel Sloan is a 89 y.o. male  comes to ED 08/11/23 with weakness , confusion, recentlu Diagnosed with Covid.PMH:  BPH, GERD, hypertension, history of PE, IVC filter, COPD. TBI, SDH   Clinical Impression   Pt completed toilet transfer from bedside recliner to the bathroom and completed washing hands in standing at min assist level.  Pt not oriented to place, time, or situation and exhibits severe HOH.  Unable to determine actual baseline level of function, but one of his neighbors was in the room with him and says she helps sometimes.  Feel pt will benefit from acute care OT at this time to help increase balance, safety, and independence with basic selfcare tasks.  Recommend 24 hour assist post discharge and likely inpatient follow up therapy, <3 hours/day to continue progression.          If plan is discharge home, recommend the following: A little help with walking and/or transfers;A little help with bathing/dressing/bathroom;Direct supervision/assist for medications management;Supervision due to cognitive status;Direct supervision/assist for financial management    Functional Status Assessment  Patient has had a recent decline in their functional status and demonstrates the ability to make significant improvements in function in a reasonable and predictable amount of time.  Equipment Recommendations  None recommended by OT       Precautions / Restrictions Precautions Precautions: Fall Precaution Comments: airborne precautions Restrictions Weight Bearing Restrictions Per Provider Order: No      Mobility Bed Mobility Overal bed mobility: Needs Assistance Bed Mobility: Sit to Supine       Sit to supine: Contact guard assist        Transfers Overall transfer level: Needs assistance Equipment used: None Transfers:  Sit to/from Stand, Bed to chair/wheelchair/BSC Sit to Stand: Min assist     Step pivot transfers: Min assist     General transfer comment: Min hand held assist for mobility to and from the bathroom..      Balance Overall balance assessment: Needs assistance Sitting-balance support: Feet supported, Bilateral upper extremity supported Sitting balance-Leahy Scale: Fair     Standing balance support: During functional activity, Bilateral upper extremity supported, Reliant on assistive device for balance Standing balance-Leahy Scale: Poor Standing balance comment: Pt needs UE support for standing balance                           ADL either performed or assessed with clinical judgement   ADL Overall ADL's : Needs assistance/impaired Eating/Feeding: Set up;Sitting   Grooming: Wash/dry hands;Standing;Contact guard assist   Upper Body Bathing: Set up;Sitting   Lower Body Bathing: Minimal assistance   Upper Body Dressing : Set up;Sitting   Lower Body Dressing: Minimal assistance;Sit to/from stand   Toilet Transfer: Minimal assistance;Ambulation   Toileting- Clothing Manipulation and Hygiene: Minimal assistance;Sit to/from stand       Functional mobility during ADLs: Minimal assistance (ambulation without assistive device) General ADL Comments: Pt currently confused with increased difficulty providing any PLOF information.  Pt's neighbor/friend in the room and states he lives beside of her and spends a lot of time in her apartment or she goes to his, but not able to provide 24 hour.  Min assist for balance with transfer to and from the bathroom without use of an assistive device.     Vision Baseline Vision/History: 1 Wears glasses Ability  to See in Adequate Light: 0 Adequate Patient Visual Report: No change from baseline Vision Assessment?: No apparent visual deficits     Perception Perception: Within Functional Limits       Praxis Praxis: WFL        Pertinent Vitals/Pain Pain Assessment Pain Assessment: No/denies pain     Extremity/Trunk Assessment Upper Extremity Assessment Upper Extremity Assessment: Overall WFL for tasks assessed   Lower Extremity Assessment Lower Extremity Assessment: Defer to PT evaluation   Cervical / Trunk Assessment Cervical / Trunk Assessment: Kyphotic   Communication Communication Communication: Hearing impairment   Cognition Arousal: Alert Behavior During Therapy: Flat affect Overall Cognitive Status: Impaired/Different from baseline Area of Impairment: Orientation, Attention, Memory, Following commands, Awareness                 Orientation Level: Disoriented to, Place, Time, Situation Current Attention Level: Sustained   Following Commands: Follows one step commands with increased time, Follows multi-step commands inconsistently   Awareness: Intellectual                    Home Living Family/patient expects to be discharged to:: Assisted living                             Home Equipment: Agricultural consultant (2 wheels)   Additional Comments: [facility] in [facility] but patient performs ADL's and ambulatory per sign other in room.  Pt's friend in the room states she helps him out a lot but not 24 hour      Prior Functioning/Environment Prior Level of Function : Needs assist  Cognitive Assist : ADLs (cognitive)           Mobility Comments: pt ambulates independently to dining room ADLs Comments: staff assists w/ medication,        OT Problem List: Decreased strength;Decreased activity tolerance;Impaired balance (sitting and/or standing);Decreased cognition;Decreased knowledge of use of DME or AE;Decreased knowledge of precautions;Pain      OT Treatment/Interventions: Self-care/ADL training;DME and/or AE instruction;Therapeutic activities;Balance training;Patient/family education    OT Goals(Current goals can be found in the care plan  section) Acute Rehab OT Goals Patient Stated Goal: Pt did not state but agreeable to working in therapy. OT Goal Formulation: With patient Time For Goal Achievement: 08/26/23 Potential to Achieve Goals: Good  OT Frequency: Min 1X/week       AM-PAC OT "6 Clicks" Daily Activity     Outcome Measure Help from another person eating meals?: None Help from another person taking care of personal grooming?: A Little Help from another person toileting, which includes using toliet, bedpan, or urinal?: A Little Help from another person bathing (including washing, rinsing, drying)?: A Little Help from another person to put on and taking off regular upper body clothing?: A Little Help from another person to put on and taking off regular lower body clothing?: A Little 6 Click Score: 19   End of Session Nurse Communication: Mobility status  Activity Tolerance: Patient tolerated treatment well Patient left: in bed;with call bell/phone within reach;with bed alarm set;with family/visitor present  OT Visit Diagnosis: Unsteadiness on feet (R26.81);Repeated falls (R29.6);Muscle weakness (generalized) (M62.81);Other abnormalities of gait and mobility (R26.89)                Time: 1610-9604 OT Time Calculation (min): 34 min Charges:  OT General Charges $OT Visit: 1 Visit OT Evaluation $OT Eval Moderate Complexity: 1 Mod OT Treatments $Self Care/Home  Management : 8-22 mins  Perrin Maltese, OTR/L Acute Rehabilitation Services  Office [phone] 08/12/2023

## 2023-08-12 NOTE — Progress Notes (Addendum)
PROGRESS NOTE    Samuel Sloan  UUV:[id number] DOB: 1934/01/17 DOA: 08/11/2023 PCP: Mattie Marlin, DO    Brief Narrative:   Samuel Sloan is a 89 y.o. male with medical history significant for BPH, GERD, hypertension, history of PE not on anticoagulation, recent COVID-19 infection being admitted to the hospital with concern for post COVID-pneumonia.  Apparently, patient was at [facility] this past November, recently tested positive for COVID on December 12.  As per the significant other patient had been having progressive weakness with cough and difficulty performing ADLs and needs a higher level of care.  Patient is very hard of hearing, and has difficulty participating in conversation, though his significant other [name] who is at the bedside helps with communication.  Workup in the ER, showed mild tachycardia but patient was afebrile.  CBC with WBC at 8.6.  COVID test was positive.  Potassium was low at 3.2.  UA was negative for infection.  Shows concern for possible community-acquired pneumonia, and he is still COVID-positive.   Assessment/Plan   Post COVID-pneumonia Patient presented with cough, weakness, chest x-ray abnormalities and subjective fever at home.  Continue IV azithromycin and Rocephin.  Continue to monitor this closely.   Progressive weakness, debility, deconditioning. At the independent living facility.  Will get PT OT evaluation.  Likely need skilled nursing facility placement.   Anxiety and depression-Wellbutrin, Klonopin, Risperdal   Hypertension-HCTZ, losartan   GERD-Protonix   BPH-Flomax     DVT prophylaxis: enoxaparin (LOVENOX) injection 40 mg Start: 08/11/23 2200 SCDs Start: 08/11/23 1359   Code Status:     Code Status: Do not attempt resuscitation (DNR) PRE-ARREST INTERVENTIONS DESIRED  Disposition: Likely to skilled nursing facility in 1 to 2 days.  Status is: Observation  The patient will require care spanning > 2 midnights and should be  moved to inpatient because: IV antibiotic, need for rehabilitation at a skilled nursing facility level.   Family Communication:  Spoke with the patient's daughter on the phone and updated her about the clinical condition of the patient.  Consultants:  None  Procedures:  None  Antimicrobials:  Rocephin and Zithromax IV  Anti-infectives (From admission, onward)    Start     Dose/Rate Route Frequency Ordered Stop   08/12/23 1000  azithromycin (ZITHROMAX) 500 mg in sodium chloride 0.9 % 250 mL IVPB        500 mg 250 mL/hr over 60 Minutes Intravenous Every 24 hours 08/11/23 1410     08/12/23 1000  cefTRIAXone (ROCEPHIN) 1 g in sodium chloride 0.9 % 100 mL IVPB        1 g 200 mL/hr over 30 Minutes Intravenous Every 24 hours 08/11/23 1410     08/11/23 1330  cefTRIAXone (ROCEPHIN) 1 g in sodium chloride 0.9 % 100 mL IVPB        1 g 200 mL/hr over 30 Minutes Intravenous  Once 08/11/23 1323 08/11/23 1416   08/11/23 1330  azithromycin (ZITHROMAX) 500 mg in sodium chloride 0.9 % 250 mL IVPB        500 mg 250 mL/hr over 60 Minutes Intravenous  Once 08/11/23 1323 08/11/23 1644       Subjective: Today, patient was seen and examined at bedside. Patient is confused and disoriented with episodes of agitation and on mittens.  Has been having generalized weakness.  Objective: Vitals:   08/11/23 1634 08/11/23 2017 08/12/23 0023 08/12/23 0417  BP: 101/60 98/65 96/62  111/71  Pulse: 72 74 62 73  Resp: 17  18 18 18   Temp: 97.7 F (36.5 C) 98.3 F (36.8 C) 98 F (36.7 C) (!) 97.4 F (36.3 C)  TempSrc: Oral Oral Oral Oral  SpO2: 97% 96% 95% 95%  Weight:      Height:        Intake/Output Summary (Last 24 hours) at 08/12/2023 1028 Last data filed at 08/12/2023 0500 Gross per 24 hour  Intake 351.9 ml  Output 300 ml  Net 51.9 ml   Filed Weights   08/11/23 1004  Weight: 78 kg    Physical Examination:  Body mass index is 23.98 kg/m.   General:  Average built, not in obvious  distress, hard of hearing HENT:   No scleral pallor or icterus noted. Oral mucosa is moist.  Chest:    Diminished breath sounds bilaterally. Coarse breath sounds CVS: S1 &S2 heard. No murmur.  Regular rate and rhythm. Abdomen: Soft, nontender, nondistended.  Bowel sounds are heard.   Extremities: No cyanosis, clubbing or edema.  Peripheral pulses are palpable.  On restraints. Psych: Alert, awake and disoriented and confused CNS:  No cranial nerve deficits.  Generalized weakness noted Skin: Warm and dry.  No rashes noted.  Data Reviewed:   CBC: Recent Labs  Lab 08/11/23 1006 08/12/23 0548  WBC 8.6 10.1  HGB 15.3 11.9*  HCT 44.5 35.7*  MCV 94.9 97.0  PLT 188 179    Basic Metabolic Panel: Recent Labs  Lab 08/11/23 1006 08/12/23 0548  NA 140 135  K 3.2* 3.7  CL 102 100  CO2 25 25  GLUCOSE 96 97  BUN 18 21  CREATININE 0.96 1.10  CALCIUM 10.7* 9.1    Liver Function Tests: Recent Labs  Lab 08/11/23 1006  AST 37  ALT 42  ALKPHOS 65  BILITOT 1.1  PROT 6.6  ALBUMIN 4.0     Radiology Studies: CT HEAD WO CONTRAST ( ) Result Date: 08/11/2023 CLINICAL DATA:  Mental status change, unknown cause EXAM: CT HEAD WITHOUT CONTRAST TECHNIQUE: Contiguous axial images were obtained from the base of the skull through the vertex without intravenous contrast. RADIATION DOSE REDUCTION: This exam was performed according to the departmental dose-optimization program which includes automated exposure control, adjustment of the mA and/or kV according to patient size and/or use of iterative reconstruction technique. COMPARISON:  None Available. FINDINGS: Brain: No evidence of acute infarction, hemorrhage, hydrocephalus, extra-axial collection or mass lesion/mass effect. Cerebral atrophy. Vascular: No hyperdense vessel. Skull: No acute fracture. Sinuses/Orbits: Clear sinuses.  No acute orbital findings. Other: Left cochlear implant. IMPRESSION: No evidence of acute intracranial abnormality.  Electronically Signed   By: Feliberto Harts M.D.   On: 08/11/2023 13:18   CT ABDOMEN PELVIS W CONTRAST Result Date: 08/11/2023 CLINICAL DATA:  Nausea and vomiting. EXAM: CT ABDOMEN AND PELVIS WITH CONTRAST TECHNIQUE: Multidetector CT imaging of the abdomen and pelvis was performed using the standard protocol following bolus administration of intravenous contrast. RADIATION DOSE REDUCTION: This exam was performed according to the departmental dose-optimization program which includes automated exposure control, adjustment of the mA and/or kV according to patient size and/or use of iterative reconstruction technique. CONTRAST:  OMNIPAQUE IOHEXOL 300 MG/ML  SOLN COMPARISON:  Same day chest radiograph, radiograph of the sacrum dated 04/14/2023 FINDINGS: Lower chest: Left lower lobe predominant patchy ground-glass and interstitial opacities. No pleural effusion or pneumothorax demonstrated. Partially imaged heart size is normal. Hepatobiliary: Subcentimeter segment 4/8 hypodensity (9:16), too small to characterize. No intra or extrahepatic biliary ductal dilation. Cholecystectomy. Pancreas: No focal lesions  or main ductal dilation. Spleen: Hypodensity near the splenic hilum (9:16), too small to characterize. Scattered calcified granulomata. Normal size. Adrenals/Urinary Tract: No adrenal nodules. No hydronephrosis. Nonobstructing right lower pole stones measuring up to 6 mm. Focus of cortical atrophy adjacent to the stones in the lower pole right kidney. No suspicious renal masses. No focal bladder wall thickening. Stomach/Bowel: Normal appearance of the stomach. No evidence of bowel wall thickening, distention, or inflammatory changes. Colonic diverticulosis without acute diverticulitis. Rounded radiodensity in the rectum may reflect ingested material. Appendix is not discretely seen. Vascular/Lymphatic: Aortic atherosclerosis. 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 No  enlarged abdominal or pelvic lymph nodes. Reproductive: Enlargement of the prostate with median lobe hypertrophy. Other: No free fluid, fluid collection, or free air. Musculoskeletal: New fracture involving the anterior superior endplate of L5 with associated compression deformity of L5. Additional multilevel degenerative changes of the lumbar spine. Multilevel degenerative changes of the partially imaged thoracic and lumbar spine. Small fat-containing right inguinal hernia. IMPRESSION: 1. New fracture involving the anterior superior endplate of L5 with associated compression deformity of L5 compared to 04/14/2023. 2. Left lower lobe predominant patchy ground-glass and interstitial opacities, in keeping with COVID-19 infection. 3. Nonobstructing right lower pole renal stones measuring up to 6 mm. 4. Colonic diverticulosis without acute diverticulitis. 5. Prostatomegaly. 6. Aortic Atherosclerosis (ICD10-I70.0). Electronically Signed   By: Agustin Cree M.D.   On: 08/11/2023 13:00   DG Chest Portable 1 View Result Date: 08/11/2023 CLINICAL DATA:  Cough vomiting. EXAM: PORTABLE CHEST 1 VIEW COMPARISON:  07/31/2022. FINDINGS: Trachea is midline. Heart size stable. Left perihilar and left lower lobe airspace opacification. There may be minimal involvement of the right lung base. No pleural fluid. Probable enchondroma or bone infarct in the proximal right humerus. IMPRESSION: Left perihilar and left lower lobe airspace opacification, likely due to pneumonia. Given the history of vomiting, aspiration is also considered. There may be early/mild involvement of the right lung base. Followup PA and lateral chest X-ray is recommended in 3-4 weeks following trial of antibiotic therapy to ensure resolution and exclude underlying malignancy. Electronically Signed   By: Leanna Battles M.D.   On: 08/11/2023 10:57      LOS: 0 days    Joycelyn Das, MD [facility] Available via Epic secure chat 7am-7pm After these  hours, please refer to coverage provider listed on [email or web address] 08/12/2023, 10:28 AM

## 2023-08-12 NOTE — Plan of Care (Signed)
  Problem: Coping: Goal: Psychosocial and spiritual needs will be supported Outcome: Progressing   Problem: Respiratory: Goal: Will maintain a patent airway Outcome: Progressing   Problem: Clinical Measurements: Goal: Ability to maintain clinical measurements within normal limits will improve Outcome: Progressing Goal: Will remain free from infection Outcome: Progressing Goal: Diagnostic test results will improve Outcome: Progressing Goal: Respiratory complications will improve Outcome: Progressing Goal: Cardiovascular complication will be avoided Outcome: Progressing   Problem: Activity: Goal: Risk for activity intolerance will decrease Outcome: Progressing   Problem: Nutrition: Goal: Adequate nutrition will be maintained Outcome: Progressing   Problem: Elimination: Goal: Will not experience complications related to bowel motility Outcome: Progressing Goal: Will not experience complications related to urinary retention Outcome: Progressing   Problem: Safety: Goal: Ability to remain free from injury will improve Outcome: Progressing   Problem: Skin Integrity: Goal: Risk for impaired skin integrity will decrease Outcome: Progressing   Problem: Education: Goal: Knowledge of risk factors and measures for prevention of condition will improve Outcome: Not Progressing   Problem: Respiratory: Goal: Complications related to the disease process, condition or treatment will be avoided or minimized Outcome: Not Progressing   Problem: Health Behavior/Discharge Planning: Goal: Ability to manage health-related needs will improve Outcome: Not Progressing   Problem: Coping: Goal: Level of anxiety will decrease Outcome: Not Progressing   Problem: Pain Management: Goal: General experience of comfort will improve Outcome: Not Progressing   Problem: Safety: Goal: Non-violent Restraint(s) Outcome: Not Progressing

## 2023-08-12 NOTE — Evaluation (Signed)
Physical Therapy Evaluation Patient Details Name: Samuel Sloan MRN: [id number] DOB: Dec 22, 1933 Today's Date: 08/12/2023  History of Present Illness  Samuel Sloan is a 89 y.o. male  comes to ED 08/11/23 with weakness , confusion, recentlu Diagnosed with Covid.PMH:  BPH, GERD, hypertension, history of PE, IVC filter, COPD. TBI, SDH  Clinical Impression  Pt admitted with above diagnosis.  Pt currently with functional limitations due to the deficits listed below (see PT Problem List). Pt will benefit from acute skilled PT to increase their independence and safety with mobility to allow discharge.     The patient is restless in bed, friend at bedside to answer questions as patient is unable, very limited hearing. Per [name], friend, Patient  resides in retirement community[facility] ALF, is ambulatory with a RW, performs own ADL's at baseline, has assistance with medication from facility, daughter assists with transportation.  The patient  is 1 person assist to move to recliner  using Rw. Patient fell asleep  quickly after settled in recliner.  Patient will benefit from continued inpatient follow up therapy, <3 hours/day.       If plan is discharge home, recommend the following: A little help with walking and/or transfers;A little help with bathing/dressing/bathroom;Assistance with cooking/housework;Assist for transportation   Can travel by private vehicle   No    Equipment Recommendations None recommended by PT  Recommendations for Other Services       Functional Status Assessment       Precautions / Restrictions Precautions Precautions: Fall Restrictions Weight Bearing Restrictions Per Provider Order: No      Mobility  Bed Mobility Overal bed mobility: Needs Assistance Bed Mobility: Supine to Sit     Supine to sit: Contact guard     General bed mobility comments: multimodal cues , visual cues to move to bed edge, required no assistance    Transfers Overall transfer level:  Needs assistance Equipment used: Rolling walker (2 wheels) Transfers: Sit to/from Stand, Bed to chair/wheelchair/BSC Sit to Stand: Min assist   Step pivot transfers: Min assist       General transfer comment: multimodal cues to stand and step to recliner, slow  steps, steady assistance    Ambulation/Gait                  Stairs            Wheelchair Mobility     Tilt Bed    Modified Rankin (Stroke Patients Only)       Balance Overall balance assessment: Needs assistance Sitting-balance support: Feet supported, Bilateral upper extremity supported Sitting balance-Leahy Scale: Fair     Standing balance support: During functional activity, Bilateral upper extremity supported, Reliant on assistive device for balance Standing balance-Leahy Scale: Poor                               Pertinent Vitals/Pain Pain Assessment Pain Assessment: Faces Faces Pain Scale: No hurt    Home Living Family/patient expects to be discharged to:: Assisted living                 Home Equipment: Rolling Walker (2 wheels) Additional Comments: resides in retirement community- ALF but patient performs ADL's and ambulatory per sign other in room.    Prior Function Prior Level of Function : Needs assist             Mobility Comments: pt ambulates independently to dining room ADLs Comments: staff  assists w/ medication,     Extremity/Trunk Assessment   Upper Extremity Assessment Upper Extremity Assessment: Generalized weakness    Lower Extremity Assessment Lower Extremity Assessment: Generalized weakness    Cervical / Trunk Assessment Cervical / Trunk Assessment: Kyphotic  Communication   Communication Communication: Hearing impairment  Cognition Arousal: Lethargic Behavior During Therapy: Restless Overall Cognitive Status: Impaired/Different from baseline Area of Impairment: Orientation, Attention, Memory, Following commands, Awareness                  Orientation Level: Time, Situation Current Attention Level: Focused   Following Commands: Follows one step commands with increased time   Awareness: Intellectual   General Comments: HOH affects communication, significan other reports patien  oriented at baseline        General Comments      Exercises     Assessment/Plan    PT Assessment Patient needs continued PT services  PT Problem List Decreased strength;Decreased mobility;Decreased knowledge of precautions;Decreased activity tolerance;Decreased cognition;Decreased balance       PT Treatment Interventions DME instruction;Therapeutic activities;Cognitive remediation;Gait training;Therapeutic exercise;Patient/family education;Functional mobility training    PT Goals (Current goals can be found in the Care Plan section)  Acute Rehab PT Goals Patient Stated Goal: to return to his home PT Goal Formulation: With family Time For Goal Achievement: 08/26/23 Potential to Achieve Goals: Fair    Frequency Min 1X/week     Co-evaluation               AM-PAC PT "6 Clicks" Mobility  Outcome Measure Help needed turning from your back to your side while in a flat bed without using bedrails?: None Help needed moving from lying on your back to sitting on the side of a flat bed without using bedrails?: None Help needed moving to and from a bed to a chair (including a wheelchair)?: A Little Help needed standing up from a chair using your arms (e.g., wheelchair or bedside chair)?: A Little Help needed to walk in hospital room?: A Lot Help needed climbing 3-5 steps with a railing? : Total 6 Click Score: 17    End of Session Equipment Utilized During Treatment: Gait belt Activity Tolerance: Patient tolerated treatment well Patient left: in chair;with call bell/phone within reach;with family/visitor present;with chair alarm set Nurse Communication: Mobility status PT Visit Diagnosis: Muscle weakness (generalized)  (M62.81);Difficulty in walking, not elsewhere classified (R26.2)    Time: 5284-1324 PT Time Calculation (min) (ACUTE ONLY): 38 min   Charges:   PT Evaluation $PT Eval Low Complexity: 1 Low PT Treatments $Therapeutic Activity: 23-37 mins PT General Charges $$ ACUTE PT VISIT: 1 Visit         Blanchard Kelch PT Acute Rehabilitation Services Office [phone] Weekend pager-[phone]   Rada Hay 08/12/2023, 10:19 AM

## 2023-08-13 DIAGNOSIS — J1282 Pneumonia due to coronavirus disease 2019: Secondary | ICD-10-CM

## 2023-08-13 DIAGNOSIS — U071 COVID-19: Secondary | ICD-10-CM

## 2023-08-13 LAB — BASIC METABOLIC PANEL
Anion gap: 9 (ref 5–15)
BUN: 19 mg/dL (ref 8–23)
CO2: 26 mmol/L (ref 22–32)
Calcium: 8.8 mg/dL — ABNORMAL LOW (ref 8.9–10.3)
Chloride: 97 mmol/L — ABNORMAL LOW (ref 98–111)
Creatinine, Ser: 1.02 mg/dL (ref 0.61–1.24)
GFR, Estimated: >60 mL/min (ref >60)
Glucose, Bld: 97 mg/dL (ref 70–99)
Potassium: 3.6 mmol/L (ref 3.5–5.1)
Sodium: 132 mmol/L — ABNORMAL LOW (ref 135–145)

## 2023-08-13 LAB — CBC
HCT: 34.6 % — ABNORMAL LOW (ref 39.0–52.0)
Hemoglobin: 11.5 g/dL — ABNORMAL LOW (ref 13.0–17.0)
MCH: 32.1 pg (ref 26.0–34.0)
MCHC: 33.2 g/dL (ref 30.0–36.0)
MCV: 96.6 fL (ref 80.0–100.0)
Platelets: 179 10*3/uL (ref 150–400)
RBC: 3.58 MIL/uL — ABNORMAL LOW (ref 4.22–5.81)
RDW: 13.2 % (ref 11.5–15.5)
WBC: 6.8 10*3/uL (ref 4.0–10.5)
nRBC: 0 % (ref 0.0–0.2)

## 2023-08-13 LAB — MAGNESIUM: Magnesium: 2 mg/dL (ref 1.7–2.4)

## 2023-08-13 MED ORDER — AZITHROMYCIN 250 MG PO TABS
500.0000 mg | ORAL_TABLET | Freq: Every day | ORAL | Status: DC
  Administered 2023-08-13 – 2023-08-17 (×5): 500 mg via ORAL
  Filled 2023-08-13 (×5): qty 2

## 2023-08-13 NOTE — Plan of Care (Signed)
  Problem: Coping: Goal: Psychosocial and spiritual needs will be supported Outcome: Progressing   Problem: Coping: Goal: Level of anxiety will decrease Outcome: Progressing   Problem: Pain Management: Goal: General experience of comfort will improve Outcome: Progressing   Problem: Safety: Goal: Ability to remain free from injury will improve Outcome: Progressing

## 2023-08-13 NOTE — Plan of Care (Signed)
  Problem: Education: Goal: Knowledge of risk factors and measures for prevention of condition will improve Outcome: Progressing   Problem: Coping: Goal: Psychosocial and spiritual needs will be supported Outcome: Progressing   Problem: Respiratory: Goal: Will maintain a patent airway Outcome: Progressing   

## 2023-08-13 NOTE — Progress Notes (Signed)
PHARMACIST - PHYSICIAN COMMUNICATION DR:   Rebekah Chesterfield CONCERNING: Antibiotic IV to Oral Route Change Policy  RECOMMENDATION: This patient is receiving Zithromax by the intravenous route.  Based on criteria approved by the Pharmacy and Therapeutics Committee, the antibiotic(s) is/are being converted to the equivalent oral dose form(s).   DESCRIPTION: These criteria include: Patient being treated for a respiratory tract infection, urinary tract infection, cellulitis or clostridium difficile associated diarrhea if on metronidazole The patient is not neutropenic and does not exhibit a GI malabsorption state The patient is eating (either orally or via tube) and/or has been taking other orally administered medications for a least 24 hours The patient is improving clinically and has a Tmax < 100.5  If you have questions about this conversion, please contact the Pharmacy Department  []   [phone] )  [facility] []   [phone] )  [facility] []   [phone] )  [facility] []   [phone] )  [facility] [x]   [phone] )  [facility]     Patient evaluated in midst of national IV fluid shortage and ongoing critical operating PAR levels.  Junita Push, PharmD, BCPS 08/13/2023@11 :39 AM

## 2023-08-13 NOTE — Progress Notes (Signed)
PROGRESS NOTE    Samuel Sloan  WJX:[id number] DOB: February 13, 1934 DOA: 08/11/2023 PCP: Mattie Marlin, DO    Brief Narrative:   Samuel Sloan is a 89 y.o. male with medical history significant for BPH, GERD, hypertension, history of PE not on anticoagulation, recent COVID-19 infection being admitted to the hospital with concern for post COVID-pneumonia.  Apparently, patient was at [facility] this past November, recently tested positive for COVID on December 12.  As per the significant other, patient had been having progressive weakness with cough and difficulty performing ADLs and needs a higher level of care.  Patient is very hard of hearing, and has difficulty participating in conversation, though his significant other [name] who is at the bedside helps with communication.  Workup in the ER, showed mild tachycardia but patient was afebrile.  CBC with WBC at 8.6.  COVID test was positive.  Potassium was low at 3.2.  UA was negative for infection.  Patient being admitted for possible community-acquired pneumonia,  COVID-positive.   Assessment/Plan   Post COVID-pneumonia Patient presented with cough, weakness, chest x-ray abnormalities and subjective fever at home.  Continues to have some cough.  Continue IV azithromycin and Rocephin.  Continue to monitor this closely.  Afebrile at this time.  On room air   Progressive weakness, debility, deconditioning. At the independent living facility.  PT OT recommend skilled nursing facility placement.   Anxiety and depression-Wellbutrin, Klonopin, Risperdal   Hypertension-HCTZ, losartan   GERD-Protonix   BPH-Flomax     DVT prophylaxis: enoxaparin (LOVENOX) injection 40 mg Start: 08/11/23 2200 SCDs Start: 08/11/23 1359   Code Status:     Code Status: Do not attempt resuscitation (DNR) PRE-ARREST INTERVENTIONS DESIRED  Disposition: Likely to skilled nursing facility in 1 to 2 days.  Medically stable for disposition  Status is:  Inpatient The patient is inpatient because: IV antibiotic, need for rehabilitation at a skilled nursing facility level.   Family Communication:  Spoke with the patient's daughter on the phone on 12/24  Consultants:  None  Procedures:  None  Antimicrobials:  Rocephin and Zithromax IV  Anti-infectives (From admission, onward)    Start     Dose/Rate Route Frequency Ordered Stop   08/12/23 1000  azithromycin (ZITHROMAX) 500 mg in sodium chloride 0.9 % 250 mL IVPB        500 mg 250 mL/hr over 60 Minutes Intravenous Every 24 hours 08/11/23 1410     08/12/23 1000  cefTRIAXone (ROCEPHIN) 1 g in sodium chloride 0.9 % 100 mL IVPB        1 g 200 mL/hr over 30 Minutes Intravenous Every 24 hours 08/11/23 1410     08/11/23 1330  cefTRIAXone (ROCEPHIN) 1 g in sodium chloride 0.9 % 100 mL IVPB        1 g 200 mL/hr over 30 Minutes Intravenous  Once 08/11/23 1323 08/11/23 1416   08/11/23 1330  azithromycin (ZITHROMAX) 500 mg in sodium chloride 0.9 % 250 mL IVPB        500 mg 250 mL/hr over 60 Minutes Intravenous  Once 08/11/23 1323 08/11/23 1644       Subjective: Today, patient was seen and examined at bedside.  Patient appeared to be more awake today and denied any pain.  Difficult understand the conversation.   Nursing staff reported that-difficulty with swallowing   Objective: Vitals:   08/12/23 0417 08/12/23 1312 08/12/23 1922 08/13/23 0437  BP: 111/71 (!) 154/88 (!) 160/89 (!) 157/77  Pulse: 73 65 71  64  Resp: 18 18 17 17   Temp: (!) 97.4 F (36.3 C) (!) 97.4 F (36.3 C) 98 F (36.7 C) 97.9 F (36.6 C)  TempSrc: Oral Oral Oral Oral  SpO2: 95% 96% 98% 98%  Weight:      Height:        Intake/Output Summary (Last 24 hours) at 08/13/2023 1014 Last data filed at 08/13/2023 0600 Gross per 24 hour  Intake 590 ml  Output 2250 ml  Net -1660 ml   Filed Weights   08/11/23 1004  Weight: 78 kg    Physical Examination:  Body mass index is 23.98 kg/m.   General:  Average  built, not in obvious distress, hard of hearing, Communicative, difficult understand speech, HENT:   No scleral pallor or icterus noted. Oral mucosa is moist.  Chest:    Diminished breath sounds bilaterally. Coarse breath sounds noted bilaterally CVS: S1 &S2 heard. No murmur.  Regular rate and rhythm. Abdomen: Soft, nontender, nondistended.  Bowel sounds are heard.   Extremities: No cyanosis, clubbing or edema.  Peripheral pulses are palpable.   Psych: Alert, awake and disoriented, appears more calmer today, CNS:  No cranial nerve deficits.  Generalized weakness noted Skin: Warm and dry.  No rashes noted.  Data Reviewed:   CBC: Recent Labs  Lab 08/11/23 1006 08/12/23 0548 08/13/23 0522  WBC 8.6 10.1 6.8  HGB 15.3 11.9* 11.5*  HCT 44.5 35.7* 34.6*  MCV 94.9 97.0 96.6  PLT 188 179 179    Basic Metabolic Panel: Recent Labs  Lab 08/11/23 1006 08/12/23 0548 08/13/23 0522  NA 140 135 132*  K 3.2* 3.7 3.6  CL 102 100 97*  CO2 25 25 26   GLUCOSE 96 97 97  BUN 18 21 19   CREATININE 0.96 1.10 1.02  CALCIUM 10.7* 9.1 8.8*  MG  --   --  2.0    Liver Function Tests: Recent Labs  Lab 08/11/23 1006  AST 37  ALT 42  ALKPHOS 65  BILITOT 1.1  PROT 6.6  ALBUMIN 4.0     Radiology Studies: CT HEAD WO CONTRAST ( ) Result Date: 08/11/2023 CLINICAL DATA:  Mental status change, unknown cause EXAM: CT HEAD WITHOUT CONTRAST TECHNIQUE: Contiguous axial images were obtained from the base of the skull through the vertex without intravenous contrast. RADIATION DOSE REDUCTION: This exam was performed according to the departmental dose-optimization program which includes automated exposure control, adjustment of the mA and/or kV according to patient size and/or use of iterative reconstruction technique. COMPARISON:  None Available. FINDINGS: Brain: No evidence of acute infarction, hemorrhage, hydrocephalus, extra-axial collection or mass lesion/mass effect. Cerebral atrophy. Vascular: No  hyperdense vessel. Skull: No acute fracture. Sinuses/Orbits: Clear sinuses.  No acute orbital findings. Other: Left cochlear implant. IMPRESSION: No evidence of acute intracranial abnormality. Electronically Signed   By: Feliberto Harts M.D.   On: 08/11/2023 13:18   CT ABDOMEN PELVIS W CONTRAST Result Date: 08/11/2023 CLINICAL DATA:  Nausea and vomiting. EXAM: CT ABDOMEN AND PELVIS WITH CONTRAST TECHNIQUE: Multidetector CT imaging of the abdomen and pelvis was performed using the standard protocol following bolus administration of intravenous contrast. RADIATION DOSE REDUCTION: This exam was performed according to the departmental dose-optimization program which includes automated exposure control, adjustment of the mA and/or kV according to patient size and/or use of iterative reconstruction technique. CONTRAST:  OMNIPAQUE IOHEXOL 300 MG/ML  SOLN COMPARISON:  Same day chest radiograph, radiograph of the sacrum dated 04/14/2023 FINDINGS: Lower chest: Left lower lobe predominant patchy  ground-glass and interstitial opacities. No pleural effusion or pneumothorax demonstrated. Partially imaged heart size is normal. Hepatobiliary: Subcentimeter segment 4/8 hypodensity (9:16), too small to characterize. No intra or extrahepatic biliary ductal dilation. Cholecystectomy. Pancreas: No focal lesions or main ductal dilation. Spleen: Hypodensity near the splenic hilum (9:16), too small to characterize. Scattered calcified granulomata. Normal size. Adrenals/Urinary Tract: No adrenal nodules. No hydronephrosis. Nonobstructing right lower pole stones measuring up to 6 mm. Focus of cortical atrophy adjacent to the stones in the lower pole right kidney. No suspicious renal masses. No focal bladder wall thickening. Stomach/Bowel: Normal appearance of the stomach. No evidence of bowel wall thickening, distention, or inflammatory changes. Colonic diverticulosis without acute diverticulitis. Rounded radiodensity in the  rectum may reflect ingested material. Appendix is not discretely seen. Vascular/Lymphatic: Aortic atherosclerosis. 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 No enlarged abdominal or pelvic lymph nodes. Reproductive: Enlargement of the prostate with median lobe hypertrophy. Other: No free fluid, fluid collection, or free air. Musculoskeletal: New fracture involving the anterior superior endplate of L5 with associated compression deformity of L5. Additional multilevel degenerative changes of the lumbar spine. Multilevel degenerative changes of the partially imaged thoracic and lumbar spine. Small fat-containing right inguinal hernia. IMPRESSION: 1. New fracture involving the anterior superior endplate of L5 with associated compression deformity of L5 compared to 04/14/2023. 2. Left lower lobe predominant patchy ground-glass and interstitial opacities, in keeping with COVID-19 infection. 3. Nonobstructing right lower pole renal stones measuring up to 6 mm. 4. Colonic diverticulosis without acute diverticulitis. 5. Prostatomegaly. 6. Aortic Atherosclerosis (ICD10-I70.0). Electronically Signed   By: Agustin Cree M.D.   On: 08/11/2023 13:00   DG Chest Portable 1 View Result Date: 08/11/2023 CLINICAL DATA:  Cough vomiting. EXAM: PORTABLE CHEST 1 VIEW COMPARISON:  07/31/2022. FINDINGS: Trachea is midline. Heart size stable. Left perihilar and left lower lobe airspace opacification. There may be minimal involvement of the right lung base. No pleural fluid. Probable enchondroma or bone infarct in the proximal right humerus. IMPRESSION: Left perihilar and left lower lobe airspace opacification, likely due to pneumonia. Given the history of vomiting, aspiration is also considered. There may be early/mild involvement of the right lung base. Followup PA and lateral chest X-ray is recommended in 3-4 weeks following trial of antibiotic therapy to ensure resolution and exclude underlying malignancy.  Electronically Signed   By: Leanna Battles M.D.   On: 08/11/2023 10:57      LOS: 1 day    Joycelyn Das, MD [facility] Available via Epic secure chat 7am-7pm After these hours, please refer to coverage provider listed on [email or web address] 08/13/2023, 10:14 AM

## 2023-08-13 NOTE — Progress Notes (Incomplete)
Patients daughter came by this afternoon and was concerned on why the male pure wick was on. I told her that we can d/c the pure wick. He is very confused on what to do with the call light even though he knows when he is urinating he is going a lot and if he forgets to use the call light he is a high risk of falling.  She stated if he will be here more then another day she would like the pure wick d/c so he can ambulate again to the bathroom.  She would also like Social work to reach out to her as well. She also stated that for now she is on board with the use of the pure wick so he doesn't fall. Juleen Starr RN

## 2023-08-13 NOTE — Plan of Care (Signed)
  Problem: Education: Goal: Knowledge of risk factors and measures for prevention of condition will improve Outcome: Progressing   Problem: Activity: Goal: Risk for activity intolerance will decrease Outcome: Progressing   Problem: Coping: Goal: Level of anxiety will decrease Outcome: Progressing   Problem: Safety: Goal: Non-violent Restraint(s) Outcome: Progressing
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
## 2023-08-14 DIAGNOSIS — J1282 Pneumonia due to coronavirus disease 2019: Secondary | ICD-10-CM | POA: Diagnosis not present

## 2023-08-14 DIAGNOSIS — U071 COVID-19: Secondary | ICD-10-CM | POA: Diagnosis not present

## 2023-08-14 NOTE — TOC PASRR Note (Signed)
30 Day PASRR Note   Patient Details  Name: Elber Goscinski Date of Birth: [birth year]   Transition of Care [facility]) CM/SW Contact:    [name], LCSW Phone Number: 08/14/2023, 5:39 PM  To Whom It May Concern:  Please be advised that this patient will require a short-term nursing home stay - anticipated 30 days or less for rehabilitation and strengthening.   The plan is for return home.

## 2023-08-14 NOTE — [place] FL2 (Addendum)
[place] MEDICAID FL2 LEVEL OF CARE FORM     IDENTIFICATION  Patient Name: Samuel Sloan Birthdate: [birth year] Sex: male Admission Date (Current Location): 08/11/2023  [facility] and [place] Number:  Producer, television/film/video and Address:  [facility],  [address] [place], [place] [address]      Provider Number: [id number]  Attending Physician Name and Address:  Joycelyn Das, MD  Relative Name and Phone Number:  Toshiyuki, Jensen Daughter [phone]    boschult,mary Significant other [phone]    Current Level of Care: Hospital Recommended Level of Care: Skilled Nursing Facility Prior Approval Number:    Date Approved/Denied:   PASRR Number: Pending  Discharge Plan: SNF    Current Diagnoses: Patient Active Problem List   Diagnosis Date Noted   Pneumonia 08/12/2023   Pneumonia due to COVID-19 virus 08/11/2023   Hypomania (HCC) 09/25/2022   Seizure-like activity (HCC) 09/23/2022   History of pulmonary embolism 09/23/2022   History of subdural hematoma 09/23/2022   Depression with anxiety 09/23/2022   Chronic kidney disease, stage 3a (HCC) 09/23/2022   Generalized anxiety disorder    Obstructive sleep apnea    Major depressive disorder    Diverticulitis    Bronchitis    GERD (gastroesophageal reflux disease)    Hypertension    RLS (restless legs syndrome) 09/11/2016   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   BiPAP (biphasic positive airway pressure) dependence 06/24/2016   Daytime somnolence 06/24/2016   Fatigue 06/24/2016   Traumatic brain injury 05/29/2016   Frequent falls 11/14/2015   Hereditary and idiopathic peripheral neuropathy 11/14/2015   Concussion 07/14/2015   Bilateral dry eyes 05/21/2013    Orientation RESPIRATION BLADDER Height & Weight     Self, Place  Normal Incontinent Weight: 171 lb 15.3 oz (78 kg) Height:  5\' 11"  (180.3 cm)  BEHAVIORAL SYMPTOMS/MOOD NEUROLOGICAL BOWEL NUTRITION STATUS      Continent Diet  AMBULATORY  STATUS COMMUNICATION OF NEEDS Skin   Limited Assist Verbally Normal                       Personal Care Assistance Level of Assistance  Bathing, Feeding, Dressing Bathing Assistance: Limited assistance Feeding assistance: Independent Dressing Assistance: Limited assistance     Functional Limitations Info  Sight, Hearing, Speech Sight Info: Adequate Hearing Info: Impaired Speech Info: Adequate    SPECIAL CARE FACTORS FREQUENCY  PT (By licensed PT), OT (By licensed OT)     PT Frequency: Minimum 5x a week OT Frequency: Minimum 5x  a week            Contractures Contractures Info: Not present    Additional Factors Info  Code Status, Allergies, Psychotropic, Isolation Precautions Code Status Info: DNR Allergies Info: Verapamil  Amlodipine  Ibuprofen  Oxycodone  Toprol Xl  (Metoprolol)  Lamotrigine Psychotropic Info: buPROPion (WELLBUTRIN XL) 24 hr tablet 300 mg, clonazePAM (KLONOPIN) tablet 0.5 mg,   Isolation Precautions Info: Covid+ as of 07/31/2023 from [facility]     Current Medications (08/14/2023):  This is the current hospital active medication list Current Facility-Administered Medications  Medication Dose Route Frequency Provider Last Rate Last Admin   acetaminophen (TYLENOL) tablet 650 mg  650 mg Oral Q6H PRN Kirby Crigler, Mir M, MD       Or   acetaminophen (TYLENOL) suppository 650 mg  650 mg Rectal Q6H PRN Kirby Crigler, Mir M, MD       acetaminophen (TYLENOL) tablet 1,000 mg  1,000 mg Oral BID Lawsing,  Fayrene Fearing, MD   1,000 mg at 08/14/23 0905   acidophilus (RISAQUAD) capsule 1 capsule  1 capsule Oral Daily Ernie Avena, MD   1 capsule at 08/14/23 0904   albuterol (PROVENTIL) (2.5 MG/3ML) 0.083% nebulizer solution 2.5 mg  2.5 mg Nebulization Q2H PRN Maryln Gottron, MD       aspirin EC tablet 81 mg  81 mg Oral q AM Ernie Avena, MD   81 mg at 08/14/23 0904   azithromycin (ZITHROMAX) tablet 500 mg  500 mg Oral Daily Phylliss Blakes, RPH    500 mg at 08/14/23 2440   benzonatate (TESSALON) capsule 100 mg  100 mg Oral TID PRN Maryln Gottron, MD       bismuth subsalicylate (PEPTO BISMOL) 262 MG/15ML suspension 30 mL  30 mL Oral Q6H PRN Ernie Avena, MD       buPROPion (WELLBUTRIN XL) 24 hr tablet 300 mg  300 mg Oral Daily Ernie Avena, MD   300 mg at 08/14/23 0905   cefTRIAXone (ROCEPHIN) 1 g in sodium chloride 0.9 % 100 mL IVPB  1 g Intravenous Q24H Kirby Crigler, Mir M, MD 200 mL/hr at 08/14/23 1049 1 g at 08/14/23 1049   clonazePAM (KLONOPIN) tablet 0.5 mg  0.5 mg Oral BID Ernie Avena, MD   0.5 mg at 08/14/23 0904   enoxaparin (LOVENOX) injection 40 mg  40 mg Subcutaneous Q24H Kirby Crigler, Mir M, MD   40 mg at 08/13/23 2153   finasteride (PROSCAR) tablet 5 mg  5 mg Oral Daily Ernie Avena, MD   5 mg at 08/14/23 1027   guaiFENesin (ROBITUSSIN) 100 MG/5ML liquid 5 mL  5 mL Oral Q4H PRN Kirby Crigler, Mir M, MD       hydrochlorothiazide (HYDRODIURIL) tablet 12.5 mg  12.5 mg Oral Daily Ernie Avena, MD   12.5 mg at 08/14/23 2536   lipase/protease/amylase (CREON) capsule 72,000 Units  72,000 Units Oral TID Talbot Grumbling, MD   72,000 Units at 08/14/23 1801   loperamide (IMODIUM) capsule 2 mg  2 mg Oral QID PRN Ernie Avena, MD       losartan (COZAAR) tablet 100 mg  100 mg Oral Daily Ernie Avena, MD   100 mg at 08/14/23 6440   melatonin tablet 5 mg  5 mg Oral QHS Ernie Avena, MD   5 mg at 08/13/23 2152   menthol-cetylpyridinium (CEPACOL) lozenge 3 mg  1 lozenge Oral PRN Kirby Crigler, Mir M, MD       ondansetron [facility]) tablet 4 mg  4 mg Oral Q6H PRN Kirby Crigler, Mir M, MD       Or   ondansetron [facility]) injection 4 mg  4 mg Intravenous Q6H PRN Kirby Crigler, Mir M, MD       pantoprazole (PROTONIX) EC tablet 40 mg  40 mg Oral Daily Ernie Avena, MD   40 mg at 08/14/23 0905   phenol (CHLORASEPTIC) mouth spray 1 spray  1 spray Mouth/Throat PRN Ernie Avena, MD       pramipexole (MIRAPEX) tablet 1 mg  1 mg Oral QHS Ernie Avena, MD   1 mg at 08/13/23 2153   risperiDONE (RISPERDAL) tablet 0.5 mg  0.5 mg Oral QHS Ernie Avena, MD   0.5 mg at 08/13/23 2152   tamsulosin (FLOMAX) capsule 0.4 mg  0.4 mg Oral Daily Ernie Avena, MD   0.4 mg at 08/14/23 3474   traZODone (DESYREL) tablet 100 mg  100 mg Oral QHS Ernie Avena, MD   100 mg at 08/13/23  2152   traZODone (DESYREL) tablet 25 mg  25 mg Oral QHS PRN Maryln Gottron, MD         Discharge Medications: Please see discharge summary for a list of discharge medications.  Relevant Imaging Results:  Relevant Lab Results:   Additional Information SSN [id number]  [name], LCSW

## 2023-08-14 NOTE — Progress Notes (Signed)
PROGRESS NOTE    Samuel Sloan  WGN:[id number] DOB: 1934/03/17 DOA: 08/11/2023 PCP: Mattie Marlin, DO    Brief Narrative:   Samuel Sloan is a 89 y.o. male with medical history significant for BPH, GERD, hypertension, history of PE not on anticoagulation, recent COVID-19 infection being admitted to the hospital with concern for post COVID-pneumonia.  Apparently, patient was at [facility] this past November, recently tested positive for COVID on December 12.  As per the significant other, patient had been having progressive weakness with cough and difficulty performing ADLs and needs a higher level of care.  Patient is very hard of hearing, and has difficulty participating in conversation, though his significant other [name] who is at the bedside helps with communication.  Workup in the ER, showed mild tachycardia but patient was afebrile.  CBC with WBC at 8.6.  COVID test was positive.  Potassium was low at 3.2.  UA was negative for infection.  Patient being admitted for possible community-acquired pneumonia,  COVID-positive.  Awaiting for skilled nursing facility placement  Assessment/Plan   Post COVID-pneumonia Patient presented with cough, weakness, chest x-ray abnormalities and subjective fever at home.    Continue IV azithromycin and Rocephin complete course.  Afebrile on room air.  Could be transition to oral on discharge.  Concern for dysphagia.  Seen by speech therapy and recommend regular diet thin liquids.   Progressive weakness, debility, deconditioning. At the independent living facility.  PT OT recommend skilled nursing facility placement.   Anxiety and depression-Wellbutrin, Klonopin, Risperdal   Hypertension-HCTZ, losartan   GERD-Protonix   BPH-Flomax     DVT prophylaxis: enoxaparin (LOVENOX) injection 40 mg Start: 08/11/23 2200 SCDs Start: 08/11/23 1359   Code Status:     Code Status: Do not attempt resuscitation (DNR) PRE-ARREST INTERVENTIONS  DESIRED  Disposition:  skilled nursing facility  when bed available, medically stable for disposition  Status is: Inpatient  The patient is inpatient because: IV antibiotic, need for rehabilitation at a skilled nursing facility level.   Family Communication:  Spoke with the patient's daughter on the phone on 12/24  Consultants:  None  Procedures:  None  Antimicrobials:  Rocephin and Zithromax IV  Anti-infectives (From admission, onward)    Start     Dose/Rate Route Frequency Ordered Stop   08/13/23 1230  azithromycin (ZITHROMAX) tablet 500 mg        500 mg Oral Daily 08/13/23 1141     08/12/23 1000  azithromycin (ZITHROMAX) 500 mg in sodium chloride 0.9 % 250 mL IVPB  Status:  Discontinued        500 mg 250 mL/hr over 60 Minutes Intravenous Every 24 hours 08/11/23 1410 08/13/23 1141   08/12/23 1000  cefTRIAXone (ROCEPHIN) 1 g in sodium chloride 0.9 % 100 mL IVPB        1 g 200 mL/hr over 30 Minutes Intravenous Every 24 hours 08/11/23 1410     08/11/23 1330  cefTRIAXone (ROCEPHIN) 1 g in sodium chloride 0.9 % 100 mL IVPB        1 g 200 mL/hr over 30 Minutes Intravenous  Once 08/11/23 1323 08/11/23 1416   08/11/23 1330  azithromycin (ZITHROMAX) 500 mg in sodium chloride 0.9 % 250 mL IVPB        500 mg 250 mL/hr over 60 Minutes Intravenous  Once 08/11/23 1323 08/11/23 1644       Subjective: Today, patient was seen and examined at bedside.  Patient states that they feels okay.  Was  confused and disoriented.    Objective: Vitals:   08/13/23 1501 08/13/23 1942 08/13/23 2030 08/14/23 0554  BP: 139/75 (!) 174/90 (!) 164/84 (!) 165/97  Pulse: 66 67  80  Resp: 18 20  20   Temp: 98 F (36.7 C) 98 F (36.7 C)  98 F (36.7 C)  TempSrc: Oral     SpO2: 99% 99%  93%  Weight:      Height:        Intake/Output Summary (Last 24 hours) at 08/14/2023 1026 Last data filed at 08/14/2023 0954 Gross per 24 hour  Intake 300 ml  Output 1850 ml  Net -1550 ml   Filed Weights    08/11/23 1004  Weight: 78 kg    Physical Examination:  Body mass index is 23.98 kg/m.   General:  Average built, not in obvious distress, hard of hearing, Communicative, difficult understand speech, HENT:   No scleral pallor or icterus noted. Oral mucosa is moist.  Chest:    Diminished breath sounds bilaterally. Coarse breath sounds bilaterally. CVS: S1 &S2 heard. No murmur.  Regular rate and rhythm. Abdomen: Soft, nontender, nondistended.  Bowel sounds are heard.   Extremities: No cyanosis, clubbing or edema.  Peripheral pulses are palpable.   Psych: Alert, awake and disoriented,  CNS:  No cranial nerve deficits.  Generalized weakness noted Skin: Warm and dry.  No rashes noted.  Data Reviewed:   CBC: Recent Labs  Lab 08/11/23 1006 08/12/23 0548 08/13/23 0522  WBC 8.6 10.1 6.8  HGB 15.3 11.9* 11.5*  HCT 44.5 35.7* 34.6*  MCV 94.9 97.0 96.6  PLT 188 179 179    Basic Metabolic Panel: Recent Labs  Lab 08/11/23 1006 08/12/23 0548 08/13/23 0522  NA 140 135 132*  K 3.2* 3.7 3.6  CL 102 100 97*  CO2 25 25 26   GLUCOSE 96 97 97  BUN 18 21 19   CREATININE 0.96 1.10 1.02  CALCIUM 10.7* 9.1 8.8*  MG  --   --  2.0    Liver Function Tests: Recent Labs  Lab 08/11/23 1006  AST 37  ALT 42  ALKPHOS 65  BILITOT 1.1  PROT 6.6  ALBUMIN 4.0     Radiology Studies: No results found.     LOS: 2 days    Joycelyn Das, MD [facility] Available via Epic secure chat 7am-7pm After these hours, please refer to coverage provider listed on [email or web address] 08/14/2023, 10:26 AM

## 2023-08-14 NOTE — TOC Initial Note (Addendum)
Transition of Care [facility]) - Initial/Assessment Note    Patient Details  Name: Samuel Sloan MRN: [id number] Date of Birth: [birth year]  Transition of Care [facility] CM/SW Contact:    [name], LCSW Phone Number: 08/14/2023, 6:20 PM  Clinical Narrative:                  Patient is an 89 year old male who is alert and oriented x2.  Patient lives alone, but has a significant other who checks in on him.  CSW spoke to patient's daughter [name] [phone], via phone to complete assessment since he is Covid+.  Patient was at [facility] in November for short term rehab, patient has now been readmitted to the hospital for pneumonia.  Patient's daughter would like him to go to SNF again for rehab before he returns back home.  Normally patient is able to ambulate at home with a walker.  Patient has been Covid+ since 07/31/2023, test result was found in Care Everywhere, where patient was diagnosed at [facility].  CSW explained to patient's daughter that if he needs to go to SNF, they may or may not use the positive result from 07/31/2023.    Per patient's daughter she would prefer a 3 star and up facility based on Medicare's website.  CSW sent patient's information to 3 star and up facilities.  CSW explained to patient's daughter that if there are not any 3 star and up facilities available, she will have to consider lower star ratings.  Patient's daughter expressed understanding.  Patient is a Level 2 [facility], screen, TOC awaiting for [facility] number to come back.  Patient will also need insurance authorization prior to him discharging.  CSW explained to patient's daughter that patient will not have his full 20 days 100% covered by insurance, because he has not had a 60 day wellness period.  CSW explained to her, that he may have co pays soon once he goes to a SNF, but it will depend on the SNF and insurance company.  Patient's daughter expressed understanding and did not have  any questions.  Patinet has been faxed out to SNFs, awaiting bed offers.  TOC to continue to follow patient's progress throughout discharge planning.  Clinicals have been sent to [facility].  Expected Discharge Plan: Skilled Nursing Facility Barriers to Discharge: Continued Medical Work up, SNF Pending bed offer   Patient Goals and CMS Choice Patient states their goals for this hospitalization and ongoing recovery are:: To go to SNF for rehab, then return back home. CMS Medicare.gov Compare Post Acute Care list provided to:: Patient Represenative (must comment) (Spoke to patient's daughter via phone.) Choice offered to / list presented to : Adult Children [employer] ownership interest in [facility].provided to:: Adult Children    Expected Discharge Plan and Services In-house Referral: Clinical Social Work   Post Acute Care Choice: Skilled Nursing Facility Living arrangements for the past 2 months: Single Family Home                                      Prior Living Arrangements/Services Living arrangements for the past 2 months: Single Family Home Lives with:: Self Patient language and need for interpreter reviewed:: Yes Do you feel safe going back to the place where you live?: No   Patietn's daughter feels he needs SNF again prior to discharging back home.  Need for Family Participation in Patient Care: Yes (Comment) Care giver support system in place?: No (comment)   Criminal Activity/Legal Involvement Pertinent to Current Situation/Hospitalization: No - Comment as needed  Activities of Daily Living   ADL Screening (condition at time of admission) Independently performs ADLs?: No Does the patient have a NEW difficulty with bathing/dressing/toileting/self-feeding that is expected to last >3 days?: Yes (Initiates electronic notice to provider for possible OT consult) Does the patient have a NEW difficulty with getting in/out of bed, walking, or climbing stairs that  is expected to last >3 days?: Yes (Initiates electronic notice to provider for possible PT consult) Does the patient have a NEW difficulty with communication that is expected to last >3 days?: No Is the patient deaf or have difficulty hearing?: Yes Does the patient have difficulty seeing, even when wearing glasses/contacts?: No Does the patient have difficulty concentrating, remembering, or making decisions?: Yes  Permission Sought/Granted Permission sought to share information with : Facility Medical sales representative, Family Supports, Case Manager Permission granted to share information with : Yes, Release of Information Signed, Yes, Verbal Permission Granted  Share Information with NAME: Benno, Shangraw Daughter [phone]    boschult,mary Significant other [phone]  Permission granted to share info w AGENCY: SNF admissions        Emotional Assessment Appearance:: Appears stated age   Affect (typically observed): Appropriate, Calm, Stable Orientation: : Oriented to Self, Oriented to Situation Alcohol / Substance Use: Not Applicable Psych Involvement: No (comment)  Admission diagnosis:  Dehydration [E86.0] Elevated troponin [R79.89] Closed fracture of fifth lumbar vertebra, unspecified fracture morphology, initial encounter (HCC) [S32.059A] Dementia, unspecified dementia severity, unspecified dementia type, unspecified whether behavioral, psychotic, or mood disturbance or anxiety (HCC) [F03.90] Pneumonia due to COVID-19 virus [U07.1, J12.82] COVID-19 [U07.1] Pneumonia [J18.9] Patient Active Problem List   Diagnosis Date Noted   Pneumonia 08/12/2023   Pneumonia due to COVID-19 virus 08/11/2023   Hypomania (HCC) 09/25/2022   Seizure-like activity (HCC) 09/23/2022   History of pulmonary embolism 09/23/2022   History of subdural hematoma 09/23/2022   Depression with anxiety 09/23/2022   Chronic kidney disease, stage 3a (HCC) 09/23/2022   Generalized anxiety disorder     Obstructive sleep apnea    Major depressive disorder    Diverticulitis    Bronchitis    GERD (gastroesophageal reflux disease)    Hypertension    RLS (restless legs syndrome) 09/11/2016   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   BiPAP (biphasic positive airway pressure) dependence 06/24/2016   Daytime somnolence 06/24/2016   Fatigue 06/24/2016   Traumatic brain injury 05/29/2016   Frequent falls 11/14/2015   Hereditary and idiopathic peripheral neuropathy 11/14/2015   Concussion 07/14/2015   Bilateral dry eyes 05/21/2013   PCP:  Mattie Marlin, DO Pharmacy:   [facility] [phone] - [place], [place] - [address] AT [facility] OF [address] & [facility] [address] [place] [address] Phone: [phone] Fax: [phone]     Social Drivers of Health (SDOH) Social History: SDOH Screenings   Food Insecurity: No Food Insecurity (08/11/2023)  Housing: Low Risk  (08/11/2023)  Transportation Needs: No Transportation Needs (08/11/2023)  Utilities: Not At Risk (08/11/2023)  Financial Resource Strain: Low Risk  (06/30/2023)   Received from [facility]  Social Connections: Unknown (12/30/2021)   Received from [facility]  Tobacco Use: Low Risk  (08/11/2023)   SDOH Interventions:     Readmission Risk Interventions     No data to display

## 2023-08-15 DIAGNOSIS — U071 COVID-19: Secondary | ICD-10-CM | POA: Diagnosis not present

## 2023-08-15 DIAGNOSIS — J1282 Pneumonia due to coronavirus disease 2019: Secondary | ICD-10-CM | POA: Diagnosis not present

## 2023-08-15 MED ORDER — HALOPERIDOL LACTATE 5 MG/ML IJ SOLN
2.0000 mg | Freq: Once | INTRAMUSCULAR | Status: AC
  Administered 2023-08-16: 2 mg via INTRAVENOUS
  Filled 2023-08-15: qty 1

## 2023-08-15 NOTE — Plan of Care (Signed)
  Problem: Respiratory: Goal: Will maintain a patent airway Outcome: Progressing   Problem: Nutrition: Goal: Adequate nutrition will be maintained Outcome: Progressing   Problem: Pain Management: Goal: General experience of comfort will improve Outcome: Progressing   Problem: Safety: Goal: Ability to remain free from injury will improve Outcome: Progressing

## 2023-08-15 NOTE — Progress Notes (Addendum)
PT treatment late entry for 08/14/23   08/14/23 1336  PT Visit Information  Last PT Received On 08/14/23  Assistance Needed +1  History of Present Illness Samuel Sloan is a 89 y.o. male  comes to ED 08/11/23 with weakness , confusion, recently Diagnosed with Covid.PMH:  BPH, GERD, hypertension, history of PE, IVC filter, COPD. TBI, SDH. CT:New L5 anterior superior endplate fracture  Precautions  Precautions Fall  Precaution Comments airborne precautions  Restrictions  Weight Bearing Restrictions Per Provider Order No  Pain Assessment  Pain Assessment No/denies pain  Cognition  Arousal Alert  Overall Cognitive Status Impaired/Different from baseline  Area of Impairment Orientation;Attention;Memory;Following commands;Awareness  General Comments HOH affects communication, patient  reprts that he has had 3 concussions and his STM is affected. Able to to follow  gestures/cues, Has Cochlear implant attached and appears to hear better  Bed Mobility  Bed Mobility Supine to Sit  Supine to sit Contact guard;Used rails;HOB elevated  General bed mobility comments extra  time  to [facility]  fully to bed edge pt attempting to scoot but stuck on  bed  pad.  Transfers  Overall transfer level Needs assistance  Equipment used Rollator (4 wheels)  Transfers Sit to/from Stand  Sit to Stand Min assist  General transfer comment patient locked Rollator  brakes prior to stand, cues to unlock to ambulate  Ambulation/Gait  Ambulation/Gait assistance Min assist  Gait Distance (Feet) 20 Feet  Assistive device Rollator (4 wheels)  Gait Pattern/deviations Step-through pattern;Trunk flexed  General Gait Details patient able to ambulate around room and turn in tight space.  Gait velocity decr.  Balance  Overall balance assessment Independent  Sitting-balance support Feet supported;Bilateral upper extremity supported  Sitting balance-Leahy Scale Fair  Standing balance support During functional  activity;Bilateral upper extremity supported;Reliant on assistive device for balance  Standing balance-Leahy Scale Poor  Standing balance comment Pt needs UE support for standing balance  PT - End of Session  Equipment Utilized During Treatment Gait belt  Activity Tolerance Patient tolerated treatment well  Patient left in chair;with call bell/phone within reach;with chair alarm set  Nurse Communication Mobility status   PT - Assessment/Plan  PT Visit Diagnosis Muscle weakness (generalized) (M62.81);Difficulty in walking, not elsewhere classified (R26.2)  PT Frequency (ACUTE ONLY) Min 1X/week  Follow Up Recommendations Skilled nursing-short term rehab (<3 hours/day)  Can patient physically be transported by private vehicle Yes  Patient can return home with the following A little help with walking and/or transfers;A little help with bathing/dressing/bathroom;Assistance with cooking/housework;Assist for transportation  PT equipment None recommended by PT  AM-PAC PT "6 Clicks" Mobility Outcome Measure (Version 2)  Help needed turning from your back to your side while in a flat bed without using bedrails? 3  Help needed moving from lying on your back to sitting on the side of a flat bed without using bedrails? 3  Help needed moving to and from a bed to a chair (including a wheelchair)? 3  Help needed standing up from a chair using your arms (e.g., wheelchair or bedside chair)? 3  Help needed to walk in hospital room? 3  Help needed climbing 3-5 steps with a railing?  2  6 Click Score 17  Consider Recommendation of Discharge To: Home with [facility]  Progressive Mobility  What is the highest level of mobility based on the progressive mobility assessment? Level 4 (Walks with assist in room) - Balance while marching in place and cannot step forward and back - Complete  PT Goal Progression  Progress towards PT goals Progressing toward goals   Blanchard Kelch PT Acute Rehabilitation Services Office  [phone] Weekend pager-[phone]

## 2023-08-15 NOTE — Progress Notes (Addendum)
PROGRESS NOTE    Samuel Sloan  JYN:[id number] DOB: 08/17/1934 DOA: 08/11/2023 PCP: Mattie Marlin, DO    Brief Narrative:   Samuel Sloan is a 89 y.o. male with medical history significant for BPH, GERD, hypertension, history of PE not on anticoagulation, recent COVID-19 infection being admitted to the hospital with concern for post COVID-pneumonia.  Apparently, patient was at [facility] this past November, recently tested positive for COVID on December 12.  As per the significant other, patient had been having progressive weakness with cough and difficulty performing ADLs and needs a higher level of care.  Patient is very hard of hearing, and has difficulty participating in conversation, though his significant other [name] who is at the bedside helps with communication.  Workup in the ER, showed mild tachycardia but patient was afebrile.  CBC with WBC at 8.6.  COVID test was positive.  Potassium was low at 3.2.  UA was negative for infection.  Patient being admitted for possible community-acquired pneumonia,  COVID-positive.  Awaiting for skilled nursing facility placement  Assessment/Plan   Post COVID-pneumonia Patient presented with cough, weakness, chest x-ray abnormalities and subjective fever at home.    Continue IV azithromycin and Rocephin  to complete course.  Afebrile on room air.  Could be transition to oral on discharge.  Concern for dysphagia.  Seen by speech therapy and recommend regular diet thin liquids.   Progressive weakness, debility, deconditioning. At the independent living facility.  PT OT recommend skilled nursing facility placement.  Agitation confusion.  Likely hospital induced delirium/pneumonia, underlying cognitive dysfunction.  Will give 1 dose of Haldol.  Already on Risperdal from home   Anxiety and depression-Wellbutrin, Klonopin, Risperdal   Hypertension-HCTZ, losartan   GERD-Protonix   BPH-Flomax     DVT prophylaxis: enoxaparin (LOVENOX)  injection 40 mg Start: 08/11/23 2200 SCDs Start: 08/11/23 1359   Code Status:     Code Status: Do not attempt resuscitation (DNR) PRE-ARREST INTERVENTIONS DESIRED  Disposition:  skilled nursing facility  when bed available, medically stable for disposition  Status is: Inpatient  The patient is inpatient because: IV antibiotic, need for rehabilitation at a skilled nursing facility level.   Family Communication:  Spoke with the patient's daughter on the phone on 12/24  Consultants:  None  Procedures:  None  Antimicrobials:  Rocephin and Zithromax IV 12/23>  Anti-infectives (From admission, onward)    Start     Dose/Rate Route Frequency Ordered Stop   08/13/23 1230  azithromycin (ZITHROMAX) tablet 500 mg        500 mg Oral Daily 08/13/23 1141     08/12/23 1000  azithromycin (ZITHROMAX) 500 mg in sodium chloride 0.9 % 250 mL IVPB  Status:  Discontinued        500 mg 250 mL/hr over 60 Minutes Intravenous Every 24 hours 08/11/23 1410 08/13/23 1141   08/12/23 1000  cefTRIAXone (ROCEPHIN) 1 g in sodium chloride 0.9 % 100 mL IVPB        1 g 200 mL/hr over 30 Minutes Intravenous Every 24 hours 08/11/23 1410     08/11/23 1330  cefTRIAXone (ROCEPHIN) 1 g in sodium chloride 0.9 % 100 mL IVPB        1 g 200 mL/hr over 30 Minutes Intravenous  Once 08/11/23 1323 08/11/23 1416   08/11/23 1330  azithromycin (ZITHROMAX) 500 mg in sodium chloride 0.9 % 250 mL IVPB        500 mg 250 mL/hr over 60 Minutes Intravenous  Once 08/11/23  1323 08/11/23 1644       Subjective: Today, patient was seen and examined at bedside.  Patient states that they feels okay.  Still disoriented but does not complain of any dyspnea or chest pain.  Poor historian.  Nursing staff reported that he was trying to climb out of the bed  Objective: Vitals:   08/14/23 0554 08/14/23 1410 08/14/23 2005 08/15/23 0418  BP: (!) 165/97 115/64 124/86 (!) 157/65  Pulse: 80 79 74 94  Resp: 20 18 18 18   Temp: 98 F (36.7 C)  98 F (36.7 C) 97.8 F (36.6 C) 97.6 F (36.4 C)  TempSrc:   Oral   SpO2: 93% 99% 100% 97%  Weight:      Height:        Intake/Output Summary (Last 24 hours) at 08/15/2023 1014 Last data filed at 08/14/2023 1217 Gross per 24 hour  Intake --  Output 250 ml  Net -250 ml   Filed Weights   08/11/23 1004  Weight: 78 kg    Physical Examination:  Body mass index is 23.98 kg/m.   General:  Average built, not in obvious distress, hard of hearing, Communicative, difficult understand speech, HENT:   No scleral pallor or icterus noted. Oral mucosa is moist.  Chest:    Diminished breath sounds bilaterally. Coarse breath sounds bilaterally. CVS: S1 &S2 heard. No murmur.  Regular rate and rhythm. Abdomen: Soft, nontender, nondistended.  Bowel sounds are heard.   Extremities: No cyanosis, clubbing or edema.  Peripheral pulses are palpable.   Psych: Alert, awake and disoriented,  CNS:  No cranial nerve deficits.  Generalized weakness noted Skin: Warm and dry.  No rashes noted.  Data Reviewed:   CBC: Recent Labs  Lab 08/11/23 1006 08/12/23 0548 08/13/23 0522  WBC 8.6 10.1 6.8  HGB 15.3 11.9* 11.5*  HCT 44.5 35.7* 34.6*  MCV 94.9 97.0 96.6  PLT 188 179 179    Basic Metabolic Panel: Recent Labs  Lab 08/11/23 1006 08/12/23 0548 08/13/23 0522  NA 140 135 132*  K 3.2* 3.7 3.6  CL 102 100 97*  CO2 25 25 26   GLUCOSE 96 97 97  BUN 18 21 19   CREATININE 0.96 1.10 1.02  CALCIUM 10.7* 9.1 8.8*  MG  --   --  2.0    Liver Function Tests: Recent Labs  Lab 08/11/23 1006  AST 37  ALT 42  ALKPHOS 65  BILITOT 1.1  PROT 6.6  ALBUMIN 4.0     Radiology Studies: No results found.     LOS: 3 days    Joycelyn Das, MD [facility] Available via Epic secure chat 7am-7pm After these hours, please refer to coverage provider listed on [email or web address] 08/15/2023, 10:14 AM

## 2023-08-15 NOTE — Plan of Care (Signed)
  Problem: Education: Goal: Knowledge of risk factors and measures for prevention of condition will improve Outcome: Progressing   Problem: Coping: Goal: Psychosocial and spiritual needs will be supported Outcome: Progressing   Problem: Respiratory: Goal: Will maintain a patent airway Outcome: Progressing Goal: Complications related to the disease process, condition or treatment will be avoided or minimized Outcome: Progressing   Problem: Education: Goal: Knowledge of General Education information will improve Description: Including pain rating scale, medication(s)/side effects and non-pharmacologic comfort measures Outcome: Progressing   Problem: Health Behavior/Discharge Planning: Goal: Ability to manage health-related needs will improve Outcome: Progressing   Problem: Clinical Measurements: Goal: Ability to maintain clinical measurements within normal limits will improve Outcome: Progressing Goal: Will remain free from infection Outcome: Progressing Goal: Diagnostic test results will improve Outcome: Progressing Goal: Respiratory complications will improve Outcome: Progressing Goal: Cardiovascular complication will be avoided Outcome: Progressing   Problem: Activity: Goal: Risk for activity intolerance will decrease Outcome: Progressing   Problem: Nutrition: Goal: Adequate nutrition will be maintained Outcome: Progressing   Problem: Coping: Goal: Level of anxiety will decrease Outcome: Progressing   Problem: Elimination: Goal: Will not experience complications related to bowel motility Outcome: Progressing Goal: Will not experience complications related to urinary retention Outcome: Progressing   Problem: Pain Management: Goal: General experience of comfort will improve Outcome: Progressing   Problem: Safety: Goal: Ability to remain free from injury will improve Outcome: Progressing   Problem: Skin Integrity: Goal: Risk for impaired skin integrity will  decrease Outcome: Progressing   Problem: Safety: Goal: Non-violent Restraint(s) Outcome: Progressing

## 2023-08-15 NOTE — NC FL2 (Signed)
[place] MEDICAID FL2 LEVEL OF CARE FORM     IDENTIFICATION  Patient Name: Samuel Sloan Birthdate: August 22, 1933 Sex: male Admission Date (Current Location): 08/11/2023  [facility] and [place] Number:  Producer, television/film/video and Address:  [facility],  [address] [place], [place] [address]      Provider Number: [id number]  Attending Physician Name and Address:  Joycelyn Das, MD  Relative Name and Phone Number:  Edvin, Iddings Daughter [phone]    boschult,mary Significant other [phone]    Current Level of Care: Hospital Recommended Level of Care: Skilled Nursing Facility Prior Approval Number:    Date Approved/Denied:   PASRR Number: [id number]  Discharge Plan: SNF    Current Diagnoses: Patient Active Problem List   Diagnosis Date Noted   Pneumonia 08/12/2023   Pneumonia due to COVID-19 virus 08/11/2023   Hypomania (HCC) 09/25/2022   Seizure-like activity (HCC) 09/23/2022   History of pulmonary embolism 09/23/2022   History of subdural hematoma 09/23/2022   Depression with anxiety 09/23/2022   Chronic kidney disease, stage 3a (HCC) 09/23/2022   Generalized anxiety disorder    Obstructive sleep apnea    Major depressive disorder    Diverticulitis    Bronchitis    GERD (gastroesophageal reflux disease)    Hypertension    RLS (restless legs syndrome) 09/11/2016   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   BiPAP (biphasic positive airway pressure) dependence 06/24/2016   Daytime somnolence 06/24/2016   Fatigue 06/24/2016   Traumatic brain injury 05/29/2016   Frequent falls 11/14/2015   Hereditary and idiopathic peripheral neuropathy 11/14/2015   Concussion 07/14/2015   Bilateral dry eyes 05/21/2013    Orientation RESPIRATION BLADDER Height & Weight     Self, Place  Normal Incontinent Weight: 171 lb 15.3 oz (78 kg) Height:  5\' 11"  (180.3 cm)  BEHAVIORAL SYMPTOMS/MOOD NEUROLOGICAL BOWEL NUTRITION STATUS      Continent Diet  AMBULATORY  STATUS COMMUNICATION OF NEEDS Skin   Limited Assist Verbally Normal                       Personal Care Assistance Level of Assistance  Bathing, Feeding, Dressing Bathing Assistance: Limited assistance Feeding assistance: Independent Dressing Assistance: Limited assistance     Functional Limitations Info  Sight, Hearing, Speech Sight Info: Adequate Hearing Info: Impaired Speech Info: Adequate    SPECIAL CARE FACTORS FREQUENCY  PT (By licensed PT), OT (By licensed OT)     PT Frequency: Minimum 5x a week OT Frequency: Minimum 5x  a week            Contractures Contractures Info: Not present    Additional Factors Info  Code Status, Allergies, Psychotropic, Isolation Precautions Code Status Info: DNR Allergies Info: Verapamil  Amlodipine  Ibuprofen  Oxycodone  Toprol Xl  (Metoprolol)  Lamotrigine Psychotropic Info: buPROPion (WELLBUTRIN XL) 24 hr tablet 300 mg, clonazePAM (KLONOPIN) tablet 0.5 mg,   Isolation Precautions Info: Covid+ as of 07/31/2023 from [facility]     Current Medications (08/15/2023):  This is the current hospital active medication list Current Facility-Administered Medications  Medication Dose Route Frequency Provider Last Rate Last Admin   acetaminophen (TYLENOL) tablet 650 mg  650 mg Oral Q6H PRN Kirby Crigler, Mir M, MD       Or   acetaminophen (TYLENOL) suppository 650 mg  650 mg Rectal Q6H PRN Kirby Crigler, Mir M, MD       acetaminophen (TYLENOL) tablet 1,000 mg  1,000 mg Oral BID Lawsing,  Fayrene Fearing, MD   1,000 mg at 08/15/23 0915   acidophilus (RISAQUAD) capsule 1 capsule  1 capsule Oral Daily Ernie Avena, MD   1 capsule at 08/15/23 0916   albuterol (PROVENTIL) (2.5 MG/3ML) 0.083% nebulizer solution 2.5 mg  2.5 mg Nebulization Q2H PRN Maryln Gottron, MD       aspirin EC tablet 81 mg  81 mg Oral q AM Ernie Avena, MD   81 mg at 08/15/23 0914   azithromycin (ZITHROMAX) tablet 500 mg  500 mg Oral Daily Phylliss Blakes, RPH    500 mg at 08/15/23 0915   benzonatate (TESSALON) capsule 100 mg  100 mg Oral TID PRN Maryln Gottron, MD       bismuth subsalicylate (PEPTO BISMOL) 262 MG/15ML suspension 30 mL  30 mL Oral Q6H PRN Ernie Avena, MD       buPROPion (WELLBUTRIN XL) 24 hr tablet 300 mg  300 mg Oral Daily Ernie Avena, MD   300 mg at 08/15/23 0917   cefTRIAXone (ROCEPHIN) 1 g in sodium chloride 0.9 % 100 mL IVPB  1 g Intravenous Q24H Kirby Crigler, Mir M, MD 200 mL/hr at 08/15/23 0922 1 g at 08/15/23 2130   clonazePAM (KLONOPIN) tablet 0.5 mg  0.5 mg Oral BID Ernie Avena, MD   0.5 mg at 08/15/23 0914   enoxaparin (LOVENOX) injection 40 mg  40 mg Subcutaneous Q24H Kirby Crigler, Mir M, MD   40 mg at 08/14/23 2146   finasteride (PROSCAR) tablet 5 mg  5 mg Oral Daily Ernie Avena, MD   5 mg at 08/15/23 0916   guaiFENesin (ROBITUSSIN) 100 MG/5ML liquid 5 mL  5 mL Oral Q4H PRN Kirby Crigler, Mir M, MD       haloperidol lactate (HALDOL) injection 2 mg  2 mg Intravenous Once Pokhrel, Laxman, MD       hydrochlorothiazide (HYDRODIURIL) tablet 12.5 mg  12.5 mg Oral Daily Ernie Avena, MD   12.5 mg at 08/15/23 8657   lipase/protease/amylase (CREON) capsule 72,000 Units  72,000 Units Oral TID Talbot Grumbling, MD   72,000 Units at 08/15/23 1607   loperamide (IMODIUM) capsule 2 mg  2 mg Oral QID PRN Ernie Avena, MD       losartan (COZAAR) tablet 100 mg  100 mg Oral Daily Ernie Avena, MD   100 mg at 08/15/23 8469   melatonin tablet 5 mg  5 mg Oral QHS Ernie Avena, MD   5 mg at 08/14/23 2146   menthol-cetylpyridinium (CEPACOL) lozenge 3 mg  1 lozenge Oral PRN Kirby Crigler, Mir M, MD       ondansetron [facility]) tablet 4 mg  4 mg Oral Q6H PRN Kirby Crigler, Mir M, MD       Or   ondansetron [facility]) injection 4 mg  4 mg Intravenous Q6H PRN Kirby Crigler, Mir M, MD       pantoprazole (PROTONIX) EC tablet 40 mg  40 mg Oral Daily Ernie Avena, MD   40 mg at 08/15/23 0915   phenol (CHLORASEPTIC) mouth spray 1 spray  1 spray  Mouth/Throat PRN Ernie Avena, MD       pramipexole (MIRAPEX) tablet 1 mg  1 mg Oral QHS Ernie Avena, MD   1 mg at 08/14/23 2145   risperiDONE (RISPERDAL) tablet 0.5 mg  0.5 mg Oral QHS Ernie Avena, MD   0.5 mg at 08/14/23 2145   tamsulosin (FLOMAX) capsule 0.4 mg  0.4 mg Oral Daily Ernie Avena, MD   0.4 mg at 08/15/23 [phone]  traZODone (DESYREL) tablet 100 mg  100 mg Oral QHS Ernie Avena, MD   100 mg at 08/14/23 2145   traZODone (DESYREL) tablet 25 mg  25 mg Oral QHS PRN Maryln Gottron, MD         Discharge Medications: Please see discharge summary for a list of discharge medications.  Relevant Imaging Results:  Relevant Lab Results:   Additional Information SSN [id number]  [name], LCSW

## 2023-08-15 NOTE — Progress Notes (Signed)
Mobility Specialist - Progress Note   08/15/23 1518  Mobility  Activity Stood at bedside  Level of Assistance Moderate assist, patient does 50-74%  Assistive Device Four wheel walker  Range of Motion/Exercises Active  Activity Response Tolerated fair  Mobility Referral Yes  Mobility visit 1 Mobility  Mobility Specialist Start Time (ACUTE ONLY) 1500  Mobility Specialist Stop Time (ACUTE ONLY) 1517  Mobility Specialist Time Calculation (min) (ACUTE ONLY) 17 min   Received attempting to get out of bed, needing Mod A for bed mobility to get EOB. Mod A for STS, needing assistance in side steps up the bed. Pt requesting to walk however opted out due to Mod A for STS and Min A for side steps.  Attempted to explain why it was unsafe to ambulate today as he seemed fatigued and unable to hold himself up when standing. Mod A for the return to bed with all needs met and alarm on.  Marilynne Halsted Mobility Specialist

## 2023-08-15 NOTE — TOC Progression Note (Addendum)
Transition of Care [facility]) - Progression Note    Patient Details  Name: Samuel Sloan MRN: [id number] Date of Birth: 01-31-1934  Transition [facility] CM/SW Contact  [name], [place] Phone Number: 08/15/2023, 11:11 AM  Clinical Narrative:     CSW updated patient's daughter [name] that CSW was still waiting for updated bed offers.  Daughter requested that CSW look for bed offers outside of [facility].  CSW sent referrals to [facility], and [facility].  Pasrr number is back [id number].  Updated Clinicals sent to SNFs awaiting bed offers.  TOC to continue to follow patient's progress throughout discharge planning.  Expected Discharge Plan: Skilled Nursing Facility Barriers to Discharge: Continued Medical Work up, SNF Pending bed offer  Expected Discharge Plan and Services In-house Referral: Clinical Social Work   Post Acute Care Choice: Skilled Nursing Facility Living arrangements for the past 2 months: Single Family Home                                       Social Determinants of Health (SDOH) Interventions SDOH Screenings   Food Insecurity: No Food Insecurity (08/11/2023)  Housing: Low Risk  (08/11/2023)  Transportation Needs: No Transportation Needs (08/11/2023)  Utilities: Not At Risk (08/11/2023)  Financial Resource Strain: Low Risk  (06/30/2023)   Received from [facility]  Social Connections: Unknown (12/30/2021)   Received from [facility]  Tobacco Use: Low Risk  (08/11/2023)    Readmission Risk Interventions     No data to display

## 2023-08-15 NOTE — Progress Notes (Signed)
Pt family member is not happy with plan of care for her father. She would like for some communication from the attending in the morning. Her phone # is [phone].

## 2023-08-16 DIAGNOSIS — J1282 Pneumonia due to coronavirus disease 2019: Secondary | ICD-10-CM | POA: Diagnosis not present

## 2023-08-16 DIAGNOSIS — U071 COVID-19: Secondary | ICD-10-CM | POA: Diagnosis not present

## 2023-08-16 LAB — MRSA NEXT GEN BY PCR, NASAL: MRSA by PCR Next Gen: NOT DETECTED

## 2023-08-16 LAB — GLUCOSE, CAPILLARY: Glucose-Capillary: 113 mg/dL — ABNORMAL HIGH (ref 70–99)

## 2023-08-16 LAB — LACTIC ACID, PLASMA: Lactic Acid, Venous: 1.7 mmol/L (ref 0.5–1.9)

## 2023-08-16 MED ORDER — SODIUM CHLORIDE 0.9 % IV BOLUS
500.0000 mL | Freq: Once | INTRAVENOUS | Status: AC | PRN
Start: 2023-08-16 — End: 2023-08-16
  Administered 2023-08-16: 500 mL via INTRAVENOUS

## 2023-08-16 MED ORDER — CHLORHEXIDINE GLUCONATE CLOTH 2 % EX PADS
6.0000 | MEDICATED_PAD | Freq: Every day | CUTANEOUS | Status: DC
  Administered 2023-08-16 – 2023-08-18 (×3): 6 via TOPICAL

## 2023-08-16 MED ORDER — ORAL CARE MOUTH RINSE: 15.0000 mL | OROMUCOSAL | Status: DC | PRN

## 2023-08-16 MED ORDER — CLOTRIMAZOLE 1 % EX CREA
TOPICAL_CREAM | Freq: Two times a day (BID) | CUTANEOUS | Status: DC
  Administered 2023-08-16: 1 via TOPICAL
  Filled 2023-08-16: qty 15

## 2023-08-16 MED ORDER — SODIUM CHLORIDE 0.9 % IV SOLN: INTRAVENOUS | Status: DC

## 2023-08-16 NOTE — Progress Notes (Signed)
Cochlear implant not present with patient or patient's family at the time of admission to the ICU/stepdown unit.

## 2023-08-16 NOTE — Progress Notes (Addendum)
PROGRESS NOTE    Samuel Sloan  BJY:[id number] DOB: [birth year] DOA: 08/11/2023 PCP: Mattie Marlin, DO    Brief Narrative:   Samuel Sloan is a 89 y.o. male with medical history significant for BPH, GERD, hypertension, history of PE not on anticoagulation, recent COVID-19 infection being admitted to the hospital with concern for post COVID-pneumonia.  Apparently, patient was at [facility] this past November, recently tested positive for COVID on December 12.  As per the significant other, patient had been having progressive weakness with cough and difficulty performing ADLs and needs a higher level of care.  Patient is very hard of hearing, and has difficulty participating in conversation, though his significant other [name] who is at the bedside helps with communication.  Workup in the ER, showed mild tachycardia but patient was afebrile.  CBC with WBC at 8.6.  COVID test was positive.  Potassium was low at 3.2.  UA was negative for infection.  Patient being admitted for possible community-acquired pneumonia,  COVID-positive.  Patient appears to have some deterioration in mental function with hypotension today.  Awaiting for skilled nursing facility placement at this time.  Assessment/Plan  Post COVID-pneumonia Patient presented with cough, weakness, chest x-ray abnormalities and subjective fever at home.    Continue IV azithromycin and Rocephin  to complete course.  Plan for 5-day course of antibiotic.  Afebrile on room air.   Concern for dysphagia.  Seen by speech therapy and recommend regular diet thin liquids.  Currently lethargic.   Progressive weakness, debility, deconditioning. At the independent living facility.  PT OT recommend skilled nursing facility placement.  Hypotension lethargic..  Will give 500 mL of IV fluid bolus.  Transferred to stepdown unit.  Will continue to monitor for orthostatic vitals.  Continue IV fluids  Agitation confusion.  Likely hospital induced  delirium/pneumonia, underlying cognitive dysfunction.   on Risperdal from home.  Received 1 dose of Haldol yesterday.   Anxiety and depression-Wellbutrin, Klonopin, Risperdal   Hypertension-HCTZ, losartan   GERD-Protonix   BPH-Flomax   Debility, deconditioning.  Patient likely has overall guarded prognosis.  He was however in the independent living facility before this event.  Will see how he does.  Addendum. Was reported by the nurse that patient was orthostatic while sitting up.  Will give 500 mL bolus followed by infusion overnight.    DVT prophylaxis: enoxaparin (LOVENOX) injection 40 mg Start: 08/11/23 2200 SCDs Start: 08/11/23 1359   Code Status:     Code Status: Limited: Do not attempt resuscitation (DNR) -DNR-LIMITED -Do Not Intubate/DNI   Disposition:  skilled nursing facility likely in 1 to 2 days, orthostatic today.  If continues to deteriorate would benefit from palliative care level of care.  Status is: Inpatient  The patient is inpatient because: IV antibiotic, orthostatic hypotension, delirium, need for rehabilitation at a skilled nursing facility level.   Family Communication:  Spoke with the patient's daughter on the phone on 08/16/23 and updated her about the clinical condition of the patient.  Patient had overall guarded prognosis.  Patient's daughter is open to the idea of palliative care hospice if patient continues to decline.  Consultants:  None  Procedures:  None  Antimicrobials:  Rocephin and Zithromax IV 12/23>  Anti-infectives (From admission, onward)    Start     Dose/Rate Route Frequency Ordered Stop   08/13/23 1230  azithromycin (ZITHROMAX) tablet 500 mg        500 mg Oral Daily 08/13/23 1141     08/12/23 1000  azithromycin (ZITHROMAX) 500 mg in sodium chloride 0.9 % 250 mL IVPB  Status:  Discontinued        500 mg 250 mL/hr over 60 Minutes Intravenous Every 24 hours 08/11/23 1410 08/13/23 1141   08/12/23 1000  cefTRIAXone (ROCEPHIN) 1 g in  sodium chloride 0.9 % 100 mL IVPB        1 g 200 mL/hr over 30 Minutes Intravenous Every 24 hours 08/11/23 1410     08/11/23 1330  cefTRIAXone (ROCEPHIN) 1 g in sodium chloride 0.9 % 100 mL IVPB        1 g 200 mL/hr over 30 Minutes Intravenous  Once 08/11/23 1323 08/11/23 1416   08/11/23 1330  azithromycin (ZITHROMAX) 500 mg in sodium chloride 0.9 % 250 mL IVPB        500 mg 250 mL/hr over 60 Minutes Intravenous  Once 08/11/23 1323 08/11/23 1644      Subjective: Today, patient was seen and examined at bedside.  Patient is poor historian.  Appears sleepy when I saw early morning.  Daughter reports that he has decompensated with mobility in his voice and generalized strength.  Patient's daughter stated that his hearing device was misplaced.  Objective: Vitals:   08/16/23 1231 08/16/23 1310 08/16/23 1315 08/16/23 1400  BP: (!) 87/56 (!) 84/62 92/60 135/74  Pulse:  62 63 62  Resp:  (!) 6  (!) 24  Temp: (!) 96.5 F (35.8 C) 97.6 F (36.4 C)    TempSrc: Axillary Axillary    SpO2: 98% (!) 89%  98%  Weight:      Height:        Intake/Output Summary (Last 24 hours) at 08/16/2023 1414 Last data filed at 08/16/2023 1015 Gross per 24 hour  Intake 540 ml  Output 850 ml  Net -310 ml   Filed Weights   08/11/23 1004  Weight: 78 kg    Physical Examination:  Body mass index is 23.98 kg/m.   General:  Average built, not in obvious distress, hard of hearing, difficulty understand speech, elderly male, appears deconditioned and weak. HENT:   No scleral pallor or icterus noted. Oral mucosa is moist.  Chest:    Diminished breath sounds bilaterally. Coarse breath sounds bilaterally. CVS: S1 &S2 heard. No murmur.  Regular rate and rhythm. Abdomen: Soft, nontender, nondistended.  Bowel sounds are heard.   Extremities: No cyanosis, clubbing or edema.  Peripheral pulses are palpable.   Psych:  disoriented, appears somnolent.  Hard of hearing. CNS:  .  Generalized weakness noted, moving  extremities. Skin: Warm and dry.  No rashes noted.  Data Reviewed:   CBC: Recent Labs  Lab 08/11/23 1006 08/12/23 0548 08/13/23 0522  WBC 8.6 10.1 6.8  HGB 15.3 11.9* 11.5*  HCT 44.5 35.7* 34.6*  MCV 94.9 97.0 96.6  PLT 188 179 179    Basic Metabolic Panel: Recent Labs  Lab 08/11/23 1006 08/12/23 0548 08/13/23 0522  NA 140 135 132*  K 3.2* 3.7 3.6  CL 102 100 97*  CO2 25 25 26   GLUCOSE 96 97 97  BUN 18 21 19   CREATININE 0.96 1.10 1.02  CALCIUM 10.7* 9.1 8.8*  MG  --   --  2.0    Liver Function Tests: Recent Labs  Lab 08/11/23 1006  AST 37  ALT 42  ALKPHOS 65  BILITOT 1.1  PROT 6.6  ALBUMIN 4.0     Radiology Studies: No results found.     LOS: 4 days  Joycelyn Das, MD [facility] Available via Epic secure chat 7am-7pm After these hours, please refer to coverage provider listed on [email or web address] 08/16/2023, 2:14 PM

## 2023-08-16 NOTE — Plan of Care (Signed)
  Problem: Education: Goal: Knowledge of risk factors and measures for prevention of condition will improve Outcome: Progressing   Problem: Coping: Goal: Psychosocial and spiritual needs will be supported Outcome: Progressing   Problem: Respiratory: Goal: Will maintain a patent airway Outcome: Progressing Goal: Complications related to the disease process, condition or treatment will be avoided or minimized Outcome: Progressing   Problem: Education: Goal: Knowledge of General Education information will improve Description: Including pain rating scale, medication(s)/side effects and non-pharmacologic comfort measures Outcome: Progressing   Problem: Health Behavior/Discharge Planning: Goal: Ability to manage health-related needs will improve Outcome: Progressing   Problem: Clinical Measurements: Goal: Ability to maintain clinical measurements within normal limits will improve Outcome: Progressing Goal: Will remain free from infection Outcome: Progressing Goal: Diagnostic test results will improve Outcome: Progressing Goal: Respiratory complications will improve Outcome: Progressing Goal: Cardiovascular complication will be avoided Outcome: Progressing   Problem: Activity: Goal: Risk for activity intolerance will decrease Outcome: Progressing   Problem: Nutrition: Goal: Adequate nutrition will be maintained Outcome: Progressing   Problem: Coping: Goal: Level of anxiety will decrease Outcome: Progressing   Problem: Elimination: Goal: Will not experience complications related to bowel motility Outcome: Progressing Goal: Will not experience complications related to urinary retention Outcome: Progressing   Problem: Pain Management: Goal: General experience of comfort will improve Outcome: Progressing   Problem: Safety: Goal: Ability to remain free from injury will improve Outcome: Progressing   Problem: Skin Integrity: Goal: Risk for impaired skin integrity will  decrease Outcome: Progressing   Problem: Safety: Goal: Non-violent Restraint(s) Outcome: Progressing

## 2023-08-16 NOTE — Significant Event (Signed)
Rapid Response Event Note   Reason for Call :  Hypotension   Initial Focused Assessment:  Patient lethargic, pale, laying flat in bed. Patient aroused to touch and able to answer orientation questions when written on white board. Initial BP was 92/60 with patient laying flat, I raised the patient to a 30 degree angle, patient became pale, periods of apnea, and BP dropped to 79/63. A Nacl bolus was started. This trend continued three times with patient becoming less responsive and hypotensive when placed at a 30 degree angle. MD Rebekah Chesterfield Pokhrel was notified and patient was moved to SDU for closer monitoring.   Interventions:  500 Nacl Bolus  CBG checked   Teresita Madura, RN

## 2023-08-16 NOTE — Progress Notes (Signed)
Physical Therapy Treatment Patient Details Name: Samuel Sloan MRN: [id number] DOB: 1934-04-10 Today's Date: 08/16/2023   History of Present Illness Samuel Sloan is a 89 y.o. male  comes to ED 08/11/23 with weakness , confusion, recently Diagnosed with Covid.PMH:  BPH, GERD, hypertension, history of PE, IVC filter, COPD. TBI, SDH. CT:New L5 anterior superior endplate fracture    PT Comments  Patient able to self assist to sitting on bed edge. Patient ambulated in room with rollator and min assistance. Patient with no complaints.  Daughter present asking about  PT frequency. Advised that PT is available  min of 1 x/ week  but strive  to  provide 2-3 times per week. Encouraged daughter to communicate with nursing to ensure patient is OOB daily.   (Note patient has transferred  to SDU since PT ambulated with patient. )   If plan is discharge home, recommend the following: A little help with walking and/or transfers;A little help with bathing/dressing/bathroom;Assistance with cooking/housework;Assist for transportation   Can travel by private vehicle     No  Equipment Recommendations       Recommendations for Other Services       Precautions / Restrictions Precautions Precautions: Fall Precaution Comments: airborne precautions     Mobility  Bed Mobility   Bed Mobility: Supine to Sit     Supine to sit: Contact guard, Used rails, HOB elevated     General bed mobility comments: extra  time  to move  fully to bed edge pt attempting to scoot but stuck on  bed  pad.    Transfers Overall transfer level: Needs assistance Equipment used: Rollator (4 wheels) Transfers: Sit to/from Stand Sit to Stand: Min assist           General transfer comment: patient locked Rollator  brakes prior to stand, cues to unlock to ambulate    Ambulation/Gait Ambulation/Gait assistance: Min assist Gait Distance (Feet): 30 Feet Assistive device: Rollator (4 wheels) Gait  Pattern/deviations: Step-through pattern, Trunk flexed Gait velocity: decr.     General Gait Details: patient able to ambulate around room and turn in tight space.   Stairs             Wheelchair Mobility     Tilt Bed    Modified Rankin (Stroke Patients Only)       Balance Overall balance assessment: Needs assistance Sitting-balance support: Feet supported, Bilateral upper extremity supported Sitting balance-Leahy Scale: Fair     Standing balance support: During functional activity, Bilateral upper extremity supported, Reliant on assistive device for balance Standing balance-Leahy Scale: Poor Standing balance comment: Pt needs UE support for standing balance                            Cognition Arousal: Alert Behavior During Therapy: Flat affect Overall Cognitive Status: Impaired/Different from baseline                                 General Comments: HOH affects communication, pt relied on board for dat, oriented to hospital. daughter present        Exercises      General Comments        Pertinent Vitals/Pain Pain Assessment Faces Pain Scale: No hurt    Home Living  Prior Function            PT Goals (current goals can now be found in the care plan section) Progress towards PT goals: Progressing toward goals    Frequency    Min 1X/week      PT Plan      Co-evaluation              AM-PAC PT "6 Clicks" Mobility   Outcome Measure  Help needed turning from your back to your side while in a flat bed without using bedrails?: A Little Help needed moving from lying on your back to sitting on the side of a flat bed without using bedrails?: A Little Help needed moving to and from a bed to a chair (including a wheelchair)?: A Little Help needed standing up from a chair using your arms (e.g., wheelchair or bedside chair)?: A Little Help needed to walk in hospital room?: A  Little Help needed climbing 3-5 steps with a railing? : A Lot 6 Click Score: 17    End of Session Equipment Utilized During Treatment: Gait belt Activity Tolerance: Patient tolerated treatment well Patient left: in chair;with call bell/phone within reach;with chair alarm set;with family/visitor present;with nursing/sitter in room Nurse Communication: Mobility status PT Visit Diagnosis: Muscle weakness (generalized) (M62.81);Difficulty in walking, not elsewhere classified (R26.2)     Time: 6063-0160 PT Time Calculation (min) (ACUTE ONLY): 34 min  Charges:    $Gait Training: 23-37 mins PT General Charges $$ ACUTE PT VISIT: 1 Visit                    Blanchard Kelch PT Acute Rehabilitation Services Office [phone] Weekend pager-[phone]  Rada Hay 08/16/2023, 2:23 PM

## 2023-08-16 NOTE — Progress Notes (Signed)
   08/16/23 1758  BiPAP/CPAP/SIPAP  $ Non-Invasive Ventilator  Non-Invasive Vent Set Up  $ Face Mask Large  Yes  BiPAP/CPAP/SIPAP Pt Type Adult  BiPAP/CPAP/SIPAP DREAMSTATIOND  Mask Type Full face mask (pt hangs mouth open may have had full face at home before)  Mask Size Large  EPAP  (cpap auto 5-20)  FiO2 (%)  (2l bled in)  BiPAP/CPAP /SiPAP Vitals  Pulse Rate 74  Resp 19  Bilateral Breath Sounds Diminished;Clear  MEWS Score/Color  MEWS Score 0  MEWS Score Color Samuel Sloan

## 2023-08-16 NOTE — Progress Notes (Signed)
   08/16/23 1949  BiPAP/CPAP/SIPAP  BiPAP/CPAP/SIPAP Pt Type Adult (pt found on cpap)  BiPAP/CPAP/SIPAP DREAMSTATIOND  Mask Type Full face mask  Mask Size Large  Flow Rate 2 lpm  Patient Home Equipment No  Auto Titrate Yes (20/5)  CPAP/SIPAP surface wiped down Yes  BiPAP/CPAP /SiPAP Vitals  Temp 97.9 F (36.6 C)  Pulse Rate 69  Resp 16  BP (!) 142/78  SpO2 98 %  Bilateral Breath Sounds Clear;Diminished  MEWS Score/Color  MEWS Score 0  MEWS Score Color Chilton Si

## 2023-08-16 NOTE — Progress Notes (Signed)
87/56 notified MD at this time.  Pt sitting up in chair at this time

## 2023-08-16 NOTE — Plan of Care (Signed)
  Problem: Education: Goal: Knowledge of risk factors and measures for prevention of condition will improve Outcome: Progressing   Problem: Coping: Goal: Psychosocial and spiritual needs will be supported Outcome: Progressing   Problem: Respiratory: Goal: Will maintain a patent airway Outcome: Progressing Goal: Complications related to the disease process, condition or treatment will be avoided or minimized Outcome: Progressing   Problem: Education: Goal: Knowledge of General Education information will improve Description: Including pain rating scale, medication(s)/side effects and non-pharmacologic comfort measures Outcome: Progressing   Problem: Health Behavior/Discharge Planning: Goal: Ability to manage health-related needs will improve Outcome: Progressing   Problem: Clinical Measurements: Goal: Ability to maintain clinical measurements within normal limits will improve Outcome: Progressing Goal: Will remain free from infection Outcome: Progressing Goal: Diagnostic test results will improve Outcome: Progressing Goal: Respiratory complications will improve Outcome: Progressing Goal: Cardiovascular complication will be avoided Outcome: Progressing   Problem: Activity: Goal: Risk for activity intolerance will decrease Outcome: Progressing   Problem: Nutrition: Goal: Adequate nutrition will be maintained Outcome: Progressing   Problem: Coping: Goal: Level of anxiety will decrease Outcome: Progressing   Problem: Elimination: Goal: Will not experience complications related to bowel motility Outcome: Progressing Goal: Will not experience complications related to urinary retention Outcome: Progressing   Problem: Safety: Goal: Ability to remain free from injury will improve Outcome: Progressing   Problem: Skin Integrity: Goal: Risk for impaired skin integrity will decrease Outcome: Progressing   Problem: Pain Management: Goal: General experience of comfort  will improve Outcome: Progressing   Problem: Safety: Goal: Non-violent Restraint(s) Outcome: Progressing

## 2023-08-17 DIAGNOSIS — J1282 Pneumonia due to coronavirus disease 2019: Secondary | ICD-10-CM | POA: Diagnosis not present

## 2023-08-17 DIAGNOSIS — U071 COVID-19: Secondary | ICD-10-CM | POA: Diagnosis not present

## 2023-08-17 LAB — BASIC METABOLIC PANEL
Anion gap: 9 (ref 5–15)
BUN: 22 mg/dL (ref 8–23)
CO2: 24 mmol/L (ref 22–32)
Calcium: 8.9 mg/dL (ref 8.9–10.3)
Chloride: 103 mmol/L (ref 98–111)
Creatinine, Ser: 1.07 mg/dL (ref 0.61–1.24)
GFR, Estimated: >60 mL/min (ref >60)
Glucose, Bld: 97 mg/dL (ref 70–99)
Potassium: 3.8 mmol/L (ref 3.5–5.1)
Sodium: 136 mmol/L (ref 135–145)

## 2023-08-17 LAB — CBC
HCT: 36.1 % — ABNORMAL LOW (ref 39.0–52.0)
Hemoglobin: 12.2 g/dL — ABNORMAL LOW (ref 13.0–17.0)
MCH: 33.2 pg (ref 26.0–34.0)
MCHC: 33.8 g/dL (ref 30.0–36.0)
MCV: 98.1 fL (ref 80.0–100.0)
Platelets: 191 10*3/uL (ref 150–400)
RBC: 3.68 MIL/uL — ABNORMAL LOW (ref 4.22–5.81)
RDW: 13.1 % (ref 11.5–15.5)
WBC: 5.2 10*3/uL (ref 4.0–10.5)
nRBC: 0 % (ref 0.0–0.2)

## 2023-08-17 LAB — MAGNESIUM: Magnesium: 2.1 mg/dL (ref 1.7–2.4)

## 2023-08-17 LAB — GLUCOSE, CAPILLARY: Glucose-Capillary: 95 mg/dL (ref 70–99)

## 2023-08-17 MED ORDER — HYDROCORTISONE 0.5 % EX CREA
TOPICAL_CREAM | Freq: Two times a day (BID) | CUTANEOUS | Status: DC
Start: 2023-08-17 — End: 2023-08-26
  Administered 2023-08-18 – 2023-08-24 (×4): 1 via TOPICAL
  Filled 2023-08-17 (×2): qty 28.35

## 2023-08-17 MED ORDER — HYDROXYZINE HCL 25 MG PO TABS
25.0000 mg | ORAL_TABLET | Freq: Three times a day (TID) | ORAL | Status: DC | PRN
  Administered 2023-08-17 – 2023-08-24 (×6): 25 mg via ORAL
  Filled 2023-08-17 (×6): qty 1

## 2023-08-17 MED ORDER — CALAMINE EX LOTN
1.0000 | TOPICAL_LOTION | Freq: Two times a day (BID) | CUTANEOUS | Status: DC
  Administered 2023-08-17 – 2023-08-26 (×16): 1 via TOPICAL
  Filled 2023-08-17: qty 177

## 2023-08-17 MED ORDER — BUPROPION HCL ER (XL) 150 MG PO TB24
150.0000 mg | ORAL_TABLET | Freq: Every day | ORAL | Status: DC
  Administered 2023-08-18 – 2023-08-26 (×9): 150 mg via ORAL
  Filled 2023-08-17 (×9): qty 1
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
## 2023-08-17 NOTE — Progress Notes (Signed)
[facility] Progress Note Patient: Samuel Sloan WUJ:[id number] DOB: 1933/09/13 DOA: 08/11/2023  DOS: the patient was seen and examined on 08/17/2023  Brief Hospital Course: PMH of BPH, GERD, HTN, recent COVID-19 infection diagnosed on 12/12 and treated with Paxlovid present to the hospital with complaints of cough and shortness of breath. Being treated for post COVID-pneumonia. Assessment and Plan: Recent COVID-19 infection. Concern for post-COVID pneumonia. Patient presented with cough and weakness. Chest x-ray shows evidence of new infiltrate which were not seen on a chest x-ray performed on 12/12. Based on this patient was started on IV antibiotic with the concern for possible post COVID-pneumonia. Completed 7 days of antibiotic course at the hospital. At present remains on room air. Afebrile. I do not think that the patient actually had a secondary bacterial infection on top of his COVID-19 infection and the findings in the chest x-ray most likely represents COVID-19 infection itself which is regardless treated with Paxlovid. Patient also is more than 10 days out of his initial diagnosis and is also not on any immunosuppressive medication therefore we will be removing him out of the isolation.  Dysphagia. Speech therapy was initially consulted recommended regular thin liquid diet. Patient appears to have some difficulty swallowing with aspiration reported by both RN as well as daughter at bedside. From the middle you seen with soup. Will request MBS. Lethargy appears to be improving.  Deconditioning. Likely in the setting of infection. PT OT recommending SNF. Will monitor.  Dementia. Anxiety and depression. Suspected delirium. Had intermittent confusion and lethargy. Mentation wise patient appears to be stable for now. Will recommend to reduce Wellbutrin. Klonopin and Risperdal has been continued for his pseudoseizure history which I will continue for now. Patient  is also on trazodone and Wellbutrin for his anxiety.  Monitor. On melatonin nightly which I will continue as well.  Tremors. On Mirapex. Will continue the same.  GERD. On PPI.  Hypertension. Now with hypotension. Patient had episodes of hypotension on 12/28 requiring IV fluid bolus and transferred to stepdown unit. But patient is significantly improved for now. Would recommend to discontinue losartan as well as HCTZ for the patient. Would also recommend to discontinue IV fluid given that his blood pressure has improved significantly. Would allow for blood pressure to run a little higher. On Flomax which I will continue.  Goals of care conversation. Appreciate palliative care consultation. Will monitor. Outpatient palliative care referral recommended. Currently DNR.  Prognosis remains guarded.  Rash on his back. Likely contact dermatitis rather than drug allergic reaction. Will treat with topical hydrocortisone cream as well as calamine lotion. Atarax as needed.  Subjective: No acute complaint.  No nausea no vomiting.  Hard of hearing.  Later in the day RN reported some rash on the back with itching.  The  Physical Exam: General: in Mild distress, very diffuse papular nonblanching rash Cardiovascular: S1 and S2 Present, No Murmur Respiratory: Good respiratory effort, Bilateral Air entry present. No Crackles, No wheezes Abdomen: Bowel Sound present, No tenderness Extremities: No edema Neuro: Alert and oriented x3, no new focal deficit  Data Reviewed: I have Reviewed nursing notes, Vitals, and Lab results. Since last encounter, pertinent lab results CBC and BMP   . I have ordered test including BMP  .   Disposition: Status is: Inpatient Remains inpatient appropriate because: Medically stable.  enoxaparin (LOVENOX) injection 40 mg Start: 08/11/23 2200 SCDs Start: 08/11/23 1359   Family Communication: Family at bedside Level of care: Med-Surg   Vitals:  08/17/23 1200  08/17/23 1300 08/17/23 1400 08/17/23 1436  BP: (!) 153/115 (!) 152/60 129/80 130/78  Pulse: 70 61 66 62  Resp: (!) 26 19 19 18   Temp: 97.7 F (36.5 C)   97.6 F (36.4 C)  TempSrc: Oral   Oral  SpO2: 98% 100% 99% 100%  Weight:      Height:         Author: Lynden Oxford, MD 08/17/2023 6:46 PM  Please look on [email or web address] to find out who is on call.

## 2023-08-17 NOTE — Plan of Care (Signed)
°  Problem: Education: Goal: Knowledge of risk factors and measures for prevention of condition will improve Outcome: Progressing   Problem: Coping: Goal: Psychosocial and spiritual needs will be supported Outcome: Progressing   Problem: Respiratory: Goal: Will maintain a patent airway Outcome: Progressing Goal: Complications related to the disease process, condition or treatment will be avoided or minimized Outcome: Progressing   Problem: Education: Goal: Knowledge of General Education information will improve Description: Including pain rating scale, medication(s)/side effects and non-pharmacologic comfort measures Outcome: Progressing   Problem: Health Behavior/Discharge Planning: Goal: Ability to manage health-related needs will improve Outcome: Progressing   Problem: Clinical Measurements: Goal: Ability to maintain clinical measurements within normal limits will improve Outcome: Progressing Goal: Will remain free from infection Outcome: Progressing Goal: Diagnostic test results will improve Outcome: Progressing Goal: Respiratory complications will improve Outcome: Progressing Goal: Cardiovascular complication will be avoided Outcome: Progressing   Problem: Activity: Goal: Risk for activity intolerance will decrease Outcome: Progressing   Problem: Nutrition: Goal: Adequate nutrition will be maintained Outcome: Progressing   Problem: Coping: Goal: Level of anxiety will decrease Outcome: Progressing   Problem: Elimination: Goal: Will not experience complications related to bowel motility Outcome: Progressing Goal: Will not experience complications related to urinary retention Outcome: Progressing   Problem: Pain Management: Goal: General experience of comfort will improve Outcome: Progressing   Problem: Safety: Goal: Ability to remain free from injury will improve Outcome: Progressing   Problem: Skin Integrity: Goal: Risk for impaired skin integrity will  decrease Outcome: Progressing   Problem: Safety: Goal: Non-violent Restraint(s) Outcome: Progressing

## 2023-08-17 NOTE — Progress Notes (Signed)
SLP Cancellation Note  Patient Details Name: Samuel Sloan MRN: [id number] DOB: 06/16/1934   Cancelled treatment:       Reason Eval/Treat Not Completed: Other (comment) SLP spoke with MD regarding this patient. Both in agreement to proceed with MBS next date.  Angela Nevin, MA, CCC-SLP Speech Therapy  08/17/2023, 2:11 PM

## 2023-08-17 NOTE — TOC Progression Note (Signed)
Transition of Care [facility]) - Progression Note    Patient Details  Name: Samuel Sloan MRN: [id number] Date of Birth: [birth year]  Transition of Care [facility]) CM/SW Contact  [name], [place] Phone Number: 08/17/2023, 7:10 PM  Clinical Narrative:     Patient still does not have any definite bed offers.  TOC to follow up on Monday.  Expected Discharge Plan: Skilled Nursing Facility Barriers to Discharge: Continued Medical Work up, SNF Pending bed offer  Expected Discharge Plan and Services In-house Referral: Clinical Social Work   Post Acute Care Choice: Skilled Nursing Facility Living arrangements for the past 2 months: Single Family Home                                       Social Determinants of Health (SDOH) Interventions SDOH Screenings   Food Insecurity: No Food Insecurity (08/11/2023)  Housing: Low Risk  (08/11/2023)  Transportation Needs: No Transportation Needs (08/11/2023)  Utilities: Not At Risk (08/11/2023)  Financial Resource Strain: Low Risk  (06/30/2023)   Received from [facility]  Social Connections: Unknown (12/30/2021)   Received from [facility]  Tobacco Use: Low Risk  (08/11/2023)    Readmission Risk Interventions     No data to display

## 2023-08-18 ENCOUNTER — Inpatient Hospital Stay (HOSPITAL_COMMUNITY): Payer: Medicare HMO

## 2023-08-18 DIAGNOSIS — J1282 Pneumonia due to coronavirus disease 2019: Secondary | ICD-10-CM | POA: Diagnosis not present

## 2023-08-18 DIAGNOSIS — U071 COVID-19: Secondary | ICD-10-CM | POA: Diagnosis not present

## 2023-08-18 NOTE — Progress Notes (Signed)
Daily Progress Note   Patient Name: Samuel Sloan       Date: 08/18/2023 DOB: 1933/10/07  Age: 89 y.o. MRN#: [id number] Attending Physician: Rolly Salter, MD Primary Care Physician: Mattie Marlin, DO Admit Date: 08/11/2023  Reason for Consultation/Follow-up: Establishing goals of care  Subjective: Awake alert, no distress, wearing his glasses, answers few questions appropriately, denies pain. He asks what he is still doing in the hospital - discussed with him about recent ntermittent confusion and lethargy.   Length of Stay: 6  Current Medications: Scheduled Meds:  . acetaminophen  1,000 mg Oral BID  . acidophilus  1 capsule Oral Daily  . aspirin EC  81 mg Oral q AM  . buPROPion  150 mg Oral Daily  . calamine  1 Application Topical BID  . Chlorhexidine Gluconate Cloth  6 each Topical Daily  . clonazePAM  0.5 mg Oral BID  . enoxaparin (LOVENOX) injection  40 mg Subcutaneous Q24H  . finasteride  5 mg Oral Daily  . hydrocortisone cream   Topical BID  . lipase/protease/amylase  72,000 Units Oral TID AC  . melatonin  5 mg Oral QHS  . pantoprazole  40 mg Oral Daily  . pramipexole  1 mg Oral QHS  . risperiDONE  0.5 mg Oral QHS  . tamsulosin  0.4 mg Oral Daily  . traZODone  100 mg Oral QHS    Continuous Infusions:   PRN Meds: acetaminophen **OR** acetaminophen, albuterol, benzonatate, guaiFENesin, hydrOXYzine, loperamide, menthol-cetylpyridinium, ondansetron **OR** ondansetron (ZOFRAN) IV, mouth rinse, phenol  Physical Exam         Awake alert Responds appropriately to questions asked No distress Generalized weakness No edema  Vital Signs: BP 122/78 (BP Location: Right Arm)   Pulse 73   Temp 97.9 F (36.6 C)   Resp 17   Ht 5\' 11"  (1.803 m)   Wt 78 kg   SpO2 98%    BMI 23.98 kg/m  SpO2: SpO2: 98 % O2 Device: O2 Device: Room Air O2 Flow Rate: O2 Flow Rate (L/min): 2 L/min  Intake/output summary:  Intake/Output Summary (Last 24 hours) at 08/18/2023 1439 Last data filed at 08/18/2023 4098 Gross per 24 hour  Intake 900 ml  Output 1950 ml  Net -1050 ml   LBM: Last BM Date :  (PTA) Baseline Weight:  Weight: 78 kg Most recent weight: Weight: 78 kg       Palliative Assessment/Data:      Patient Active Problem List   Diagnosis Date Noted  . Pneumonia 08/12/2023  . Pneumonia due to COVID-19 virus 08/11/2023  . Hypomania (HCC) 09/25/2022  . Seizure-like activity (HCC) 09/23/2022  . History of pulmonary embolism 09/23/2022  . History of subdural hematoma 09/23/2022  . Depression with anxiety 09/23/2022  . Chronic kidney disease, stage 3a (HCC) 09/23/2022  . Generalized anxiety disorder   . Obstructive sleep apnea   . Major depressive disorder   . Diverticulitis   . Bronchitis   . GERD (gastroesophageal reflux disease)   . Hypertension   . RLS (restless legs syndrome) 09/11/2016  . 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  . BiPAP (biphasic positive airway pressure) dependence 06/24/2016  . Daytime somnolence 06/24/2016  . Fatigue 06/24/2016  . Traumatic brain injury 05/29/2016  . Frequent falls 11/14/2015  . Hereditary and idiopathic peripheral neuropathy 11/14/2015  . Concussion 07/14/2015  . Bilateral dry eyes 05/21/2013    Palliative Care Assessment & Plan   Patient Profile:    Assessment:  PMH of BPH, GERD, HTN, recent COVID-19 infection diagnosed on 12/12 and treated with Paxlovid present to the hospital with complaints of cough and shortness of breath. Being treated for post COVID-pneumonia. Recent functional decline Hospital acquired delirium Dysphagia   Recommendations/Plan: Monitor hospital course, monitor PO intake, monitor extent of PT participation.  SNF rehab with palliative versus ALF with Hospice, based  on how the patient does in the next few days.      Code Status:    Code Status Orders  (From admission, onward)           Start     Ordered   08/16/23 1412  Do not attempt resuscitation (DNR)- Limited -Do Not Intubate (DNI)  (Code Status)  Continuous       Question Answer Comment  If pulseless and not breathing No CPR or chest compressions.   In Pre-Arrest Conditions (Patient Is Breathing and Has A Pulse) Do not intubate. Provide all appropriate non-invasive medical interventions. Avoid ICU transfer unless indicated or required.   Consent: Discussion documented in EHR or advanced directives reviewed      08/16/23 1411           Code Status History     Date Active Date Inactive Code Status Order ID Comments User Context   08/11/2023 1359 08/16/2023 1411 Do not attempt resuscitation (DNR) PRE-ARREST INTERVENTIONS DESIRED [id number]  Maryln Gottron, MD ED   01/24/2023 2134 01/25/2023 2012 Full Code [id number]  Maricela Bo, NP ED   09/25/2022 1322 09/27/2022 2221 DNR [id number]  Joylene Draft, NP Inpatient   09/23/2022 2154 09/25/2022 1322 Full Code [id number]  Charlsie Quest, MD ED      Advance Directive Documentation    Flowsheet Row Most Recent Value  Type of Advance Directive Healthcare Power of Attorney  Pre-existing out of facility DNR order (yellow form or pink MOST form) --  "MOST" Form in Place? --       Prognosis:  Unable to determine  Discharge Planning: SNF rehab with palliative versus ALF with Hospice, based on how the patient does in the next few days.   Care plan was discussed with patient.   Thank you for allowing the Palliative Medicine Team to assist in the care of this patient.  MOD MDM.     Greater than 50%  of  this time was spent counseling and coordinating care related to the above assessment and plan.  Rosalin Hawking, MD  Please contact Palliative Medicine Team phone at [phone] for questions and concerns.

## 2023-08-18 NOTE — TOC Progression Note (Signed)
Transition of Care [facility]) - Progression Note   Patient Details  Name: Samuel Sloan MRN: [id number] Date of Birth: February 02, 1934  Transition of Care [facility]) CM/SW Contact  Ewing Schlein, LCSW Phone Number: 08/18/2023, 1:42 PM  Clinical Narrative: Patient continues to not have any bed offers. [facility] is considering a bed offer, but it has not been extended at this time. CSW updated daughter. TOC awaiting update from [facility] regarding possible bed offer.  Expected Discharge Plan: Skilled Nursing Facility Barriers to Discharge: Continued Medical Work up, SNF Pending bed offer  Expected Discharge Plan and Services In-house Referral: Clinical Social Work Post Acute Care Choice: Skilled Nursing Facility Living arrangements for the past 2 months: Single Family Home  Social Determinants of Health (SDOH) Interventions SDOH Screenings   Food Insecurity: No Food Insecurity (08/11/2023)  Housing: Low Risk  (08/11/2023)  Transportation Needs: No Transportation Needs (08/11/2023)  Utilities: Not At Risk (08/11/2023)  Financial Resource Strain: Low Risk  (06/30/2023)   [facility]  Social Connections: Unknown (08/18/2023)  Tobacco Use: Low Risk  (08/11/2023)   Readmission Risk Interventions     No data to display

## 2023-08-18 NOTE — Progress Notes (Signed)
Physical Therapy Treatment Patient Details Name: Samuel Sloan MRN: [id number] DOB: 1933/10/28 Today's Date: 08/18/2023   History of Present Illness Dikembe Ulland is a 89 y.o. male  comes to ED 08/11/23 with weakness , confusion, recently Diagnosed with Covid, now with post covid pna.CT:New L5 anterior superior endplate fracture PMH:  BPH, GERD, hypertension, history of PE, IVC filter, COPD. TBI, SDH.    PT Comments  Pt progressing toward goals. Activity tol, gait distance improving and less assist required overall this session. Pt is cooperative and follows commands consistently. D/c plan remains appropriate for now.    If plan is discharge home, recommend the following: A little help with walking and/or transfers;A little help with bathing/dressing/bathroom;Assistance with cooking/housework;Assist for transportation   Can travel by private vehicle     Yes  Equipment Recommendations  None recommended by PT    Recommendations for Other Services       Precautions / Restrictions Precautions Precautions: Fall Restrictions Weight Bearing Restrictions Per Provider Order: No     Mobility  Bed Mobility Overal bed mobility: Needs Assistance Bed Mobility: Supine to Sit, Sit to Supine     Supine to sit: Contact guard, Used rails, HOB elevated Sit to supine: Contact guard assist   General bed mobility comments: extra  time  to move  fully to bed edge pt attempting to scoot however linens incr difficulty of movement    Transfers Overall transfer level: Needs assistance Equipment used: Rollator (4 wheels) Transfers: Sit to/from Stand Sit to Stand: Contact guard assist, Min assist           General transfer comment: patient locked Rollator  brakes prior to stand, cues to unlock to ambulate. min to CGA to steady and transition to RW    Ambulation/Gait Ambulation/Gait assistance: Contact guard assist Gait Distance (Feet):  (hallway ambulation) Assistive device: Rollator  (4 wheels) Gait Pattern/deviations: Step-through pattern, Trunk flexed       General Gait Details: pt is able to maneuver RW, stop and move rolling chair out of way without LOB. CGA for safety   Stairs             Wheelchair Mobility     Tilt Bed    Modified Rankin (Stroke Patients Only)       Balance   Sitting-balance support: No upper extremity supported, Feet supported Sitting balance-Leahy Scale: Fair     Standing balance support: During functional activity, Bilateral upper extremity supported, Reliant on assistive device for balance Standing balance-Leahy Scale: Poor Standing balance comment: Pt needs UE support for standing balance                            Cognition Arousal: Alert Behavior During Therapy: Flat affect                           Following Commands: Follows one step commands consistently       General Comments: appears grossly  WFL, tells PT he hasn't walked much inthe last 2 weeks which seems accurate. pt is very HOH and that effects comunication/assessment at times        Exercises      General Comments        Pertinent Vitals/Pain Pain Assessment Pain Assessment: No/denies pain    Home Living  Prior Function            PT Goals (current goals can now be found in the care plan section) Acute Rehab PT Goals Patient Stated Goal: to return to his home PT Goal Formulation: With family Time For Goal Achievement: 08/26/23 Potential to Achieve Goals: Fair Progress towards PT goals: Progressing toward goals    Frequency    Min 1X/week      PT Plan      Co-evaluation              AM-PAC PT "6 Clicks" Mobility   Outcome Measure  Help needed turning from your back to your side while in a flat bed without using bedrails?: A Little Help needed moving from lying on your back to sitting on the side of a flat bed without using bedrails?: A Little Help  needed moving to and from a bed to a chair (including a wheelchair)?: A Little Help needed standing up from a chair using your arms (e.g., wheelchair or bedside chair)?: A Little Help needed to walk in hospital room?: A Little Help needed climbing 3-5 steps with a railing? : A Little 6 Click Score: 18    End of Session Equipment Utilized During Treatment: Gait belt Activity Tolerance: Patient tolerated treatment well Patient left: in bed;with call bell/phone within reach;with bed alarm set Nurse Communication: Mobility status PT Visit Diagnosis: Muscle weakness (generalized) (M62.81);Difficulty in walking, not elsewhere classified (R26.2)     Time: 1478-2956 PT Time Calculation (min) (ACUTE ONLY): 18 min  Charges:    $Gait Training: 8-22 mins PT General Charges $$ ACUTE PT VISIT: 1 Visit                     Jailin Moomaw, PT  Acute Rehab Dept ([facility]) [phone]  08/18/2023    [facility] 08/18/2023, 2:06 PM

## 2023-08-18 NOTE — Progress Notes (Signed)
[facility] Progress Note Patient: Samuel Sloan JYN:[id number] DOB: [birth year] DOA: 08/11/2023  DOS: the patient was seen and examined on 08/18/2023  Brief Hospital Course: PMH of BPH, GERD, HTN, recent COVID-19 infection diagnosed on 12/12 and treated with Paxlovid present to the hospital with complaints of cough and shortness of breath. Positive concern for bacterial pneumonia.  Antibiotic course completed.  Now medically stable.  Respiratory status is stable.  Awaiting SNF. Assessment and Plan: Recent COVID-19 infection. Concern for bacterial pneumonia. Patient presented with cough and weakness. Chest x-ray shows evidence of new infiltrate which were not seen on a chest x-ray performed on 12/12. Based on this patient was started on IV antibiotic with the concern for possible post COVID-pneumonia. Completed 7 days of antibiotic course at the hospital. At present remains on room air. I do not think that the patient actually had a secondary bacterial infection on top of his COVID-19 infection and the findings in the chest x-ray most likely represents COVID-19 infection itself which was regardless treated with Paxlovid. Patient also is more than 10 days out of his initial diagnosis and is also not on any immunosuppressive medication therefore we will be removing him out of the isolation.   Dysphagia. Speech therapy was initially consulted recommended regular thin liquid diet. Due to concern for dysphagia on thin liquids, MBS was performed which was negative for any significant aspiration. His lethargy could be contributing to his living situation which also appears to be improving. On regular diet again.   Deconditioning. Likely in the setting of infection. PT OT recommending SNF. Will monitor.   Dementia. Anxiety and depression. Suspected delirium. History of pseudoseizures. Initially had intermittent confusion and lethargy. Mentation wise patient appears to be stable for  now.  Able to follow commands.  No behavioral issues.  No agitation. Wellbutrin dose reduced. Klonopin and Risperdal has been continued for his pseudoseizure history which I will continue for now. Patient is also on trazodone and Wellbutrin for his anxiety. On melatonin nightly which I will continue as well.   Tremors. On Mirapex. Will continue the same.   GERD. On PPI.   Hypertension. Now with hypotension. Patient had episodes of hypotension on 12/28 requiring IV fluid bolus and transferred to stepdown unit. Blood pressure is now normal. Would recommend to discontinue losartan as well as HCTZ for the patient. On Flomax which I will continue. No indication for IV fluid.  Goals of care conversation. Appreciate palliative care consultation. Outpatient palliative care referral recommended. Currently DNR.  Prognosis remains guarded.   Rash on his back. Likely contact dermatitis. Improving with topical hydrocortisone cream as well as calamine lotion. Atarax as needed.   Subjective: No nausea no vomiting no fever no chills.  Physical Exam: General: in Mild distress, improving papular rash Cardiovascular: S1 and S2 Present, No Murmur Respiratory: Good respiratory effort, Bilateral Air entry present. No Crackles, No wheezes Abdomen: Bowel Sound present, No tenderness Extremities: No edema Neuro: Alert and oriented self, no new focal deficit  Data Reviewed: I have Reviewed nursing notes, Vitals, and Lab results.  Disposition: Status is: Inpatient Remains inpatient appropriate because: Awaiting placement.  Unsafe discharge at home.  enoxaparin (LOVENOX) injection 40 mg Start: 08/11/23 2200 SCDs Start: 08/11/23 1359   Family Communication: No one at bedside.  Discussed with daughter on 12/29. Level of care: Med-Surg   Vitals:   08/17/23 1436 08/17/23 2021 08/18/23 0615 08/18/23 1414  BP: 130/78 (!) 145/81 (!) 140/86 122/78  Pulse: 62 76 62 73  Resp: 18 18 18 17   Temp:  97.6 F (36.4 C) 98 F (36.7 C)  97.9 F (36.6 C)  TempSrc: Oral Oral    SpO2: 100% 97% 96% 98%  Weight:      Height:         Author: Lynden Oxford, MD 08/18/2023 6:31 PM  Please look on [email or web address] to find out who is on call.

## 2023-08-18 NOTE — Procedures (Signed)
Modified Barium Swallow Study  Patient Details  Name: Samuel Sloan MRN: [id number] Date of Birth: [birth year]  Today's Date: 08/18/2023  Modified Barium Swallow completed.  Full report located under Chart Review in the Imaging Section.  History of Present Illness Samuel Sloan is a 89 y.o. male with medical history significant for BPH, GERD, hypertension, history of PE not on anticoagulation, recent COVID-19 infection being admitted to the hospital with concern for post COVID-pneumonia.  Apparently, patient was at [facility] this past November, recently tested positive for COVID on December 12.  As per the significant other, patient had been having progressive weakness with cough and difficulty performing ADLs and needs a higher level of care.  Patient is very hard of hearing, and has difficulty participating in conversation, though his significant other Samuel Dandy who is at the bedside helps with communication.  Workup in the ER, showed mild tachycardia but patient was afebrile.  CBC with WBC at 8.6.  COVID test was positive.  Potassium was low at 3.2.  UA was negative for infection.  Patient being admitted for possible community-acquired pneumonia, Covid positive but off isolation as of 08/17/23. SLP evaluated on 08/13/2023 for bedside swallow evaluation and no further intervention indicated at that time. MD re-ordered swallow evaluation secondary to concerns for potential aspiration.   Clinical Impression Patient presents with a mild oropharyngeal dysphagia as per this modified barium swallow study. He exhibited prolonged mastication and delay in anterior to posterior movement of heavier boluses (honey thick, puree, mechanical soft) leading to residuals on base of tongue that cleared with subsequent swallows. Anterior hyoid excursion, laryngeal elevation and laryngeal vestibule closure were all complete. Epiglottic inversion was partial in completion, with epiglottis coming into contact with  posterior pharyngeal wall during swallows, preventing full inversion with thin liquids but with heavier boluses, full inversion was visualilzed. No penetration or aspiration observed with any of the tested barium consistencies, even when patient tilting head back slightly while drinking thin liquid barium to aid in transit of barium tablet. 13 mm barium tablet did become briefly lodged in vallecular sinus however it cleared with bites of puree and sips of thin liquid barium.  Tablet then proceeded to fully transit through pharynx and PES. Bolus transit through PES and upper esophagus was restricted, resulting in pharyngeal residuals remaining s/p initial swallows. With mechanical soft solids, majority of barium residuals was in vallecular sinus, however this started to clear with sips of liquids. SLP recommending patient continue on current diet (Dys 3, thin liquids) and will f/u at least one more time to ensure toleration and complete any education that is needed. Factors that may increase risk of adverse event in presence of aspiration Samuel Sloan & Samuel Sloan 2021): Poor general health and/or compromised immunity;Frail or deconditioned  Swallow Evaluation Recommendations Recommendations: PO diet PO Diet Recommendation: Dysphagia 3 (Mechanical soft);Thin liquids (Level 0) Liquid Administration via: Cup;Straw Medication Administration: Whole meds with puree Supervision: Patient able to self-feed Swallowing strategies  : Slow rate;Small bites/sips Postural changes: Position pt fully upright for meals;Stay upright 30-60 min after meals Oral care recommendations: Oral care BID (2x/day)    Samuel Nevin, MA, CCC-SLP Speech Therapy

## 2023-08-18 NOTE — Plan of Care (Signed)
  Problem: Clinical Measurements: Goal: Respiratory complications will improve Outcome: Progressing   Problem: Coping: Goal: Level of anxiety will decrease Outcome: Progressing   Problem: Safety: Goal: Ability to remain free from injury will improve Outcome: Progressing   

## 2023-08-18 NOTE — Progress Notes (Signed)
Mobility Specialist - Progress Note   08/18/23 1518  Mobility  Activity Ambulated with assistance in hallway  Level of Assistance Standby assist, set-up cues, supervision of patient - no hands on  Assistive Device Front wheel walker;Four wheel walker  Distance Ambulated (ft) 160 ft  Activity Response Tolerated well  Mobility Referral Yes  Mobility visit 1 Mobility  Mobility Specialist Start Time (ACUTE ONLY) 1455  Mobility Specialist Stop Time (ACUTE ONLY) 1515  Mobility Specialist Time Calculation (min) (ACUTE ONLY) 20 min   Pt received in bed and agreeable to mobility. Pt was minA from STS. No complaints during session. Pt to bed after session with all needs met.    [facility]

## 2023-08-19 DIAGNOSIS — U071 COVID-19: Secondary | ICD-10-CM | POA: Diagnosis not present

## 2023-08-19 DIAGNOSIS — Z7189 Other specified counseling: Secondary | ICD-10-CM | POA: Diagnosis not present

## 2023-08-19 DIAGNOSIS — Z515 Encounter for palliative care: Secondary | ICD-10-CM | POA: Diagnosis not present

## 2023-08-19 DIAGNOSIS — J1282 Pneumonia due to coronavirus disease 2019: Secondary | ICD-10-CM | POA: Diagnosis not present

## 2023-08-19 NOTE — Progress Notes (Signed)
  Daily Progress Note   Patient Name: Samuel Sloan       Date: 08/19/2023 DOB: 1933/11/08  Age: 89 y.o. MRN#: [id number] Attending Physician: Samuel Yetta HERO, MD Primary Care Physician: Samuel Sloan, Samuel P, DO Admit Date: 08/11/2023 Length of Stay: 7 days  Reason for Consultation/Follow-up: Establishing goals of care  Subjective:   CC: Patient notes lunch was hard to eat due to meat being tough. Following up regarding complex medical decision making.   Subjective:  Reviewed EMR prior to presenting to bedside.   When presenting to bedside, no family present. Patient sitting up in bed after having finished  lunch. Patient is hard of hearing and hears better from his right ear so spoke to him in this manner. Patient notes that he is trying to eat more. Noted he ate what he could of lunch though the meat was tough so he had difficulty with it getting stuck in his teeth. Patient states he remains motivated to work with PT/OT to pursue potential rehab. Spent time providing emotional support via active listening. Noted PMT would continue to follow along with medical journey.  Discussed care with RN for medical updates as well.   Objective:   Vital Signs:  BP (!) 141/88 (BP Location: Left Arm)   Pulse 65   Temp 97.6 F (36.4 C)   Resp 17   Ht 5' 11 (1.803 m)   Wt 78 kg   SpO2 97%   BMI 23.98 kg/m   Physical Exam: General: NAD, alert, elderly, frail  Cardiovascular: RRR Respiratory: no increased work of breathing noted, not in respiratory distress Neuro: hard of hearing though appropriately engaged in answering questions when can hear them   Imaging:  I personally reviewed recent imaging.   Assessment & Plan:   Assessment: Patient is an 89yo male with a PMHx of BPH, GERD, HTN, and recent COVID-19 infection which received treatment with Paxlovid who was admitted to the hospital on 12/23 for management of cough and shortness of breath. During hospitalization, patient has  received management for post COVID PNA and dysphagia. Palliative medicine team consulted to assist with complex medical decision making.   Recommendations/Plan: # Complex medical decision making/goals of care:  - Patient reporting still motivated to work with PT/OT in attempts to pursue rehab. Allowing time for outcomes. Continue to monitor po intake, PT participation, and hospital course. Had already been discussed with daughter as per EMR review either pursuing SNF rehab with outpatient palliative follow up VS ALF with hospice based on how patient proceeds during hospitalization.   -  Code Status: Limited: Do not attempt resuscitation (DNR) -DNR-LIMITED -Do Not Intubate/DNI   # Psychosocial Support:  -daughter  # Discharge Planning: To Be Determined  Discussed with: patient, RN  Thank you for allowing the palliative care team to participate in the care Lynwood Devonne Tinnie Clayton, DO Palliative Care Provider PMT # [phone]  If patient remains symptomatic despite maximum doses, please call PMT at [phone] between 0700 and 1900. Outside of these hours, please call attending, as PMT does not have night coverage.  *Please note that this is a verbal dictation therefore any spelling or grammatical errors are due to the Dragon Medical One system interpretation.

## 2023-08-19 NOTE — Progress Notes (Signed)
 Mobility Specialist - Progress Note   08/19/23 1444  Mobility  Activity Ambulated with assistance in hallway  Level of Assistance Standby assist, set-up cues, supervision of patient - no hands on  Assistive Device Front wheel walker  Distance Ambulated (ft) 160 ft  Activity Response Tolerated well  Mobility Referral Yes  Mobility visit 1 Mobility  Mobility Specialist Start Time (ACUTE ONLY) 1423  Mobility Specialist Stop Time (ACUTE ONLY) 1443  Mobility Specialist Time Calculation (min) (ACUTE ONLY) 20 min   Pt received in bed and agreeable to mobility. Pt was minA from STS. No complaints during session.Pt to bed after session with all needs met.    [facility]

## 2023-08-19 NOTE — Progress Notes (Signed)
   08/19/23 0100  BiPAP/CPAP/SIPAP  Reason BIPAP/CPAP not in use Non-compliant

## 2023-08-19 NOTE — TOC Progression Note (Signed)
 Transition of Care [facility] - Progression Note   Patient Details  Name: Samuel Sloan MRN: [id number] Date of Birth: April 16, 1934  Transition of Care [facility]) CM/SW Contact  Duwaine GORMAN Aran, LCSW Phone Number: 08/19/2023, 1:32 PM  Clinical Narrative: CSW followed up with Dwayne at [facility] to have the referral reviewed, but [facility] cannot make a bed offer at this time. Daughter requested CSW follow up with [facility] as patient was recently at that SNF. CSW faxed initial referral to [facility] for review. Per Tammy at [facility], the facility can accept the patient has New Year's Day. CSW also followed up with Sierra at [facility] and the facility can make a bed offer. CSW notified daughter [facility] and [facility] can make bed offers. Daughter requested [facility] as it will be closer for family to visit. CSW confirmed bed with Sierra in admissions at [facility]. CSW started insurance authorization on [employer] portal, which is pending. Reference ID # is: [id number]. CSW updated daughter regarding pending insurance approval.  Expected Discharge Plan: Skilled Nursing Facility Barriers to Discharge: Continued Medical Work up, SNF Pending bed offer  Expected Discharge Plan and Services In-house Referral: Clinical Social Work Post Acute Care Choice: Skilled Nursing Facility Living arrangements for the past 2 months: Single Family Home  Social Determinants of Health (SDOH) Interventions SDOH Screenings   Food Insecurity: No Food Insecurity (08/11/2023)  Housing: Low Risk  (08/11/2023)  Transportation Needs: No Transportation Needs (08/11/2023)  Utilities: Not At Risk (08/11/2023)  Financial Resource Strain: Low Risk  (06/30/2023)   Received from [facility]  Social Connections: Unknown (08/18/2023)  Tobacco Use: Low Risk  (08/11/2023)   Readmission Risk Interventions     No data to display

## 2023-08-19 NOTE — Plan of Care (Signed)

## 2023-08-20 NOTE — TOC Progression Note (Signed)
 Transition of Care [facility]) - Progression Note    Patient Details  Name: Samuel Sloan MRN: [id number] Date of Birth: 30-Jun-1934  [facility]) CM/SW Contact  Tawni CHRISTELLA Eva, LCSW Phone Number: 08/20/2023, 12:01 PM  Clinical Narrative:     Pt's insurance auth still pending.  Expected Discharge Plan: Skilled Nursing Facility Barriers to Discharge: Continued Medical Work up, SNF Pending bed offer  Expected Discharge Plan and Services In-house Referral: Clinical Social Work   Post Acute Care Choice: Skilled Nursing Facility Living arrangements for the past 2 months: Single Family Home                                       Social Determinants of Health (SDOH) Interventions SDOH Screenings   Food Insecurity: No Food Insecurity (08/11/2023)  Housing: Low Risk  (08/11/2023)  Transportation Needs: No Transportation Needs (08/11/2023)  Utilities: Not At Risk (08/11/2023)  Financial Resource Strain: Low Risk  (06/30/2023)   Received from [facility]  Social Connections: Unknown (08/18/2023)  Tobacco Use: Low Risk  (08/11/2023)    Readmission Risk Interventions     No data to display

## 2023-08-20 NOTE — Progress Notes (Signed)
 Occupational Therapy Treatment Patient Details Name: Samuel Sloan MRN: [id number] DOB: 23-Oct-1933 Today's Date: 08/20/2023   History of present illness Samuel Sloan is a 89 yr old male  comes to ED 08/11/23 with weakness , confusion, recently diagnosed with Covid, now with post covid PNA. CT:New L5 anterior superior endplate fracture PMH:  BPH, GERD, hypertension, history of PE, IVC filter, COPD. TBI, SDH.   OT comments  The pt was motivated to participate in the session. The session emphasized functional strengthening, progressive of functional activity, and increasing out of bed activity, as such is needed to facilitate progressive ADL performance. He required SBA for supine to sit, then CGA to stand using a RW, for ambulation using RW, and to transfer to the bedside chair. He presented with good effort and participation and he is progressing towards his goals. Continue OT plan of care. Patient will benefit from continued inpatient follow up therapy, <3 hours/day       If plan is discharge home, recommend the following:  A little help with walking and/or transfers;A little help with bathing/dressing/bathroom;Direct supervision/assist for medications management;Supervision due to cognitive status;Direct supervision/assist for financial management   Equipment Recommendations  None recommended by OT    Recommendations for Other Services      Precautions / Restrictions Precautions Precautions: Fall Restrictions Weight Bearing Restrictions Per Provider Order: No       Mobility Bed Mobility Overal bed mobility: Needs Assistance Bed Mobility: Supine to Sit     Supine to sit: Supervision, Used rails, HOB elevated          Transfers Overall transfer level: Needs assistance Equipment used: Rollator (4 wheels) Transfers: Sit to/from Stand Sit to Stand: Contact guard assist                 Balance     Sitting balance-Leahy Scale: Good         Standing balance  comment: CGA with rollator               ADL either performed or assessed with clinical judgement     Vision Baseline Vision/History: 1 Wears glasses            Cognition Arousal: Alert Behavior During Therapy: [facility] for tasks assessed/performed    Following Commands: Follows one step commands consistently       General Comments: He reported a history of concussion with associated memory impairment.                   Pertinent Vitals/ Pain       Pain Assessment Pain Assessment: No/denies pain         Frequency  Min 1X/week        Progress Toward Goals  OT Goals(current goals can now be found in the care plan section)     Acute Rehab OT Goals OT Goal Formulation: With patient Time For Goal Achievement: 08/26/23 Potential to Achieve Goals: Good  Plan         AM-PAC OT 6 Clicks Daily Activity     Outcome Measure   Help from another person eating meals?: None Help from another person taking care of personal grooming?: A Little Help from another person toileting, which includes using toliet, bedpan, or urinal?: A Little Help from another person bathing (including washing, rinsing, drying)?: A Little Help from another person to put on and taking off regular upper body clothing?: A Little Help from another person to put on and taking off regular lower  body clothing?: A Little 6 Click Score: 19    End of Session Equipment Utilized During Treatment: Gait belt;Rollator (4 wheels)  OT Visit Diagnosis: Unsteadiness on feet (R26.81);Muscle weakness (generalized) (M62.81);Other abnormalities of gait and mobility (R26.89)   Activity Tolerance Patient tolerated treatment well   Patient Left in chair;with call bell/phone within reach   Nurse Communication Mobility status        Time: 8452-8392 OT Time Calculation (min): 20 min  Charges: OT General Charges $OT Visit: 1 Visit OT Treatments $Therapeutic Activity: 8-22 mins   Delanna LITTIE Molt,  OTR/L 08/20/2023, 4:17 PM

## 2023-08-20 NOTE — Plan of Care (Signed)
   Problem: Activity: Goal: Risk for activity intolerance will decrease Outcome: Progressing   Problem: Nutrition: Goal: Adequate nutrition will be maintained Outcome: Progressing   Problem: Coping: Goal: Level of anxiety will decrease Outcome: Progressing

## 2023-08-20 NOTE — Progress Notes (Signed)
 [facility] PROGRESS NOTE  Patient: Samuel Sloan FMW:[id number]   PCP: Lazoff, Shawn P, DO DOB: 09-Aug-1934   DOA: 08/11/2023   DOS: 08/20/2023    Subjective: No nausea or vomiting.  No other acute complaint.  Objective:  Vitals:   08/19/23 1306 08/19/23 2303 08/20/23 0648 08/20/23 1322  BP: 120/73 (!) 89/67 (!) 144/92 105/69  Pulse: 67 77 65 79  Resp: 18 18 16 17   Temp: 97.6 F (36.4 C) 97.6 F (36.4 C) (!) 97.4 F (36.3 C) 98.3 F (36.8 C)  TempSrc: Oral   Oral  SpO2: (!) 84% 96% 98% 98%  Weight:      Height:       Clear to auscultation. S1-S2 present. Was not present.  Assessment and plan: Medically stable. Awaiting SNF placement. Presented with pneumonia.  Author: Yetta Blanch, MD [facility] 08/20/2023 6:42 PM   If 7PM-7AM, please contact night-coverage at [email or web address]

## 2023-08-20 NOTE — Progress Notes (Signed)
   08/19/23 2303  BiPAP/CPAP/SIPAP  BiPAP/CPAP/SIPAP DREAMSTATIOND (Pulled, not using!)  BiPAP/CPAP /SiPAP Vitals  Temp 97.6 F (36.4 C)  Pulse Rate 77  Resp 18  BP (!) 89/67  SpO2 96 %  MEWS Score/Color  MEWS Score 1  MEWS Score Color Green

## 2023-08-21 NOTE — Progress Notes (Signed)
 [facility] Progress Note Patient: Samuel Sloan FMW:[id number] DOB: 1934-07-23 DOA: 08/11/2023  DOS: the patient was seen and examined on 08/21/2023  Brief Hospital Course: PMH of BPH, GERD, HTN, recent COVID-19 infection diagnosed on 12/12 and treated with Paxlovid present to the hospital with complaints of cough and shortness of breath. Positive concern for bacterial pneumonia.  Antibiotic course completed.  Now medically stable.  Respiratory status is stable.  Awaiting SNF.  Assessment and Plan: Recent COVID-19 infection. Concern for bacterial pneumonia. Patient presented with cough and weakness. Chest x-ray shows evidence of new infiltrate which were not seen on a chest x-ray performed on 12/12. Based on this patient was started on IV antibiotic with the concern for possible post COVID-pneumonia. Completed 7 days of antibiotic course at the hospital. At present remains on room air. I do not think that the patient actually had a secondary bacterial infection on top of his COVID-19 infection and the findings in the chest x-ray most likely represents COVID-19 infection itself which was regardless treated with Paxlovid. Patient also is more than 10 days out of his initial diagnosis and is also not on any immunosuppressive medication therefore we will be removing him out of the isolation.   Dysphagia. Speech therapy was initially consulted recommended regular thin liquid diet. Due to concern for dysphagia on thin liquids, MBS was performed which was negative for any significant aspiration. His lethargy could be contributing to his living situation which also appears to be improving. On regular diet again.   Deconditioning. Likely in the setting of infection. PT OT recommending SNF. Will monitor.   Dementia. Anxiety and depression. Suspected delirium. History of pseudoseizures. Initially had intermittent confusion and lethargy. Mentation wise patient appears to be stable for  now.  Able to follow commands.  No behavioral issues.  No agitation. Wellbutrin  dose reduced. Klonopin  and Risperdal  has been continued for his pseudoseizure history which I will continue for now. Patient is also on trazodone  and Wellbutrin  for his anxiety. On melatonin nightly which I will continue as well.   Tremors. On Mirapex . Will continue the same.   GERD. On PPI.   Hypertension. Now with hypotension. Patient had episodes of hypotension on 12/28 requiring IV fluid bolus and transferred to stepdown unit. Blood pressure is now normal. Would recommend to discontinue losartan  as well as HCTZ for the patient. On Flomax  which I will continue. No indication for IV fluid.  Goals of care conversation. Appreciate palliative care consultation. Outpatient palliative care referral recommended. Currently DNR.  Prognosis remains guarded.   Rash on his back. Likely contact dermatitis. Improving with topical hydrocortisone  cream as well as calamine lotion. Atarax  as needed.   Subjective: No acute complaint.  No nausea no vomiting.  Physical Exam: General: in Mild distress, No Rash Cardiovascular: S1 and S2 Present, No Murmur Respiratory: Good respiratory effort, Bilateral Air entry present. No Crackles, No wheezes Abdomen: Bowel Sound present, No tenderness Extremities: No edema Neuro: Alert and oriented x3, no new focal deficit  Data Reviewed: I have Reviewed nursing notes, Vitals, and Lab results.  Disposition: Status is: Inpatient Remains inpatient appropriate because: Unsafe discharge awaiting placement.  enoxaparin  (LOVENOX ) injection 40 mg Start: 08/11/23 2200 SCDs Start: 08/11/23 1359   Family Communication: No one at bedside Level of care: Med-Surg   Vitals:   08/20/23 2025 08/21/23 0525 08/21/23 1328 08/21/23 2005  BP: (!) 162/96 122/87 120/80 (!) 147/85  Pulse: 87 69 73 74  Resp: 16 18 15 15   Temp: 98.1 F (  36.7 C) 98.4 F (36.9 C) (!) 97.5 F (36.4 C) 98.4  F (36.9 C)  TempSrc: Oral   Oral  SpO2: 97% 100% 97% 99%  Weight:      Height:         Author: Yetta Blanch, MD 08/21/2023 8:28 PM  Please look on [email or web address] to find out who is on call.

## 2023-08-21 NOTE — Progress Notes (Signed)
   08/21/23 2309  BiPAP/CPAP/SIPAP  Reason BIPAP/CPAP not in use Non-compliant

## 2023-08-21 NOTE — TOC Progression Note (Signed)
 Transition of Care [facility]) - Progression Note   Patient Details  Name: Samuel Sloan MRN: [id number] Date of Birth: 1933-09-01  Transition of Care [facility]) CM/SW Contact  Duwaine GORMAN Aran, LCSW Phone Number: 08/21/2023, 12:10 PM  Clinical Narrative: Insurance authorization is still pending in the [employer] portal. CSW provided update to daughter. Daughter expressed concerns about the delay in not hearing back from insurance. CSW explained the process to daughter as well as the possibility of a peer-to-peer or expedited appeal. Daughter asked if she should reconsider an ALF. CSW explained daughter would need to decide if that is best for the patient or not if SNF is denied as TOC would only be able to set up [facility] in place of rehab.  Expected Discharge Plan: Skilled Nursing Facility Barriers to Discharge: Continued Medical Work up, SNF Pending bed offer  Expected Discharge Plan and Services In-house Referral: Clinical Social Work Post Acute Care Choice: Skilled Nursing Facility Living arrangements for the past 2 months: Single Family Home  Social Determinants of Health (SDOH) Interventions SDOH Screenings   Food Insecurity: No Food Insecurity (08/11/2023)  Housing: Low Risk  (08/11/2023)  Transportation Needs: No Transportation Needs (08/11/2023)  Utilities: Not At Risk (08/11/2023)  Financial Resource Strain: Low Risk  (06/30/2023)   [facility] from [facility]  Social Connections: Unknown (08/18/2023)  Tobacco Use: Low Risk  (08/11/2023)   Readmission Risk Interventions     No data to display

## 2023-08-22 DIAGNOSIS — U071 COVID-19: Secondary | ICD-10-CM | POA: Diagnosis not present

## 2023-08-22 DIAGNOSIS — J1282 Pneumonia due to coronavirus disease 2019: Secondary | ICD-10-CM | POA: Diagnosis not present

## 2023-08-22 DIAGNOSIS — Z515 Encounter for palliative care: Secondary | ICD-10-CM | POA: Diagnosis not present

## 2023-08-22 DIAGNOSIS — F0390 Unspecified dementia without behavioral disturbance: Secondary | ICD-10-CM

## 2023-08-22 DIAGNOSIS — Z7189 Other specified counseling: Secondary | ICD-10-CM | POA: Diagnosis not present

## 2023-08-22 MED ORDER — CLONAZEPAM 0.5 MG PO TABS: 0.5000 mg | ORAL_TABLET | ORAL | 0 refills | Status: DC

## 2023-08-22 MED ORDER — TUBERCULIN PPD 5 UNIT/0.1ML ID SOLN
5.0000 [IU] | Freq: Once | INTRADERMAL | Status: AC
  Administered 2023-08-22: 5 [IU] via INTRADERMAL
  Filled 2023-08-22: qty 0.1

## 2023-08-22 MED ORDER — PRAMIPEXOLE DIHYDROCHLORIDE 1 MG PO TABS: 2.0000 mg | ORAL_TABLET | Freq: Every day | ORAL | 0 refills | Status: DC

## 2023-08-22 MED ORDER — RISPERIDONE 0.5 MG PO TABS: 0.5000 mg | ORAL_TABLET | Freq: Every day | ORAL | 0 refills | Status: DC

## 2023-08-22 NOTE — Progress Notes (Signed)
  Daily Progress Note   Patient Name: Samuel Sloan       Date: 08/22/2023 DOB: 1933-12-17  Age: 89 y.o. MRN#: [id number] Attending Physician: Verdene Purchase, MD Primary Care Physician: Lazoff, Shawn P, DO Admit Date: 08/11/2023 Length of Stay: 10 days  Reason for Consultation/Follow-up: Establishing goals of care  Subjective:   CC: Patient notes his hearing aids are not working so communicated via whiteboard. Following up regarding complex medical decision making.   Subjective:  Reviewed EMR prior to presenting to bedside.  When presenting to bedside, patient pleasantly interacting with his phone.  Again introduced myself as a member of the palliative medicine team.  Patient noted that his hearing aids are not working currently so it is best to communicate with him through whiteboard.  Inquired how patient was feeling at this time and he noted that he is feeling great and wants to get up and walk with physical therapy.  Patient notes he remains motivated to functionally improve.  Spent time providing support reactive listening.  All questions answered at that time.  Patient remains focused on proceeding with rehab.  Discussed care with IDT.  As per discussion patient has been denied rehab placement per their insurance, possibly awaiting appeal of this.  Objective:   Vital Signs:  BP 127/84 (BP Location: Right Arm)   Pulse 66   Temp 97.9 F (36.6 C) (Oral)   Resp 17   Ht 5' 11 (1.803 m)   Wt 78 kg   SpO2 99%   BMI 23.98 kg/m   Physical Exam: General: NAD, alert, elderly, frail  Cardiovascular: RRR Respiratory: no increased work of breathing noted, not in respiratory distress Neuro: hard of hearing though appropriately engaged in answering questions when can hear them   Imaging:  I personally reviewed recent imaging.   Assessment & Plan:   Assessment: Patient is an 89yo male with a PMHx of BPH, GERD, HTN, and recent COVID-19 infection which received treatment with  Paxlovid who was admitted to the hospital on 12/23 for management of cough and shortness of breath. During hospitalization, patient has received management for post COVID PNA and dysphagia. Palliative medicine team consulted to assist with complex medical decision making.   Recommendations/Plan: # Complex medical decision making/goals of care:  - Patient still maintains motivation to functionally improve and hope to participate with physical therapy and rehab.  Had already been discussed with daughter as per EMR review either pursuing SNF rehab with outpatient palliative follow up VS ALF with hospice.  At this time hopeful for rehab and outpatient palliative medicine follow-up.  -  Code Status: Limited: Do not attempt resuscitation (DNR) -DNR-LIMITED -Do Not Intubate/DNI   # Psychosocial Support:  -daughter  # Discharge Planning: To Be Determined  Discussed with: patient, RN, TOC, hospitalist,  Thank you for allowing the palliative care team to participate in the care Lynwood Devonne Tinnie Clayton, DO Palliative Care Provider PMT # [phone]  If patient remains symptomatic despite maximum doses, please call PMT at [phone] between 0700 and 1900. Outside of these hours, please call attending, as PMT does not have night coverage.  *Please note that this is a verbal dictation therefore any spelling or grammatical errors are due to the Dragon Medical One system interpretation.

## 2023-08-22 NOTE — [place] FL2 (Signed)
 [place]  MEDICAID FL2 LEVEL OF CARE FORM     IDENTIFICATION  Patient Name: Samuel Sloan Birthdate: 11/05/1933 Sex: male Admission Date (Current Location): 08/11/2023  [facility] and [place] Number:  Producer, Television/film/video and Address:  [facility],  [address], [place] [address]      Provider Number: [id number]  Attending Physician Name and Address:  Verdene Purchase, MD  Relative Name and Phone Number:  Dequandre, [place] Daughter [phone]    boschult,mary Significant other [phone]    Current Level of Care: Hospital Recommended Level of Care: Assisted Living Facility Prior Approval Number:    Date Approved/Denied:   PASRR Number: [id number]  Discharge Plan:  (ALF)    Current Diagnoses: Patient Active Problem List   Diagnosis Date Noted   Palliative care encounter 08/19/2023   Counseling and coordination of care 08/19/2023   Goals of care, counseling/discussion 08/19/2023   COVID-19 08/19/2023   Pneumonia 08/12/2023   Pneumonia due to COVID-19 virus 08/11/2023   Hypomania (HCC) 09/25/2022   Seizure-like activity (HCC) 09/23/2022   History of pulmonary embolism 09/23/2022   History of subdural hematoma 09/23/2022   Depression with anxiety 09/23/2022   Chronic kidney disease, stage 3a (HCC) 09/23/2022   Generalized anxiety disorder    Obstructive sleep apnea    Major depressive disorder    Diverticulitis    Bronchitis    GERD (gastroesophageal reflux disease)    Hypertension    RLS (restless legs syndrome) 09/11/2016   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   BiPAP (biphasic positive airway pressure) dependence 06/24/2016   Daytime somnolence 06/24/2016   Fatigue 06/24/2016   Traumatic brain injury 05/29/2016   Frequent falls 11/14/2015   Hereditary and idiopathic peripheral neuropathy 11/14/2015   Concussion 07/14/2015   Bilateral dry eyes 05/21/2013    Orientation RESPIRATION BLADDER Height & Weight     Self, Place  Normal  Incontinent Weight: 171 lb 15.3 oz (78 kg) Height:  5' 11 (180.3 cm)  BEHAVIORAL SYMPTOMS/MOOD NEUROLOGICAL BOWEL NUTRITION STATUS      Continent Diet  AMBULATORY STATUS COMMUNICATION OF NEEDS Skin   Limited Assist Verbally Normal                       Personal Care Assistance Level of Assistance  Bathing, Feeding, Dressing Bathing Assistance: Limited assistance Feeding assistance: Independent Dressing Assistance: Limited assistance     Functional Limitations Info  Sight, Hearing, Speech Sight Info: Adequate Hearing Info: Impaired Speech Info: Adequate    SPECIAL CARE FACTORS FREQUENCY  PT (By licensed PT)     PT Frequency: 2x's/week OT Frequency: N/A            Contractures Contractures Info: Not present    Additional Factors Info  Code Status, Allergies, Psychotropic, Isolation Precautions Code Status Info: DNR Allergies Info: Verapamil  Amlodipine  Ibuprofen  Oxycodone  Toprol Xl  (Metoprolol)  Lamotrigine  Psychotropic Info: buPROPion  (WELLBUTRIN  XL) 24 hr tablet 300 mg, clonazePAM  (KLONOPIN ) tablet 0.5 mg,   Isolation Precautions Info: Covid+ as of 07/31/2023 from [facility]     Current Medications (08/22/2023):  This is the current hospital active medication list Current Facility-Administered Medications  Medication Dose Route Frequency Provider Last Rate Last Admin   acetaminophen  (TYLENOL ) tablet 650 mg  650 mg Oral Q6H PRN Zella, Mir M, MD       Or   acetaminophen  (TYLENOL ) suppository 650 mg  650 mg Rectal Q6H PRN Zella Katha HERO, MD  acetaminophen  (TYLENOL ) tablet 1,000 mg  1,000 mg Oral BID Jerrol Agent, MD   1,000 mg at 08/22/23 0939   acidophilus (RISAQUAD) capsule 1 capsule  1 capsule Oral Daily Jerrol Agent, MD   1 capsule at 08/22/23 0941   albuterol  (PROVENTIL ) (2.5 MG/3ML) 0.083% nebulizer solution 2.5 mg  2.5 mg Nebulization Q2H PRN Zella, Mir M, MD       aspirin  EC tablet 81 mg  81 mg Oral q AM Jerrol Agent, MD   81 mg at 08/22/23 0941   benzonatate  (TESSALON ) capsule 100 mg  100 mg Oral TID PRN Zella Katha HERO, MD       buPROPion  (WELLBUTRIN  XL) 24 hr tablet 150 mg  150 mg Oral Daily Patel, Pranav M, MD   150 mg at 08/22/23 0939   calamine lotion 1 Application  1 Application Topical BID Patel, Pranav M, MD   1 Application at 08/22/23 9057   clonazePAM  (KLONOPIN ) tablet 0.5 mg  0.5 mg Oral BID Jerrol Agent, MD   0.5 mg at 08/22/23 9060   enoxaparin  (LOVENOX ) injection 40 mg  40 mg Subcutaneous Q24H Zella, Mir M, MD   40 mg at 08/21/23 2015   finasteride  (PROSCAR ) tablet 5 mg  5 mg Oral Daily Jerrol Agent, MD   5 mg at 08/22/23 0941   guaiFENesin  (ROBITUSSIN) 100 MG/5ML liquid 5 mL  5 mL Oral Q4H PRN Zella Katha HERO, MD       hydrocortisone  cream 0.5 %   Topical BID Patel, Pranav M, MD   Given at 08/22/23 9057   hydrOXYzine  (ATARAX ) tablet 25 mg  25 mg Oral TID PRN Patel, Pranav M, MD   25 mg at 08/20/23 2106   lipase/protease/amylase (CREON ) capsule 72,000 Units  72,000 Units Oral TID THEOPOLIS Jerrol Agent, MD   72,000 Units at 08/22/23 1521   loperamide  (IMODIUM ) capsule 2 mg  2 mg Oral QID PRN Jerrol Agent, MD       melatonin tablet 5 mg  5 mg Oral QHS Jerrol Agent, MD   5 mg at 08/21/23 2016   menthol -cetylpyridinium (CEPACOL) lozenge 3 mg  1 lozenge Oral PRN Zella, Mir M, MD       ondansetron  (ZOFRAN ) tablet 4 mg  4 mg Oral Q6H PRN Zella, Mir M, MD       Or   ondansetron  (ZOFRAN ) injection 4 mg  4 mg Intravenous Q6H PRN Zella, Mir M, MD       Oral care mouth rinse  15 mL Mouth Rinse PRN Pokhrel, Laxman, MD       pantoprazole  (PROTONIX ) EC tablet 40 mg  40 mg Oral Daily Jerrol Agent, MD   40 mg at 08/22/23 0941   phenol (CHLORASEPTIC) mouth spray 1 spray  1 spray Mouth/Throat PRN Jerrol Agent, MD   1 spray at 08/15/23 1804   pramipexole  (MIRAPEX ) tablet 1 mg  1 mg Oral QHS Jerrol Agent, MD   1 mg at 08/21/23 2015   risperiDONE  (RISPERDAL ) tablet 0.5 mg   0.5 mg Oral QHS Jerrol Agent, MD   0.5 mg at 08/21/23 2022   tamsulosin  (FLOMAX ) capsule 0.4 mg  0.4 mg Oral Daily Jerrol Agent, MD   0.4 mg at 08/22/23 0940   traZODone  (DESYREL ) tablet 100 mg  100 mg Oral QHS Jerrol Agent, MD   100 mg at 08/21/23 2015   tuberculin injection 5 Units  5 Units Intradermal Once Krishnan, Gokul, MD         Discharge  Medications: Please see discharge summary for a list of discharge medications.  Relevant Imaging Results:  Relevant Lab Results:   Additional Information SSN [id number]  Duwaine GORMAN Aran, LCSW

## 2023-08-22 NOTE — TOC Progression Note (Signed)
 Transition of Care [facility]) - Progression Note   Patient Details  Name: Samuel Sloan MRN: [id number] Date of Birth: [birth year]  [facility] CM/SW Contact  Duwaine GORMAN Aran, LCSW Phone Number: 08/22/2023, 3:52 PM  Clinical Narrative: CSW notified by Lexine the initial request for rehab was denied and peer to peer information was provided. CSW gave peer to peer information to hospitalist. CSW notified by hospitalist that P2P was also denied. Lexine provided the information for family to complete a fast appeal. CSW provided daughter with number to call [phone]) and clinicals were faxed to [phone]. Reference number is [id number]. Daughter called CSW to provide update that she is having [facility] review the patient again for ALF placement possibly early next week while daughter is awaiting the outcome of the fast appeal. CSW spoke with Dayanna with [facility] and she requested an FL2. FL2 will need to be emailed to [email or web address] for review.  Expected Discharge Plan: Skilled Nursing Facility Barriers to Discharge: Continued Medical Work up, SNF Pending bed offer  Expected Discharge Plan and Services In-house Referral: Clinical Social Work Post Acute Care Choice: Skilled Nursing Facility Living arrangements for the past 2 months: Single Family Home  Social Determinants of Health (SDOH) Interventions SDOH Screenings   Food Insecurity: No Food Insecurity (08/11/2023)  Housing: Low Risk  (08/11/2023)  Transportation Needs: No Transportation Needs (08/11/2023)  Utilities: Not At Risk (08/11/2023)  Financial Resource Strain: Low Risk  (06/30/2023)   Received from [facility]  Social Connections: Unknown (08/18/2023)  Tobacco Use: Low Risk  (08/11/2023)   Readmission Risk Interventions     No data to display

## 2023-08-22 NOTE — Progress Notes (Signed)
 [facility] PROGRESS NOTE   Samuel Sloan FMW:[id number] DOB: May 13, 1934 DOA: 08/11/2023  PCP: Lazoff, Shawn P, DO  Brief History: PMH of BPH, GERD, HTN, recent COVID-19 infection diagnosed on 12/12 and treated with Paxlovid presented to the hospital with complaints of cough and shortness of breath.  Admitted due to concern for secondary bacterial infection.  Consultants: None  Procedures: None    Subjective/Interval History: Patient very hard of hearing.  Denies any pain issues currently.  No shortness of breath.  No nausea or vomiting.    Assessment/Plan:  Recent COVID-19 infection. Concern for bacterial pneumonia. Patient presented with cough and weakness. Chest x-ray shows evidence of new infiltrate which were not seen on a chest x-ray performed on 12/12. Based on this patient was started on IV antibiotic with the concern for possible post COVID-pneumonia. Completed 7 days of antibiotic course at the hospital. Respiratory status has improved.  He was previously treated for COVID-19 with Paxlovid.  Currently saturating normal on room air.   Off of isolation.   Dysphagia. Speech therapy was initially consulted recommended regular thin liquid diet. Due to concern for dysphagia on thin liquids, MBS was performed which was negative for any significant aspiration. His lethargy could be contributing to his living situation which also appears to be improving. On regular diet again.   Deconditioning. Likely in the setting of infection. PT OT recommending SNF.   Dementia. Anxiety and depression. Suspected delirium. History of pseudoseizures. Initially had intermittent confusion and lethargy. Seems to be back to baseline now.  Wellbutrin  dose was decreased.  Klonopin  and Risperdal  being continued.  Also on trazodone .   Tremors. On Mirapex . Will continue the same.   GERD. On PPI.   Essential hypertension. Patient had episodes of hypotension on 12/28  requiring IV fluid bolus and transferred to stepdown unit. Blood pressure is stabilized.  Currently off of losartan  as well as HCTZ.   Goals of care conversation. Appreciate palliative care consultation. Outpatient palliative care referral recommended. Currently DNR.     Rash on his back. Likely contact dermatitis. Improving with topical hydrocortisone  cream as well as calamine lotion. Atarax  as needed.    DVT Prophylaxis: Lovenox  Code Status: DNR Family Communication: No family at bedside Disposition Plan: SNF was recommended.     Medications: Scheduled:  acetaminophen   1,000 mg Oral BID   acidophilus  1 capsule Oral Daily   aspirin  EC  81 mg Oral q AM   buPROPion   150 mg Oral Daily   calamine  1 Application Topical BID   clonazePAM   0.5 mg Oral BID   enoxaparin  (LOVENOX ) injection  40 mg Subcutaneous Q24H   finasteride   5 mg Oral Daily   hydrocortisone  cream   Topical BID   lipase/protease/amylase  72,000 Units Oral TID AC   melatonin  5 mg Oral QHS   pantoprazole   40 mg Oral Daily   pramipexole   1 mg Oral QHS   risperiDONE   0.5 mg Oral QHS   tamsulosin   0.4 mg Oral Daily   traZODone   100 mg Oral QHS   Continuous: PRN:acetaminophen  **OR** acetaminophen , albuterol , benzonatate , guaiFENesin , hydrOXYzine , loperamide , menthol -cetylpyridinium, ondansetron  **OR** ondansetron  (ZOFRAN ) IV, mouth rinse, phenol  Antibiotics: Anti-infectives (From admission, onward)    Start     Dose/Rate Route Frequency Ordered Stop   08/13/23 1230  azithromycin  (ZITHROMAX ) tablet 500 mg  Status:  Discontinued        500 mg Oral Daily 08/13/23 1141 08/17/23 1312   08/12/23 1000  azithromycin  (  ZITHROMAX ) 500 mg in sodium chloride  0.9 % 250 mL IVPB  Status:  Discontinued        500 mg 250 mL/hr over 60 Minutes Intravenous Every 24 hours 08/11/23 1410 08/13/23 1141   08/12/23 1000  cefTRIAXone  (ROCEPHIN ) 1 g in sodium chloride  0.9 % 100 mL IVPB  Status:  Discontinued        1 g 200 mL/hr  over 30 Minutes Intravenous Every 24 hours 08/11/23 1410 08/17/23 1312   08/11/23 1330  cefTRIAXone  (ROCEPHIN ) 1 g in sodium chloride  0.9 % 100 mL IVPB        1 g 200 mL/hr over 30 Minutes Intravenous  Once 08/11/23 1323 08/11/23 1416   08/11/23 1330  azithromycin  (ZITHROMAX ) 500 mg in sodium chloride  0.9 % 250 mL IVPB        500 mg 250 mL/hr over 60 Minutes Intravenous  Once 08/11/23 1323 08/11/23 1644       Objective:  Vital Signs  Vitals:   08/21/23 0525 08/21/23 1328 08/21/23 2005 08/22/23 0410  BP: 122/87 120/80 (!) 147/85 127/84  Pulse: 69 73 74 66  Resp: 18 15 15 17   Temp: 98.4 F (36.9 C) (!) 97.5 F (36.4 C) 98.4 F (36.9 C) 97.9 F (36.6 C)  TempSrc:   Oral Oral  SpO2: 100% 97% 99% 99%  Weight:      Height:        Intake/Output Summary (Last 24 hours) at 08/22/2023 1047 Last data filed at 08/22/2023 0410 Gross per 24 hour  Intake 120 ml  Output 1400 ml  Net -1280 ml   Filed Weights   08/11/23 1004  Weight: 78 kg    General appearance: Awake alert.  In no distress.  Hard of hearing Resp: Normal effort at rest.  Coarse breath sounds bilaterally.  No definite wheezing.  No crackles. Cardio: S1-S2 is normal regular.  No S3-S4.  No rubs murmurs or bruit GI: Abdomen is soft.  Nontender nondistended.  Bowel sounds are present normal.  No masses organomegaly No obvious focal neurological deficits noted.  Lab Results:  Data Reviewed: I have personally reviewed following labs and reports of the imaging studies  CBC: Recent Labs  Lab 08/17/23 0314  WBC 5.2  HGB 12.2*  HCT 36.1*  MCV 98.1  PLT 191    Basic Metabolic Panel: Recent Labs  Lab 08/17/23 0314  NA 136  K 3.8  CL 103  CO2 24  GLUCOSE 97  BUN 22  CREATININE 1.07  CALCIUM 8.9  MG 2.1    GFR: Estimated Creatinine Clearance: 51.8 mL/min (by C-G formula based on SCr of 1.07 mg/dL).  CBG: Recent Labs  Lab 08/16/23 1313 08/17/23 0812  GLUCAP 113* 95     Recent Results (from the  past 240 hours)  MRSA Next Gen by PCR, Nasal     Status: None   Collection Time: 08/16/23  1:56 PM   Specimen: Nasal Mucosa; Nasal Swab  Result Value Ref Range Status   MRSA by PCR Next Gen NOT DETECTED NOT DETECTED Final    Comment: (NOTE) The GeneXpert MRSA Assay (FDA approved for NASAL specimens only), is one component of a comprehensive MRSA colonization surveillance program. It is not intended to diagnose MRSA infection nor to guide or monitor treatment for MRSA infections. Test performance is not FDA approved in patients less than 37 years old. Performed at [facility], [address] [place], [place] [address]       Radiology  Studies: No results found.     LOS: 10 days   Teneshia Hedeen [employer] on [email or web address]  08/22/2023, 10:47 AM

## 2023-08-23 LAB — BASIC METABOLIC PANEL
Anion gap: 7 (ref 5–15)
BUN: 20 mg/dL (ref 8–23)
CO2: 28 mmol/L (ref 22–32)
Calcium: 9.3 mg/dL (ref 8.9–10.3)
Chloride: 99 mmol/L (ref 98–111)
Creatinine, Ser: 1.09 mg/dL (ref 0.61–1.24)
GFR, Estimated: >60 mL/min (ref >60)
Glucose, Bld: 96 mg/dL (ref 70–99)
Potassium: 3.8 mmol/L (ref 3.5–5.1)
Sodium: 134 mmol/L — ABNORMAL LOW (ref 135–145)

## 2023-08-23 NOTE — Plan of Care (Signed)
  Problem: Health Behavior/Discharge Planning: Goal: Ability to manage health-related needs will improve Outcome: Progressing   Problem: Clinical Measurements: Goal: Ability to maintain clinical measurements within normal limits will improve Outcome: Progressing Goal: Will remain free from infection Outcome: Progressing Goal: Diagnostic test results will improve Outcome: Progressing Goal: Respiratory complications will improve Outcome: Progressing   Problem: Activity: Goal: Risk for activity intolerance will decrease Outcome: Progressing   Problem: Coping: Goal: Level of anxiety will decrease Outcome: Progressing   Problem: Safety: Goal: Ability to remain free from injury will improve Outcome: Progressing   Problem: Skin Integrity: Goal: Risk for impaired skin integrity will decrease Outcome: Progressing

## 2023-08-23 NOTE — Plan of Care (Signed)
 ?  Problem: Coping: ?Goal: Level of anxiety will decrease ?Outcome: Progressing ?  ?Problem: Safety: ?Goal: Ability to remain free from injury will improve ?Outcome: Progressing ?  ?

## 2023-08-23 NOTE — Progress Notes (Signed)
   08/23/23 2200  BiPAP/CPAP/SIPAP  Reason BIPAP/CPAP not in use Non-compliant (pt does not have machine in room)

## 2023-08-23 NOTE — Progress Notes (Signed)
 [facility] PROGRESS NOTE   Samuel Sloan FMW:[id number] DOB: 05-22-1934 DOA: 08/11/2023  PCP: Lazoff, Shawn P, DO  Brief History: PMH of BPH, GERD, HTN, recent COVID-19 infection diagnosed on 12/12 and treated with Paxlovid presented to the hospital with complaints of cough and shortness of breath.  Admitted due to concern for secondary bacterial infection.  Consultants: None  Procedures: None    Subjective/Interval History: Patient very hard of hearing.  Fast asleep this morning.  No overnight issues reported by nursing staff.    Assessment/Plan:  Recent COVID-19 infection. Concern for bacterial pneumonia. Patient presented with cough and weakness. Chest x-ray shows evidence of new infiltrate which were not seen on a chest x-ray performed on 12/12. Based on this patient was started on IV antibiotic with the concern for possible post COVID bacterial pneumonia. Completed 7 days of antibiotic course at the hospital. He was previously treated for COVID-19 with Paxlovid.  Currently saturating normal on room air.  Off of isolation. Respiratory status is stable.   Dysphagia. Speech therapy was initially consulted recommended regular thin liquid diet. Due to concern for dysphagia on thin liquids, MBS was performed which was negative for any significant aspiration. His lethargy could be contributing to his living situation which also appears to be improving. On regular diet again.   Deconditioning. Likely in the setting of infection. PT OT recommending SNF.   Dementia. Anxiety and depression. Suspected delirium. History of pseudoseizures. Initially had intermittent confusion and lethargy. Seems to be back to baseline now.   Wellbutrin  dose was decreased.  Klonopin  and Risperdal  being continued.  Also on trazodone .   Tremors. On Mirapex . Will continue the same.   GERD. On PPI.   Essential hypertension. Patient had episodes of hypotension on 12/28 requiring  IV fluid bolus and transferred to stepdown unit. Blood pressure is stabilized.  Currently off of losartan  as well as HCTZ.   Goals of care conversation. Appreciate palliative care consultation. Outpatient palliative care referral recommended. Currently DNR.     Rash on his back. Likely contact dermatitis. Improving with topical hydrocortisone  cream as well as calamine lotion. Atarax  as needed.    DVT Prophylaxis: Lovenox  Code Status: DNR Family Communication: No family at bedside Disposition Plan: Insurance denied SNF for rehab.  Family is appealing.  Also looking into ALF.  PPD was placed on 08/22/23 at 3:20 PM.  Will need to be added on 08/24/2023 at 3:20 PM.     Medications: Scheduled:  acetaminophen   1,000 mg Oral BID   acidophilus  1 capsule Oral Daily   aspirin  EC  81 mg Oral q AM   buPROPion   150 mg Oral Daily   calamine  1 Application Topical BID   clonazePAM   0.5 mg Oral BID   enoxaparin  (LOVENOX ) injection  40 mg Subcutaneous Q24H   finasteride   5 mg Oral Daily   hydrocortisone  cream   Topical BID   lipase/protease/amylase  72,000 Units Oral TID AC   melatonin  5 mg Oral QHS   pantoprazole   40 mg Oral Daily   pramipexole   1 mg Oral QHS   risperiDONE   0.5 mg Oral QHS   tamsulosin   0.4 mg Oral Daily   traZODone   100 mg Oral QHS   tuberculin  5 Units Intradermal Once   Continuous: PRN:acetaminophen  **OR** acetaminophen , albuterol , benzonatate , guaiFENesin , hydrOXYzine , loperamide , menthol -cetylpyridinium, ondansetron  **OR** ondansetron  (ZOFRAN ) IV, mouth rinse, phenol  Antibiotics: Anti-infectives (From admission, onward)    Start     Dose/Rate Route Frequency  Ordered Stop   08/13/23 1230  azithromycin  (ZITHROMAX ) tablet 500 mg  Status:  Discontinued        500 mg Oral Daily 08/13/23 1141 08/17/23 1312   08/12/23 1000  azithromycin  (ZITHROMAX ) 500 mg in sodium chloride  0.9 % 250 mL IVPB  Status:  Discontinued        500 mg 250 mL/hr over 60 Minutes Intravenous  Every 24 hours 08/11/23 1410 08/13/23 1141   08/12/23 1000  cefTRIAXone  (ROCEPHIN ) 1 g in sodium chloride  0.9 % 100 mL IVPB  Status:  Discontinued        1 g 200 mL/hr over 30 Minutes Intravenous Every 24 hours 08/11/23 1410 08/17/23 1312   08/11/23 1330  cefTRIAXone  (ROCEPHIN ) 1 g in sodium chloride  0.9 % 100 mL IVPB        1 g 200 mL/hr over 30 Minutes Intravenous  Once 08/11/23 1323 08/11/23 1416   08/11/23 1330  azithromycin  (ZITHROMAX ) 500 mg in sodium chloride  0.9 % 250 mL IVPB        500 mg 250 mL/hr over 60 Minutes Intravenous  Once 08/11/23 1323 08/11/23 1644       Objective:  Vital Signs  Vitals:   08/22/23 0410 08/22/23 1346 08/22/23 2050 08/23/23 0509  BP: 127/84 136/76 128/78 127/82  Pulse: 66 72 70 66  Resp: 17 16 17 16   Temp: 97.9 F (36.6 C) 97.7 F (36.5 C) 98 F (36.7 C) (!) 97.5 F (36.4 C)  TempSrc: Oral  Oral Oral  SpO2: 99% 96% 95% 95%  Weight:      Height:        Intake/Output Summary (Last 24 hours) at 08/23/2023 0844 Last data filed at 08/23/2023 0600 Gross per 24 hour  Intake 240 ml  Output 1400 ml  Net -1160 ml   Filed Weights   08/11/23 1004  Weight: 78 kg   General appearance: Fast asleep this morning.  In no distress.   Lab Results:  Data Reviewed: I have personally reviewed following labs and reports of the imaging studies  CBC: Recent Labs  Lab 08/17/23 0314  WBC 5.2  HGB 12.2*  HCT 36.1*  MCV 98.1  PLT 191    Basic Metabolic Panel: Recent Labs  Lab 08/17/23 0314 08/23/23 0427  NA 136 134*  K 3.8 3.8  CL 103 99  CO2 24 28  GLUCOSE 97 96  BUN 22 20  CREATININE 1.07 1.09  CALCIUM 8.9 9.3  MG 2.1  --     GFR: Estimated Creatinine Clearance: 50.9 mL/min (by C-G formula based on SCr of 1.09 mg/dL).  CBG: Recent Labs  Lab 08/16/23 1313 08/17/23 0812  GLUCAP 113* 95     Recent Results (from the past 240 hours)  MRSA Next Gen by PCR, Nasal     Status: None   Collection Time: 08/16/23  1:56 PM   Specimen:  Nasal Mucosa; Nasal Swab  Result Value Ref Range Status   MRSA by PCR Next Gen NOT DETECTED NOT DETECTED Final    Comment: (NOTE) The GeneXpert MRSA Assay (FDA approved for NASAL specimens only), is one component of a comprehensive MRSA colonization surveillance program. It is not intended to diagnose MRSA infection nor to guide or monitor treatment for MRSA infections. Test performance is not FDA approved in patients less than 34 years old. Performed at [facility], [address], [place], [place] [address]       Radiology Studies: No results found.   This  is a no charge note.   LOS: 11 days   Carrine [employer] on [email or web address]  08/23/2023, 8:44 AM

## 2023-08-24 DIAGNOSIS — I10 Essential (primary) hypertension: Secondary | ICD-10-CM

## 2023-08-24 DIAGNOSIS — U071 COVID-19: Secondary | ICD-10-CM | POA: Diagnosis not present

## 2023-08-24 DIAGNOSIS — F0390 Unspecified dementia without behavioral disturbance: Secondary | ICD-10-CM | POA: Diagnosis not present

## 2023-08-24 DIAGNOSIS — J1282 Pneumonia due to coronavirus disease 2019: Secondary | ICD-10-CM | POA: Diagnosis not present

## 2023-08-24 NOTE — TOC Progression Note (Signed)
 Transition of Care [facility]) - Progression Note    Patient Details  Name: Samuel Sloan MRN: [id number] Date of Birth: Sep 13, 1933  [facility]) CM/SW Contact  Dalila Camellia [place], [place] Phone Number: 08/24/2023, 7:12 PM  Clinical Narrative:     CSW emailed FL2 to Dayanna at [facility], [email or web address] per her request and daughter's.  TOC still awaiting result of the SNF appeal.  TOC to continue to follow patient's progress throughout discharge planning.   Expected Discharge Plan: Skilled Nursing Facility Barriers to Discharge: Continued Medical Work up, SNF Pending bed offer  Expected Discharge Plan and Services In-house Referral: Clinical Social Work   Post Acute Care Choice: Skilled Nursing Facility Living arrangements for the past 2 months: Single Family Home                                       Social Determinants of Health (SDOH) Interventions SDOH Screenings   Food Insecurity: No Food Insecurity (08/11/2023)  Housing: Low Risk  (08/11/2023)  Transportation Needs: No Transportation Needs (08/11/2023)  Utilities: Not At Risk (08/11/2023)  Financial Resource Strain: Low Risk  (06/30/2023)   Received from [facility]  Social Connections: Unknown (08/18/2023)  Tobacco Use: Low Risk  (08/11/2023)    Readmission Risk Interventions     No data to display

## 2023-08-24 NOTE — Progress Notes (Signed)
 [facility] PROGRESS NOTE   Karem Tomaso FMW:[id number] DOB: 1934-01-23 DOA: 08/11/2023  PCP: Lazoff, Shawn P, DO  Brief History: PMH of BPH, GERD, HTN, recent COVID-19 infection diagnosed on 12/12 and treated with Paxlovid presented to the hospital with complaints of cough and shortness of breath.  Admitted due to concern for secondary bacterial infection.  Consultants: None  Procedures: None    Subjective/Interval History: Patient very hard of hearing.  No complaints offered this morning.  Assessment/Plan:  Recent COVID-19 infection. Concern for bacterial pneumonia. Patient presented with cough and weakness. Chest x-ray shows evidence of new infiltrate which were not seen on a chest x-ray performed on 12/12. Based on this patient was started on IV antibiotic with the concern for possible post COVID bacterial pneumonia. Completed 7 days of antibiotic course at the hospital. He was previously treated for COVID-19 with Paxlovid.  Currently saturating normal on room air.  Off of isolation. Respiratory status is stable.   Dysphagia. Speech therapy was initially consulted recommended regular thin liquid diet. Due to concern for dysphagia on thin liquids, MBS was performed which was negative for any significant aspiration. On regular diet again.   Deconditioning. Likely in the setting of infection. PT OT recommending SNF.   Dementia. Anxiety and depression. Suspected delirium. History of pseudoseizures. Initially had intermittent confusion and lethargy. Seems to be back to baseline now.   Wellbutrin  dose was decreased.  Klonopin  and Risperdal  being continued.  Also on trazodone .   Tremors. On Mirapex . Will continue the same.   GERD. On PPI.   Essential hypertension. Patient had episodes of hypotension on 12/28 requiring IV fluid bolus and transferred to stepdown unit. Blood pressure has stabilized.  Currently off of losartan  as well as HCTZ.   Goals  of care conversation. Appreciate palliative care consultation. Outpatient palliative care referral recommended. Currently DNR.     Rash on his back. Likely contact dermatitis. Improving with topical hydrocortisone  cream as well as calamine lotion. Atarax  as needed.    DVT Prophylaxis: Lovenox  Code Status: DNR Family Communication: No family at bedside Disposition Plan: Insurance denied SNF for rehab.  Family is appealing.  Also looking into ALF.  PPD was placed on 08/22/23 at 3:20 PM.  Will need to be read on 08/24/2023 at 3:20 PM.     Medications: Scheduled:  acetaminophen   1,000 mg Oral BID   acidophilus  1 capsule Oral Daily   aspirin  EC  81 mg Oral q AM   buPROPion   150 mg Oral Daily   calamine  1 Application Topical BID   clonazePAM   0.5 mg Oral BID   enoxaparin  (LOVENOX ) injection  40 mg Subcutaneous Q24H   finasteride   5 mg Oral Daily   hydrocortisone  cream   Topical BID   lipase/protease/amylase  72,000 Units Oral TID AC   melatonin  5 mg Oral QHS   pantoprazole   40 mg Oral Daily   pramipexole   1 mg Oral QHS   risperiDONE   0.5 mg Oral QHS   tamsulosin   0.4 mg Oral Daily   traZODone   100 mg Oral QHS   tuberculin  5 Units Intradermal Once   Continuous: PRN:acetaminophen  **OR** acetaminophen , albuterol , benzonatate , guaiFENesin , hydrOXYzine , loperamide , menthol -cetylpyridinium, ondansetron  **OR** ondansetron  (ZOFRAN ) IV, mouth rinse, phenol  Antibiotics: Anti-infectives (From admission, onward)    Start     Dose/Rate Route Frequency Ordered Stop   08/13/23 1230  azithromycin  (ZITHROMAX ) tablet 500 mg  Status:  Discontinued        500  mg Oral Daily 08/13/23 1141 08/17/23 1312   08/12/23 1000  azithromycin  (ZITHROMAX ) 500 mg in sodium chloride  0.9 % 250 mL IVPB  Status:  Discontinued        500 mg 250 mL/hr over 60 Minutes Intravenous Every 24 hours 08/11/23 1410 08/13/23 1141   08/12/23 1000  cefTRIAXone  (ROCEPHIN ) 1 g in sodium chloride  0.9 % 100 mL IVPB  Status:   Discontinued        1 g 200 mL/hr over 30 Minutes Intravenous Every 24 hours 08/11/23 1410 08/17/23 1312   08/11/23 1330  cefTRIAXone  (ROCEPHIN ) 1 g in sodium chloride  0.9 % 100 mL IVPB        1 g 200 mL/hr over 30 Minutes Intravenous  Once 08/11/23 1323 08/11/23 1416   08/11/23 1330  azithromycin  (ZITHROMAX ) 500 mg in sodium chloride  0.9 % 250 mL IVPB        500 mg 250 mL/hr over 60 Minutes Intravenous  Once 08/11/23 1323 08/11/23 1644       Objective:  Vital Signs  Vitals:   08/23/23 0509 08/23/23 1354 08/23/23 2122 08/24/23 0535  BP: 127/82 115/80 107/62 130/80  Pulse: 66 73 74 76  Resp: 16 18 16 17   Temp: (!) 97.5 F (36.4 C) 98.9 F (37.2 C) 98.5 F (36.9 C) 98.6 F (37 C)  TempSrc: Oral  Oral Oral  SpO2: 95%  97% 94%  Weight:      Height:        Intake/Output Summary (Last 24 hours) at 08/24/2023 0929 Last data filed at 08/24/2023 0800 Gross per 24 hour  Intake 240 ml  Output 920 ml  Net -680 ml   Filed Weights   08/11/23 1004  Weight: 78 kg    General appearance: Awake alert.  In no distress Resp: Clear to auscultation bilaterally.  Normal effort Cardio: S1-S2 is normal regular.  No S3-S4.  No rubs murmurs or bruit GI: Abdomen is soft.  Nontender nondistended.  Bowel sounds are present normal.  No masses organomegaly  Lab Results:  Data Reviewed: I have personally reviewed following labs and reports of the imaging studies  CBC: No results for input(s): WBC, NEUTROABS, HGB, HCT, MCV, PLT in the last 168 hours.   Basic Metabolic Panel: Recent Labs  Lab 08/23/23 0427  NA 134*  K 3.8  CL 99  CO2 28  GLUCOSE 96  BUN 20  CREATININE 1.09  CALCIUM 9.3    GFR: Estimated Creatinine Clearance: 50.9 mL/min (by C-G formula based on SCr of 1.09 mg/dL).  CBG: No results for input(s): GLUCAP in the last 168 hours.    Recent Results (from the past 240 hours)  MRSA Next Gen by PCR, Nasal     Status: None   Collection Time: 08/16/23   1:56 PM   Specimen: Nasal Mucosa; Nasal Swab  Result Value Ref Range Status   MRSA by PCR Next Gen NOT DETECTED NOT DETECTED Final    Comment: (NOTE) The GeneXpert MRSA Assay (FDA approved for NASAL specimens only), is one component of a comprehensive MRSA colonization surveillance program. It is not intended to diagnose MRSA infection nor to guide or monitor treatment for MRSA infections. Test performance is not FDA approved in patients less than 59 years old. Performed at [facility], [address], [place], [place] [address]       Radiology Studies: No results found.     LOS: 12 days   Lanika Colgate  [facility] Pager on  [email or web address]  08/24/2023, 9:29 AM

## 2023-08-24 NOTE — Progress Notes (Signed)
 PT TX NOTE  08/21/23 1100  PT Visit Information  Last PT Received On 08/21/23   Pt is very interactive this session after PT  was able to get bil hearing aides on/working. Pt amb hallway distance with rollator, tol distance well, requiring CGA and cues  for safety.    History of Present Illness Samuel Sloan is a 89 y.o. male  comes to ED 08/11/23 with weakness , confusion, recently Diagnosed with Covid, now with post covid pna.CT:New L5 anterior superior endplate fracture PMH:  BPH, GERD, hypertension, history of PE, IVC filter, COPD. TBI, SDH.  Subjective Data  Patient Stated Goal to return to his home  Precautions  Precautions Fall  Restrictions  Weight Bearing Restrictions Per Provider Order No  Pain Assessment  Pain Assessment Faces  Faces Pain Scale 2  Pain Location back  Pain Descriptors / Indicators Discomfort  Pain Intervention(s) Limited activity within patient's tolerance;Monitored during session;Premedicated before session  Cognition  Arousal Alert  Behavior During Therapy [facility] for tasks assessed/performed  Current Attention Level Sustained  Memory Decreased short-term memory  Following Commands Follows one step commands consistently  General Comments appears grossly  WFL, tells PT he has STM deficits since fall/brain injury years ago. adjusted hearing aide, turned on and placed hearing aide with cochlear attachment. pt noted to have improved hearing afterward  Bed Mobility  Overal bed mobility Needs Assistance  Bed Mobility Supine to Sit  Supine to sit Supervision  General bed mobility comments extra  time  to move  fully to bed edge pt attempting to scoot however linens incr difficulty of movement  Transfers  Overall transfer level Needs assistance  Equipment used Rollator (4 wheels)  Transfers Sit to/from Stand  Sit to Stand Contact guard assist  General transfer comment patient locked Rollator  brakes prior to stand,  CGA to steady and transition to RW   Ambulation/Gait  Ambulation/Gait assistance Contact guard assist  Gait Distance (Feet)  (hallway ambulation) 300'  Assistive device Rollator (4 wheels)  Gait Pattern/deviations Step-through pattern;Trunk flexed  General Gait Details pt is able to maneuver RW with CGA for safety. no overt LOB, cues for trunk extension  Balance  Sitting-balance support No upper extremity supported;Feet supported  Sitting balance-Leahy Scale Fair  Standing balance support During functional activity;Bilateral upper extremity supported;Reliant on assistive device for balance  Standing balance-Leahy Scale Poor  Standing balance comment Pt needs UE support for standing balance  PT - End of Session  Equipment Utilized During Treatment Gait belt  Activity Tolerance Patient tolerated treatment well  Patient left with call bell/phone within reach;in chair;Other (comment) (chair set up with linens from prvious day, no alarm present)  Nurse Communication Mobility status   PT - Assessment/Plan  PT Visit Diagnosis Muscle weakness (generalized) (M62.81);Difficulty in walking, not elsewhere classified (R26.2)  PT Frequency (ACUTE ONLY) Min 1X/week  Follow Up Recommendations Skilled nursing-short term rehab (<3 hours/day) (vs HHPT)  Can patient physically be transported by private vehicle Yes  Patient can return home with the following A little help with walking and/or transfers;A little help with bathing/dressing/bathroom;Assistance with cooking/housework;Assist for transportation  PT equipment None recommended by PT  AM-PAC PT 6 Clicks Mobility Outcome Measure (Version 2)  Help needed turning from your back to your side while in a flat bed without using bedrails? 3  Help needed moving from lying on your back to sitting on the side of a flat bed without using bedrails? 3  Help needed moving to and from  a bed to a chair (including a wheelchair)? 3  Help needed standing up from a chair using your arms (e.g.,  wheelchair or bedside chair)? 3  Help needed to walk in hospital room? 3  Help needed climbing 3-5 steps with a railing?  3  6 Click Score 18  Consider Recommendation of Discharge To: Home with [facility]  Progressive Mobility  What is the highest level of mobility based on the progressive mobility assessment? Level 5 (Walks with assist in room/hall) - Balance while stepping forward/back and can walk in room with assist - Complete  PT Goal Progression  Progress towards PT goals Progressing toward goals  Acute Rehab PT Goals  PT Goal Formulation With family  Time For Goal Achievement 08/26/23  Potential to Achieve Goals Fair  PT Time Calculation  PT Start Time (ACUTE ONLY) 1110  PT Stop Time (ACUTE ONLY) 1135  PT Time Calculation (min) (ACUTE ONLY) 25 min  PT General Charges  $$ ACUTE PT VISIT 1 Visit  PT Treatments  $Gait Training 23-37 mins

## 2023-08-24 NOTE — [place] FL2 (Addendum)
 [place]  MEDICAID FL2 LEVEL OF CARE FORM     IDENTIFICATION  Patient Name: Samuel Sloan Birthdate: [birth year] Sex: male Admission Date (Current Location): 08/11/2023  [facility] [place] Number:  Producer, Television/film/video and Address:  [facility],  [address], [place] [address]      Provider Number: [id number]  Attending Physician Name and Address:  Verdene Purchase, MD  Relative Name and Phone Number:  Webber, Michiels Daughter [phone]    boschult,mary Significant other [phone]    Current Level of Care: Hospital Recommended Level of Care: Assisted Living Facility Prior Approval Number:    Date Approved/Denied:   PASRR Number: [id number]  Discharge Plan: Domiciliary (Rest home) ([facility])    Current Diagnoses: Patient Active Problem List   Diagnosis Date Noted   Palliative care encounter 08/19/2023   Counseling and coordination of care 08/19/2023   Goals of care, counseling/discussion 08/19/2023   COVID-19 08/19/2023   Pneumonia 08/12/2023   Pneumonia due to COVID-19 virus 08/11/2023   Hypomania (HCC) 09/25/2022   Seizure-like activity (HCC) 09/23/2022   History of pulmonary embolism 09/23/2022   History of subdural hematoma 09/23/2022   Depression with anxiety 09/23/2022   Chronic kidney disease, stage 3a (HCC) 09/23/2022   Generalized anxiety disorder    Obstructive sleep apnea    Major depressive disorder    Diverticulitis    Bronchitis    GERD (gastroesophageal reflux disease)    Hypertension    RLS (restless legs syndrome) 09/11/2016   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   BiPAP (biphasic positive airway pressure) dependence 06/24/2016   Daytime somnolence 06/24/2016   Fatigue 06/24/2016   Traumatic brain injury 05/29/2016   Frequent falls 11/14/2015   Hereditary and idiopathic peripheral neuropathy 11/14/2015   Concussion 07/14/2015   Bilateral dry eyes 05/21/2013    Orientation RESPIRATION BLADDER Height &  Weight     Self, Place  Normal Continent Weight: 171 lb 15.3 oz (78 kg) Height:  5' 11 (180.3 cm)  BEHAVIORAL SYMPTOMS/MOOD NEUROLOGICAL BOWEL NUTRITION STATUS      Continent Diet  AMBULATORY STATUS COMMUNICATION OF NEEDS Skin   Supervision Verbally Normal                       Personal Care Assistance Level of Assistance  Bathing, Feeding, Dressing Bathing Assistance: Limited assistance Feeding assistance: Independent Dressing Assistance: Limited assistance     Functional Limitations Info  Sight, Hearing, Speech Sight Info: Adequate Hearing Info: Impaired Speech Info: Adequate    SPECIAL CARE FACTORS FREQUENCY  PT (By licensed PT)     PT Frequency: HH Minimum 1x a week OT Frequency: HH Minimum 1x a week            Contractures Contractures Info: Not present    Additional Factors Info  Code Status, Allergies, Psychotropic, Isolation Precautions Code Status Info: DNR Allergies Info: Verapamil  Amlodipine  Ibuprofen  Oxycodone  Toprol Xl  (Metoprolol)  Lamotrigine  Psychotropic Info: buPROPion  (WELLBUTRIN  XL) 24 hr tablet 300 mg, clonazePAM  (KLONOPIN ) tablet 0.5 mg, risperiDONE  (RISPERDAL ) tablet 0.5 mg, traZODone  (DESYREL ) tablet 100 mg   Isolation Precautions Info: Covid+ as of 07/31/2023 from [facility]      Discharge Medications:   STOP taking these medications     hydrochlorothiazide  12.5 MG tablet Commonly known as: HYDRODIURIL     losartan  100 MG tablet Commonly known as: COZAAR     pantoprazole  40 MG tablet Commonly known as: PROTONIX     Paxlovid (150/100)  10 x 150 MG & 10 x 100MG  Tbpk Generic drug: nirmatrelvir/ritonavir (renal dosing)    QUEtiapine 25 MG tablet Commonly known as: SEROQUEL           TAKE these medications     acetaminophen  650 MG CR tablet Commonly known as: TYLENOL  Take 1,300 mg by mouth in the morning and at bedtime.    aspirin  EC 81 MG tablet Take 1 tablet (81 mg total) by mouth in the morning.     Belsomra  10 MG Tabs Generic drug: Suvorexant  Take 1 tablet (10 mg total) by mouth at bedtime. What changed: how much to take    Benefiber Chew Chew 1-2 tablets by mouth daily as needed (for constipation).    bismuth  subsalicylate 262 MG/15ML suspension Commonly known as: PEPTO BISMOL Take 30 mLs by mouth every 6 (six) hours as needed for indigestion.    buPROPion  150 MG 24 hr tablet Commonly known as: WELLBUTRIN  XL Take 1 tablet (150 mg total) by mouth daily. What changed:  medication strength how much to take when to take this    Chloraseptic Mouth Pain 1.4 % Liqd Generic drug: phenol Use as directed 1 spray in the mouth or throat as needed for throat irritation / pain (related to post-nasal drip).    clonazePAM  0.5 MG tablet Commonly known as: KLONOPIN  Take 1 tablet (0.5 mg total) by mouth See admin instructions. Take 0.5mg  by mouth in the morning and evening. May take an additional dose around midday if needed for anxiety.    clotrimazole  1 % cream Commonly known as: LOTRIMIN  Apply 1 Application topically 2 (two) times daily. Apply to the head of the penis twice a day for 2-4 weeks.    diclofenac  Sodium 1 % Gel Commonly known as: VOLTAREN  Apply 2-4 g topically 4 (four) times daily as needed (pain).    finasteride  5 MG tablet Commonly known as: PROSCAR  Take 1 tablet (5 mg total) by mouth in the morning.    lipase/protease/amylase 63999 UNITS Cpep capsule Commonly known as: Creon  Take 1-2 capsules (36,000-72,000 Units total) by mouth 3 (three) times daily with meals. Take 72,000 units by mouth with meals. Take 36,000 units by mouth with snacks as needed.    loperamide  2 MG tablet Commonly known as: IMODIUM  A-D Take 2 mg by mouth 4 (four) times daily as needed for diarrhea or loose stools.    meclizine  25 MG tablet Commonly known as: ANTIVERT  Take 1 tablet (25 mg total) by mouth 3 (three) times daily as needed for dizziness or nausea.    melatonin 5 MG  Tabs Take 1 tablet (5 mg total) by mouth at bedtime.    meloxicam  7.5 MG tablet Commonly known as: MOBIC  Take 1 tablet (7.5 mg total) by mouth in the morning and at bedtime.    multivitamin tablet Take 1 tablet by mouth in the morning. PureOne    omeprazole  20 MG capsule Commonly known as: PRILOSEC Take 1 capsule (20 mg total) by mouth daily before breakfast.    polyethylene glycol 17 g packet Commonly known as: MIRALAX  / GLYCOLAX  Take 17 g by mouth daily.    pramipexole  1 MG tablet Commonly known as: MIRAPEX  Take 1 tablet (1 mg total) by mouth at bedtime. What changed: how much to take    Probiotic & Acidophilus Ex St Caps Take 1 capsule by mouth in the morning. Takes 60 billion units    risperiDONE  0.5 MG tablet Commonly known as: RISPERDAL  Take 1 tablet (0.5 mg total)  by mouth at bedtime.    tamsulosin  0.4 MG Caps capsule Commonly known as: FLOMAX  Take 1 capsule (0.4 mg total) by mouth in the morning.    traZODone  100 MG tablet Commonly known as: DESYREL  Take 1 tablet (100 mg total) by mouth at bedtime.              Relevant Imaging Results:  Relevant Lab Results:   Additional Information SSN [id number]  Dalila Camellia SAUNDERS, LCSW

## 2023-08-25 DIAGNOSIS — U071 COVID-19: Secondary | ICD-10-CM | POA: Diagnosis not present

## 2023-08-25 DIAGNOSIS — J1282 Pneumonia due to coronavirus disease 2019: Secondary | ICD-10-CM | POA: Diagnosis not present

## 2023-08-25 MED ORDER — BELSOMRA 10 MG PO TABS: 1.0000 | ORAL_TABLET | Freq: Every evening | ORAL | 0 refills | Status: DC

## 2023-08-25 MED ORDER — OMEPRAZOLE 20 MG PO CPDR: 20.0000 mg | DELAYED_RELEASE_CAPSULE | Freq: Every day | ORAL | 0 refills | Status: DC

## 2023-08-25 MED ORDER — ONE-DAILY MULTI VITAMINS PO TABS: 1.0000 | ORAL_TABLET | Freq: Every morning | ORAL | 0 refills | Status: DC

## 2023-08-25 MED ORDER — TAMSULOSIN HCL 0.4 MG PO CAPS: 0.4000 mg | ORAL_CAPSULE | Freq: Every morning | ORAL | 0 refills | Status: DC

## 2023-08-25 MED ORDER — BUPROPION HCL ER (XL) 150 MG PO TB24: 150.0000 mg | ORAL_TABLET | Freq: Every day | ORAL | 0 refills | Status: DC

## 2023-08-25 MED ORDER — PRAMIPEXOLE DIHYDROCHLORIDE 1 MG PO TABS: 1.0000 mg | ORAL_TABLET | Freq: Every day | ORAL | 0 refills | Status: DC

## 2023-08-25 MED ORDER — CLONAZEPAM 0.5 MG PO TABS: 0.5000 mg | ORAL_TABLET | ORAL | 0 refills | Status: DC

## 2023-08-25 MED ORDER — ASPIRIN EC 81 MG PO TBEC: 81.0000 mg | DELAYED_RELEASE_TABLET | Freq: Every morning | ORAL | 11 refills | Status: DC

## 2023-08-25 MED ORDER — CLOTRIMAZOLE 1 % EX CREA: 1.0000 | TOPICAL_CREAM | Freq: Two times a day (BID) | CUTANEOUS | 0 refills | Status: DC

## 2023-08-25 MED ORDER — RISPERIDONE 0.5 MG PO TABS: 0.5000 mg | ORAL_TABLET | Freq: Every day | ORAL | 0 refills | Status: DC

## 2023-08-25 MED ORDER — MECLIZINE HCL 25 MG PO TABS: 25.0000 mg | ORAL_TABLET | Freq: Three times a day (TID) | ORAL | 0 refills | Status: DC | PRN

## 2023-08-25 MED ORDER — FINASTERIDE 5 MG PO TABS: 5.0000 mg | ORAL_TABLET | Freq: Every morning | ORAL | 0 refills | Status: DC

## 2023-08-25 MED ORDER — TRAZODONE HCL 100 MG PO TABS: 100.0000 mg | ORAL_TABLET | Freq: Every day | ORAL | 0 refills | Status: DC

## 2023-08-25 MED ORDER — PANCRELIPASE (LIP-PROT-AMYL) 36000-114000 UNITS PO CPEP: 36000.0000 [IU] | ORAL_CAPSULE | Freq: Three times a day (TID) | ORAL | 0 refills | Status: DC

## 2023-08-25 MED ORDER — MELATONIN 5 MG PO TABS: 5.0000 mg | ORAL_TABLET | Freq: Every day | ORAL | 0 refills | Status: DC

## 2023-08-25 MED ORDER — MELOXICAM 7.5 MG PO TABS: 7.5000 mg | ORAL_TABLET | Freq: Two times a day (BID) | ORAL | 0 refills | Status: DC

## 2023-08-25 NOTE — Discharge Summary (Signed)
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     Destination     [facility] Preferred SNF .   Service: Skilled Nursing Contact information: [address] [place]  [address] [phone]                     TOTAL DISCHARGE TIME: 35 minutes  Jaimya Feliciano Verdene  [facility] Pager on [email or web address]  08/25/2023, 11:02 AM

## 2023-08-25 NOTE — Plan of Care (Signed)
 ?  Problem: Coping: ?Goal: Level of anxiety will decrease ?Outcome: Progressing ?  ?Problem: Safety: ?Goal: Ability to remain free from injury will improve ?Outcome: Progressing ?  ?

## 2023-08-25 NOTE — Progress Notes (Signed)
 Mobility Specialist - Progress Note   08/25/23 1533  Mobility  Activity Ambulated with assistance in hallway  Level of Assistance Standby assist, set-up cues, supervision of patient - no hands on  Assistive Device Four wheel walker  Distance Ambulated (ft) 350 ft  Activity Response Tolerated well  Mobility Referral Yes  Mobility visit 1 Mobility  Mobility Specialist Start Time (ACUTE ONLY) 1459  Mobility Specialist Stop Time (ACUTE ONLY) 1512  Mobility Specialist Time Calculation (min) (ACUTE ONLY) 13 min   Pt received in bed and agreeable to mobility. No complaints during session. Pt to recliner after session with all needs met.    [facility]

## 2023-08-25 NOTE — Plan of Care (Signed)
  Problem: Activity: Goal: Risk for activity intolerance will decrease Outcome: Progressing   Problem: Safety: Goal: Ability to remain free from injury will improve Outcome: Progressing   

## 2023-08-25 NOTE — TOC Progression Note (Signed)
 Transition of Care [facility]) - Progression Note    Patient Details  Name: Samuel Sloan MRN: [id number] Date of Birth: [birth year]  [facility]) CM/SW Contact  Alfonse JONELLE Rex, RN Phone Number: 08/25/2023, 11:40 AM  Clinical Narrative:  Met with pt's dtr [place]) at bedside today, provided general forms for ALF referral to be signed by attending. Reassured [name] that [facility] is following and assisting with process for ALF placement, [facility] voiced understanding. Forms signed by attending, medical documentation (H&P, updated FL2, MD progress notes, therapy notes, specialist consults, etc), faxed to [facility] at  [facility] , fax [phone], per his request. TOC will continue to follow.     Expected Discharge Plan: Skilled Nursing Facility Barriers to Discharge: Continued Medical Work up, SNF Pending bed offer  Expected Discharge Plan and Services In-house Referral: Clinical Social Work   Post Acute Care Choice: Skilled Nursing Facility Living arrangements for the past 2 months: Single Family Home                                       Social Determinants of Health (SDOH) Interventions SDOH Screenings   Food Insecurity: No Food Insecurity (08/11/2023)  Housing: Low Risk  (08/11/2023)  Transportation Needs: No Transportation Needs (08/11/2023)  Utilities: Not At Risk (08/11/2023)  Financial Resource Strain: Low Risk  (06/30/2023)   Received from [facility]  Social Connections: Unknown (08/18/2023)  Tobacco Use: Low Risk  (08/11/2023)    Readmission Risk Interventions     No data to display

## 2023-08-25 NOTE — Progress Notes (Signed)
 Speech Language Pathology Treatment: Dysphagia  Patient Details Name: Samuel Sloan MRN: [id number] DOB: 1934/06/02 Today's Date: 08/25/2023 Time: 8584-8574 SLP Time Calculation (min) (ACUTE ONLY): 10 min  Assessment / Plan / Recommendation Clinical Impression  Patient seen by SLP for skilled treatment focused on dysphagia goals. He was awake, alert, and pleasant. He told SLP that the only difficulty he has had with swallowing was larger pills, usually I choke on cylindrical pills. He indicated that he is able to swallow the larger pills crushed in applesauce. He did also mention some difficulty chewing and with food getting caught in gaps between some of his teeth where he has had dental work/crowns. As patient did not have any incidents of aspiration on MBS completed last week and he is not having significant complaints currently, SLP recommending to continue on Dys 3 (mechanical soft) solids, thin liquids. SLP to s/o at this time.   HPI HPI: Samuel Sloan is a 89 y.o. male with medical history significant for BPH, GERD, hypertension, history of PE not on anticoagulation, recent COVID-19 infection being admitted to the hospital with concern for post COVID-pneumonia.  Apparently, patient was at [facility] this past November, recently tested positive for COVID on December 12.  As per the significant other, patient had been having progressive weakness with cough and difficulty performing ADLs and needs a higher level of care.  Patient is very hard of hearing, and has difficulty participating in conversation, though his significant other Samuel Sloan who is at the bedside helps with communication.  Workup in the ER, showed mild tachycardia but patient was afebrile.  CBC with WBC at 8.6.  COVID test was positive.  Potassium was low at 3.2.  UA was negative for infection.  Patient being admitted for possible community-acquired pneumonia, Covid positive but off isolation as of 08/17/23. SLP evaluated on  08/13/2023 for bedside swallow evaluation and no further intervention indicated at that time. MD re-ordered swallow evaluation secondary to concerns for potential aspiration.      SLP Plan  Discharge SLP treatment due to (comment);All goals met      Recommendations for follow up therapy are one component of a multi-disciplinary discharge planning process, led by the attending physician.  Recommendations may be updated based on patient status, additional functional criteria and insurance authorization.    Recommendations  Diet recommendations: Dysphagia 3 (mechanical soft);Thin liquid Liquids provided via: Cup;Straw Medication Administration: Crushed with puree Supervision: Patient able to self feed Compensations: Slow rate;Small sips/bites Postural Changes and/or Swallow Maneuvers: Seated upright 90 degrees                  Oral care BID   Set up Supervision/Assistance Dysphagia, oropharyngeal phase (R13.12)     Discharge SLP treatment due to (comment);All goals met   Samuel IVAR Blase, MA, CCC-SLP Speech Therapy

## 2023-08-25 NOTE — Progress Notes (Signed)
 Physical Therapy Treatment Patient Details Name: Samuel Sloan MRN: [id number] DOB: [birth year] Today's Date: 08/25/2023   History of Present Illness Samuel Sloan is a 89 y.o. male  comes to ED 08/11/23 with weakness , confusion, recently Diagnosed with Covid, now with post covid pna.CT:New L5 anterior superior endplate fracture PMH:  BPH, GERD, hypertension, history of PE, IVC filter, COPD. TBI, SDH.    PT Comments  Pt progressing toward goals. Cooperative, willing to work with PT, good pt effort during session; pt follows one step commands with consistency. Supervision, safety cues needed throughout session. Pt may benefit from f/u HHPT for higher level strengthening and balance work. Will continue PT POC in acute setting.   If plan is discharge home, recommend the following: A little help with walking and/or transfers;A little help with bathing/dressing/bathroom;Assistance with cooking/housework;Assist for transportation;Supervision due to cognitive status   Can travel by private vehicle     Yes  Equipment Recommendations  None recommended by PT    Recommendations for Other Services       Precautions / Restrictions Precautions Precautions: Fall Restrictions Weight Bearing Restrictions Per Provider Order: No     Mobility  Bed Mobility Overal bed mobility: Needs Assistance Bed Mobility: Supine to Sit     Supine to sit: Supervision     General bed mobility comments: extra  time  to move  fully to bed edge pt attempting to scoot however linens incr difficulty of movement    Transfers Overall transfer level: Needs assistance Equipment used: Rollator (4 wheels) Transfers: Sit to/from Stand Sit to Stand: Supervision           General transfer comment: STS x2; cues for safety,  locking and unlocking  Rollator  brakes appropriately;  supervision for safety    Ambulation/Gait Ambulation/Gait assistance: Contact guard assist, Supervision Gait Distance (Feet): 320  Feet (60' more) Assistive device: Rollator (4 wheels) Gait Pattern/deviations: Step-through pattern, Trunk flexed       General Gait Details: pt is able to maneuver RW with supervision -intermittent CGA  for safety. no overt LOB, cues for trunk, knee and hip extension; brief seated therapeutic rest and then pt able to continue ambulating   Stairs             Wheelchair Mobility     Tilt Bed    Modified Rankin (Stroke Patients Only)       Balance   Sitting-balance support: No upper extremity supported, Feet supported Sitting balance-Leahy Scale: Fair     Standing balance support: During functional activity, Bilateral upper extremity supported, Reliant on assistive device for balance Standing balance-Leahy Scale: Poor Standing balance comment: Pt needs UE support for safe standing balance                            Cognition Arousal: Alert Behavior During Therapy: WFL for tasks assessed/performed Overall Cognitive Status: History of cognitive impairments - at baseline                     Current Attention Level: Sustained Memory: Decreased short-term memory Following Commands: Follows one step commands consistently                Exercises General Exercises - Lower Extremity Ankle Circles/Pumps: AROM, Both, 10 reps Long Arc Quad: AROM, Strengthening, Both, 5 reps    General Comments        Pertinent Vitals/Pain Pain Assessment Pain Assessment: Faces Faces Pain Scale:  Hurts a little bit Pain Location: back Pain Descriptors / Indicators: Discomfort Pain Intervention(s): Monitored during session    Home Living                          Prior Function            PT Goals (current goals can now be found in the care plan section) Acute Rehab PT Goals Patient Stated Goal: to return to his home PT Goal Formulation: With family Time For Goal Achievement: 08/26/23 Potential to Achieve Goals: Good Progress towards PT  goals: Progressing toward goals    Frequency    Min 1X/week      PT Plan      Co-evaluation              AM-PAC PT 6 Clicks Mobility   Outcome Measure  Help needed turning from your back to your side while in a flat bed without using bedrails?: A Little Help needed moving from lying on your back to sitting on the side of a flat bed without using bedrails?: None Help needed moving to and from a bed to a chair (including a wheelchair)?: A Little Help needed standing up from a chair using your arms (e.g., wheelchair or bedside chair)?: None Help needed to walk in hospital room?: A Little Help needed climbing 3-5 steps with a railing? : A Little 6 Click Score: 20    End of Session Equipment Utilized During Treatment: Gait belt Activity Tolerance: Patient tolerated treatment well Patient left: with call bell/phone within reach;in chair;Other (comment) (chair set up with linens from prvious day, no alarm present) Nurse Communication: Mobility status PT Visit Diagnosis: Muscle weakness (generalized) (M62.81);Difficulty in walking, not elsewhere classified (R26.2)     Time: 8894-8874 PT Time Calculation (min) (ACUTE ONLY): 20 min  Charges:    $Gait Training: 8-22 mins PT General Charges $$ ACUTE PT VISIT: 1 Visit                     Brande Uncapher, PT  Acute Rehab Dept [facility]) [phone]  08/25/2023    [facility] 08/25/2023, 11:35 AM

## 2023-08-25 NOTE — Progress Notes (Signed)
   08/25/23 2207  BiPAP/CPAP/SIPAP  Reason BIPAP/CPAP not in use Non-compliant   RN aware.

## 2023-08-26 DIAGNOSIS — U071 COVID-19: Secondary | ICD-10-CM | POA: Diagnosis not present

## 2023-08-26 DIAGNOSIS — J1282 Pneumonia due to coronavirus disease 2019: Secondary | ICD-10-CM | POA: Diagnosis not present

## 2023-08-26 MED ORDER — POLYETHYLENE GLYCOL 3350 17 G PO PACK
17.0000 g | PACK | Freq: Every day | ORAL | Status: DC
  Administered 2023-08-26: 17 g via ORAL
  Filled 2023-08-26: qty 1

## 2023-08-26 MED ORDER — POLYETHYLENE GLYCOL 3350 17 G PO PACK: 17.0000 g | PACK | Freq: Every day | ORAL | 0 refills | Status: DC

## 2023-08-26 MED ORDER — PANCRELIPASE (LIP-PROT-AMYL) 36000-114000 UNITS PO CPEP
72000.0000 [IU] | ORAL_CAPSULE | Freq: Three times a day (TID) | ORAL | Status: DC
  Administered 2023-08-26 (×2): 72000 [IU] via ORAL
  Filled 2023-08-26 (×3): qty 2

## 2023-08-26 NOTE — Progress Notes (Signed)
 Occupational Therapy Treatment Patient Details Name: Samuel Sloan MRN: [id number] DOB: September 07, 1933 Today's Date: 08/26/2023   History of present illness Samuel Sloan is a 89 y.o. male  comes to ED 08/11/23 with weakness , confusion, recently Diagnosed with Covid, now with post covid pna.CT:New L5 anterior superior endplate fracture PMH:  BPH, GERD, hypertension, history of PE, IVC filter, COPD. TBI, SDH.   OT comments  Pt in bed upon therapy arrival and agreeable to participate in OT treatment with focus on functional transfers and assessing progress with therapy goals. Pt has met ~3 therapy goals at this time. He requires cues to initiate tasks and to remain on task due to cognitive deficits. Overall progressing well with OT. Patient will benefit from continued inpatient follow up therapy, <3 hours/day. OT will continue to follow patient acutely.        If plan is discharge home, recommend the following:  A little help with walking and/or transfers;A little help with bathing/dressing/bathroom;Direct supervision/assist for medications management;Supervision due to cognitive status;Direct supervision/assist for financial management   Equipment Recommendations  None recommended by OT       Precautions / Restrictions Precautions Precautions: Fall Restrictions Weight Bearing Restrictions Per Provider Order: No       Mobility Bed Mobility Overal bed mobility: Needs Assistance Bed Mobility: Supine to Sit     Supine to sit: Supervision, HOB elevated, Used rails     General bed mobility comments: VC to initiate task.    Transfers Overall transfer level: Needs assistance Equipment used: Rollator (4 wheels) Transfers: Sit to/from Stand, Bed to chair/wheelchair/BSC Sit to Stand: Supervision     Step pivot transfers: Supervision     General transfer comment: VC provided for safety awareness with rollator management prior to sit to stand along with VC for hand placement.  Required direction and reminders on destination once standing.     Balance Overall balance assessment: Needs assistance Sitting-balance support: No upper extremity supported, Feet supported Sitting balance-Leahy Scale: Fair     Standing balance support: During functional activity, Bilateral upper extremity supported, Reliant on assistive device for balance Standing balance-Leahy Scale: Poor Standing balance comment: Pt needs UE support for safe standing balance     ADL either performed or assessed with clinical judgement   ADL Overall ADL's : Needs assistance/impaired Eating/Feeding: Set up;Sitting                      Cognition Arousal: Alert Behavior During Therapy: WFL for tasks assessed/performed Overall Cognitive Status: History of cognitive impairments - at baseline Area of Impairment: Orientation, Attention, Memory, Following commands, Awareness, Problem solving   General Comments: Pt distracted by hearing aid and verbalized that it wasn't working. Pt attempted to problem solve although unable to determine why hearing was not working even when both batteries were used. VC needed to bring attention back to task (transferring to recliner to eat lunch).              General Comments VSS on RA    Pertinent Vitals/ Pain       Pain Assessment Pain Assessment: Faces Faces Pain Scale: No hurt  Home Living Family/patient expects to be discharged to:: Skilled nursing facility        Home Equipment: Rollator (4 wheels)              Frequency  Min 1X/week        Progress Toward Goals  OT Goals(current goals can now be found in  the care plan section)  Progress towards OT goals: Progressing toward goals  Acute Rehab OT Goals Time For Goal Achievement: 09/09/23 ADL Goals Pt Will Perform Grooming:  (goal met 08/26/23) Pt Will Perform Lower Body Bathing: with supervision;sit to/from stand Pt Will Perform Lower Body Dressing: with supervision;sit to/from  stand Pt Will Transfer to Toilet:  (goal met 08/26/23) Pt Will Perform Toileting - Clothing Manipulation and hygiene: with supervision;sit to/from stand  Plan         AM-PAC OT 6 Clicks Daily Activity     Outcome Measure   Help from another person eating meals?: None Help from another person taking care of personal grooming?: A Little Help from another person toileting, which includes using toliet, bedpan, or urinal?: A Little Help from another person bathing (including washing, rinsing, drying)?: A Little Help from another person to put on and taking off regular upper body clothing?: A Little Help from another person to put on and taking off regular lower body clothing?: A Little 6 Click Score: 19    End of Session Equipment Utilized During Treatment: Gait belt;Rollator (4 wheels)  OT Visit Diagnosis: Unsteadiness on feet (R26.81);Muscle weakness (generalized) (M62.81);Other abnormalities of gait and mobility (R26.89)   Activity Tolerance Patient tolerated treatment well   Patient Left in chair;with call bell/phone within reach;with chair alarm set           Time: 8875-8843 OT Time Calculation (min): 32 min  Charges: OT General Charges $OT Visit: 1 Visit OT Treatments $Self Care/Home Management : 23-37 mins  Leita Howell, OTR/L,CBIS  Supplemental OT - [facility] and [facility] Secure Chat Preferred    Joaquina Nissen, Leita BIRCH 08/26/2023, 1:21 PM

## 2023-08-26 NOTE — TOC Transition Note (Addendum)
 Transition of Care [facility]) - Discharge Note   Patient Details  Name: Samuel Sloan MRN: [id number] Date of Birth: [birth year]  Transition of Care [facility]) CM/SW Contact:  Alfonse JONELLE Rex, RN Phone Number: 08/26/2023, 4:31 PM   Clinical Narrative:  DC order to ALF, [facility], wiith  [facility], confirmed dtr has delivered pt's belongings and pt is ready for transfer today. Call received from [facility], pt' dtr, confirmed she will transport pt to facility. No further TOC needs.     Final next level of care: Skilled Nursing Facility Barriers to Discharge: Barriers Resolved   Patient Goals and CMS Choice Patient states their goals for this hospitalization and ongoing recovery are:: To go to SNF for rehab, then return back home. CMS Medicare.gov Compare Post Acute Care list provided to:: Patient Represenative (must comment) (Spoke to patient's daughter via phone.) Choice offered to / list presented to : Adult Children [place] ownership interest in [facility].provided to:: Adult Children    Discharge Placement                       Discharge Plan and Services Additional resources added to the After Visit Summary for   In-house Referral: Clinical Social Work   Post Acute Care Choice: Skilled Nursing Facility                               Social Drivers of Health (SDOH) Interventions SDOH Screenings   Food Insecurity: No Food Insecurity (08/11/2023)  Housing: Low Risk  (08/11/2023)  Transportation Needs: No Transportation Needs (08/11/2023)  Utilities: Not At Risk (08/11/2023)  Financial Resource Strain: Low Risk  (06/30/2023)   [facility]  Social Connections: Unknown (08/18/2023)  Tobacco Use: Low Risk  (08/11/2023)     Readmission Risk Interventions     No data to display

## 2023-08-26 NOTE — Progress Notes (Signed)
 Patient's TB PPD test was read 08/24/2023 with no reaction, negative induration. Result reviewed with Johann Capers NP and Salli Quarry RN.    Result: TB Test Negative

## 2023-08-26 NOTE — Discharge Summary (Signed)
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   traZODone  100 MG tablet Commonly known as: DESYREL  Take 1 tablet (100 mg total) by mouth at bedtime.           Contact information for after-discharge care     Destination     [facility]erred SN[facility] .   Service: Skilled Nursing Contact information: [address] [place]  [address] [phone]                     TOTAL DISCHARGE TIME: 35 minutes  Breon Diss Verdene  [facility] Pager on [email or web address]  08/26/2023, 9:31 AM

## 2023-08-26 NOTE — TOC Progression Note (Signed)
 Transition of Care [facility]) - Progression Note    Patient Details  Name: Tayjon Halladay MRN: [id number] Date of Birth: Oct 30, 1933  [facility] CM/SW Contact  Dalila Camellia [place], [place] Phone Number: 08/26/2023, 7:04 PM  Clinical Narrative:     CSW emailed updated ALF FL2, DC summary, and FL2 to [employer] at [email or web address] from [facility].  Expected Discharge Plan: Skilled Nursing Facility Barriers to Discharge: Barriers Resolved  Expected Discharge Plan and Services In-house Referral: Clinical Social Work   Post Acute Care Choice: Skilled Nursing Facility Living arrangements for the past 2 months: Single Family Home Expected Discharge Date: 08/26/23                                     Social Determinants of Health (SDOH) Interventions SDOH Screenings   Food Insecurity: No Food Insecurity (08/11/2023)  Housing: Low Risk  (08/11/2023)  Transportation Needs: No Transportation Needs (08/11/2023)  Utilities: Not At Risk (08/11/2023)  Financial Resource Strain: Low Risk  (06/30/2023)   Received from [facility]  Social Connections: Unknown (08/18/2023)  Tobacco Use: Low Risk  (08/11/2023)    Readmission Risk Interventions     No data to display

## 2023-08-26 NOTE — Plan of Care (Signed)
  Problem: Clinical Measurements: Goal: Ability to maintain clinical measurements within normal limits will improve Outcome: Progressing Goal: Diagnostic test results will improve Outcome: Progressing   Problem: Coping: Goal: Level of anxiety will decrease Outcome: Progressing   

## 2023-11-23 ENCOUNTER — Emergency Department (HOSPITAL_COMMUNITY)

## 2023-11-23 ENCOUNTER — Emergency Department (HOSPITAL_COMMUNITY)
Admission: EM | Admit: 2023-11-23 | Discharge: 2023-11-24 | Disposition: A | Discharge status: Home / Self Care | Attending: Emergency Medicine | Admitting: Emergency Medicine

## 2023-11-23 DIAGNOSIS — R410 Disorientation, unspecified: Secondary | ICD-10-CM | POA: Insufficient documentation

## 2023-11-23 DIAGNOSIS — E860 Dehydration: Secondary | ICD-10-CM | POA: Diagnosis not present

## 2023-11-23 DIAGNOSIS — G4733 Obstructive sleep apnea (adult) (pediatric): Secondary | ICD-10-CM | POA: Diagnosis not present

## 2023-11-23 DIAGNOSIS — I10 Essential (primary) hypertension: Secondary | ICD-10-CM | POA: Diagnosis not present

## 2023-11-23 DIAGNOSIS — R001 Bradycardia, unspecified: Secondary | ICD-10-CM | POA: Insufficient documentation

## 2023-11-23 DIAGNOSIS — R471 Dysarthria and anarthria: Secondary | ICD-10-CM | POA: Insufficient documentation

## 2023-11-23 DIAGNOSIS — N289 Disorder of kidney and ureter, unspecified: Secondary | ICD-10-CM | POA: Diagnosis not present

## 2023-11-23 DIAGNOSIS — F0390 Unspecified dementia without behavioral disturbance: Secondary | ICD-10-CM | POA: Diagnosis not present

## 2023-11-23 DIAGNOSIS — G2581 Restless legs syndrome: Secondary | ICD-10-CM | POA: Insufficient documentation

## 2023-11-23 DIAGNOSIS — I160 Hypertensive urgency: Secondary | ICD-10-CM

## 2023-11-23 DIAGNOSIS — R531 Weakness: Secondary | ICD-10-CM | POA: Insufficient documentation

## 2023-11-23 DIAGNOSIS — R4781 Slurred speech: Secondary | ICD-10-CM

## 2023-11-23 DIAGNOSIS — Z7982 Long term (current) use of aspirin: Secondary | ICD-10-CM | POA: Insufficient documentation

## 2023-11-23 DIAGNOSIS — N179 Acute kidney failure, unspecified: Secondary | ICD-10-CM

## 2023-11-23 DIAGNOSIS — R404 Transient alteration of awareness: Secondary | ICD-10-CM

## 2023-11-23 LAB — CBG MONITORING, ED: Glucose-Capillary: 104 mg/dL — ABNORMAL HIGH (ref 70–99)

## 2023-11-24 ENCOUNTER — Emergency Department (HOSPITAL_COMMUNITY)

## 2023-11-24 ENCOUNTER — Other Ambulatory Visit (HOSPITAL_COMMUNITY)

## 2023-11-24 ENCOUNTER — Other Ambulatory Visit: Payer: Self-pay

## 2023-11-24 ENCOUNTER — Encounter (HOSPITAL_COMMUNITY): Payer: Self-pay | Admitting: Emergency Medicine

## 2023-11-24 LAB — DIFFERENTIAL
Abs Immature Granulocytes: 0.01 10*3/uL (ref 0.00–0.07)
Basophils Absolute: 0.1 10*3/uL (ref 0.0–0.1)
Basophils Relative: 2 %
Eosinophils Absolute: 0.4 10*3/uL (ref 0.0–0.5)
Eosinophils Relative: 8 %
Immature Granulocytes: 0 %
Lymphocytes Relative: 30 %
Lymphs Abs: 1.7 10*3/uL (ref 0.7–4.0)
Monocytes Absolute: 0.5 10*3/uL (ref 0.1–1.0)
Monocytes Relative: 9 %
Neutro Abs: 2.8 10*3/uL (ref 1.7–7.7)
Neutrophils Relative %: 51 %

## 2023-11-24 LAB — CBC
HCT: 41.5 % (ref 39.0–52.0)
Hemoglobin: 14 g/dL (ref 13.0–17.0)
MCH: 32.7 pg (ref 26.0–34.0)
MCHC: 33.7 g/dL (ref 30.0–36.0)
MCV: 97 fL (ref 80.0–100.0)
Platelets: 182 10*3/uL (ref 150–400)
RBC: 4.28 MIL/uL (ref 4.22–5.81)
RDW: 12.4 % (ref 11.5–15.5)
WBC: 5.5 10*3/uL (ref 4.0–10.5)
nRBC: 0 % (ref 0.0–0.2)

## 2023-11-24 LAB — URINALYSIS, ROUTINE W REFLEX MICROSCOPIC
Bacteria, UA: NONE SEEN
Bilirubin Urine: NEGATIVE
Glucose, UA: NEGATIVE mg/dL
Hgb urine dipstick: NEGATIVE
Ketones, ur: NEGATIVE mg/dL
Nitrite: NEGATIVE
Protein, ur: NEGATIVE mg/dL
Specific Gravity, Urine: 1.02 (ref 1.005–1.030)
pH: 7 (ref 5.0–8.0)

## 2023-11-24 LAB — I-STAT CHEM 8, ED
BUN: 19 mg/dL (ref 8–23)
Calcium, Ion: 1.21 mmol/L (ref 1.15–1.40)
Chloride: 95 mmol/L — ABNORMAL LOW (ref 98–111)
Creatinine, Ser: 1.6 mg/dL — ABNORMAL HIGH (ref 0.61–1.24)
Glucose, Bld: 103 mg/dL — ABNORMAL HIGH (ref 70–99)
HCT: 44 % (ref 39.0–52.0)
Hemoglobin: 15 g/dL (ref 13.0–17.0)
Potassium: 3.3 mmol/L — ABNORMAL LOW (ref 3.5–5.1)
Sodium: 139 mmol/L (ref 135–145)
TCO2: 34 mmol/L — ABNORMAL HIGH (ref 22–32)

## 2023-11-24 LAB — RAPID URINE DRUG SCREEN, HOSP PERFORMED
Amphetamines: NOT DETECTED
Barbiturates: NOT DETECTED
Benzodiazepines: NOT DETECTED
Cocaine: NOT DETECTED
Opiates: NOT DETECTED
Tetrahydrocannabinol: NOT DETECTED

## 2023-11-24 LAB — COMPREHENSIVE METABOLIC PANEL WITH GFR
ALT: 9 U/L (ref 0–44)
AST: 24 U/L (ref 15–41)
Albumin: 3.8 g/dL (ref 3.5–5.0)
Alkaline Phosphatase: 50 U/L (ref 38–126)
Anion gap: 11 (ref 5–15)
BUN: 18 mg/dL (ref 8–23)
CO2: 30 mmol/L (ref 22–32)
Calcium: 9.5 mg/dL (ref 8.9–10.3)
Chloride: 97 mmol/L — ABNORMAL LOW (ref 98–111)
Creatinine, Ser: 1.51 mg/dL — ABNORMAL HIGH (ref 0.61–1.24)
GFR, Estimated: 44 mL/min — ABNORMAL LOW (ref >60)
Glucose, Bld: 110 mg/dL — ABNORMAL HIGH (ref 70–99)
Potassium: 3.7 mmol/L (ref 3.5–5.1)
Sodium: 138 mmol/L (ref 135–145)
Total Bilirubin: 0.9 mg/dL (ref 0.0–1.2)
Total Protein: 5.8 g/dL — ABNORMAL LOW (ref 6.5–8.1)

## 2023-11-24 LAB — PROTIME-INR
INR: 1 (ref 0.8–1.2)
Prothrombin Time: 13.3 s (ref 11.4–15.2)

## 2023-11-24 LAB — APTT: aPTT: 26 s (ref 24–36)

## 2023-11-24 LAB — ETHANOL: Alcohol, Ethyl (B): <10 mg/dL (ref <10)

## 2023-11-24 MED ORDER — IOHEXOL 350 MG/ML SOLN
75.0000 mL | Freq: Once | INTRAVENOUS | Status: AC | PRN
  Administered 2023-11-24: 75 mL via INTRAVENOUS

## 2023-11-24 MED ORDER — SODIUM CHLORIDE 0.9 % IV BOLUS (SEPSIS)
1000.0000 mL | Freq: Once | INTRAVENOUS | Status: AC
  Administered 2023-11-24: 1000 mL via INTRAVENOUS

## 2023-11-24 NOTE — Discharge Instructions (Signed)
   RETURN IMMEDIATELY IF  you develop new shortness of breath, chest pain, fever, have difficulty moving parts of your body (new weakness, numbness, or incoordination), sudden change in speech, vision, swallowing, or understanding, faint or develop new dizziness, severe headache, become poorly responsive or have an altered mental status compared to baseline for you, new rash, abdominal pain, or bloody stools,  Return sooner also if you develop new problems for which you have not talked to your caregiver but you feel may be emergency medical conditions.

## 2023-11-24 NOTE — ED Notes (Signed)
 MD Wickline aware of patient's elevated BP, deemed patient safe for discharge. Pt is at baseline mental status and not complaining of HA or dizziness or vision changes.

## 2023-11-24 NOTE — Code Documentation (Signed)
 Stroke Response Nurse Documentation Code Documentation  Samuel Sloan is a 89 y.o. male arriving to [facility]  via [facility] on 4/6 with past medical hx of PE, OSA, HTN, BPH, GERD. On No antithrombotic. Code stroke was activated by EMS.   Patient from SNF where he was LKW at 2000 and now complaining of slurred speech after being found of the floor.   Stroke team at the bedside on patient arrival. Labs drawn and patient cleared for CT by Dr. Bebe Shaggy. Patient to CT with team. NIHSS 14, see documentation for details and code stroke times. Patient with decreased LOC, disoriented, bilateral arm weakness, bilateral leg weakness, Expressive aphasia , and dysarthria  on exam. The following imaging was completed:  CT Head and CTA. Patient is not a candidate for IV Thrombolytic due to nonfocal exam. Patient is not a candidate for IR due to No LVO.     Bedside handoff with ED RN Candace.    Rose Fillers  Rapid Response RN

## 2023-11-24 NOTE — ED Notes (Signed)
[facility] called at this time for transport back to facility. 
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
## 2023-11-24 NOTE — ED Triage Notes (Signed)
 Pt BIB EMS from [facility].  Staff saw pt LKW at 2000 when giving meds.  Found on floor after that reporting he fell from bed.  Small lac to head.  No thinners.  Pt was noted to have slurred speech.  Code stroke activated by EMS. Bilateral weakness.  FIre initial BP was 150/90, EMS initial BP 214/124.  HR 50s at baseline.  Pt has dementia at baseline but is normally able to carry on conversation with staff.
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
## 2023-12-08 ENCOUNTER — Other Ambulatory Visit: Payer: Self-pay

## 2023-12-08 ENCOUNTER — Emergency Department (HOSPITAL_COMMUNITY)

## 2023-12-08 ENCOUNTER — Emergency Department (HOSPITAL_COMMUNITY)
Admission: EM | Admit: 2023-12-08 | Discharge: 2023-12-08 | Disposition: A | Discharge status: Home / Self Care | Attending: Emergency Medicine | Admitting: Emergency Medicine

## 2023-12-08 DIAGNOSIS — Z79899 Other long term (current) drug therapy: Secondary | ICD-10-CM | POA: Diagnosis not present

## 2023-12-08 DIAGNOSIS — Z7982 Long term (current) use of aspirin: Secondary | ICD-10-CM | POA: Diagnosis not present

## 2023-12-08 DIAGNOSIS — R4182 Altered mental status, unspecified: Secondary | ICD-10-CM | POA: Diagnosis not present

## 2023-12-08 DIAGNOSIS — F0390 Unspecified dementia without behavioral disturbance: Secondary | ICD-10-CM | POA: Insufficient documentation

## 2023-12-08 DIAGNOSIS — R531 Weakness: Secondary | ICD-10-CM | POA: Diagnosis present

## 2023-12-08 DIAGNOSIS — I10 Essential (primary) hypertension: Secondary | ICD-10-CM | POA: Diagnosis not present

## 2023-12-08 LAB — URINALYSIS, W/ REFLEX TO CULTURE (INFECTION SUSPECTED)
Bacteria, UA: NONE SEEN
Bilirubin Urine: NEGATIVE
Glucose, UA: NEGATIVE mg/dL
Ketones, ur: NEGATIVE mg/dL
Leukocytes,Ua: NEGATIVE
Nitrite: NEGATIVE
Protein, ur: NEGATIVE mg/dL
Specific Gravity, Urine: 1.019 (ref 1.005–1.030)
pH: 6 (ref 5.0–8.0)

## 2023-12-08 LAB — BASIC METABOLIC PANEL WITH GFR
Anion gap: 7 (ref 5–15)
BUN: 20 mg/dL (ref 8–23)
CO2: 30 mmol/L (ref 22–32)
Calcium: 9.2 mg/dL (ref 8.9–10.3)
Chloride: 101 mmol/L (ref 98–111)
Creatinine, Ser: 1.36 mg/dL — ABNORMAL HIGH (ref 0.61–1.24)
GFR, Estimated: 50 mL/min — ABNORMAL LOW (ref >60)
Glucose, Bld: 99 mg/dL (ref 70–99)
Potassium: 3.7 mmol/L (ref 3.5–5.1)
Sodium: 138 mmol/L (ref 135–145)

## 2023-12-08 LAB — CBC WITH DIFFERENTIAL/PLATELET
Abs Immature Granulocytes: 0.01 10*3/uL (ref 0.00–0.07)
Basophils Absolute: 0.1 10*3/uL (ref 0.0–0.1)
Basophils Relative: 1 %
Eosinophils Absolute: 0.2 10*3/uL (ref 0.0–0.5)
Eosinophils Relative: 4 %
HCT: 39.6 % (ref 39.0–52.0)
Hemoglobin: 13.1 g/dL (ref 13.0–17.0)
Immature Granulocytes: 0 %
Lymphocytes Relative: 27 %
Lymphs Abs: 1.2 10*3/uL (ref 0.7–4.0)
MCH: 31.9 pg (ref 26.0–34.0)
MCHC: 33.1 g/dL (ref 30.0–36.0)
MCV: 96.4 fL (ref 80.0–100.0)
Monocytes Absolute: 0.4 10*3/uL (ref 0.1–1.0)
Monocytes Relative: 10 %
Neutro Abs: 2.5 10*3/uL (ref 1.7–7.7)
Neutrophils Relative %: 58 %
Platelets: 174 10*3/uL (ref 150–400)
RBC: 4.11 MIL/uL — ABNORMAL LOW (ref 4.22–5.81)
RDW: 13 % (ref 11.5–15.5)
WBC: 4.3 10*3/uL (ref 4.0–10.5)
nRBC: 0 % (ref 0.0–0.2)

## 2023-12-08 LAB — AMMONIA: Ammonia: 31 umol/L (ref 9–35)

## 2023-12-08 LAB — ETHANOL: Alcohol, Ethyl (B): <10 mg/dL (ref <10)

## 2023-12-08 LAB — SALICYLATE LEVEL: Salicylate Lvl: <7 mg/dL — ABNORMAL LOW (ref 7.0–30.0)

## 2023-12-08 LAB — ACETAMINOPHEN LEVEL: Acetaminophen (Tylenol), Serum: 11 ug/mL (ref 10–30)

## 2023-12-08 LAB — CBG MONITORING, ED: Glucose-Capillary: 98 mg/dL (ref 70–99)

## 2023-12-08 LAB — MAGNESIUM: Magnesium: 1.9 mg/dL (ref 1.7–2.4)

## 2023-12-08 NOTE — Discharge Instructions (Addendum)
 Your workup was reassuring.  CT scan, chest x-ray did not show any concerning findings.  Urine without evidence of infection.  Follow-up with your neurologist.  Return for any concerning symptoms.

## 2023-12-08 NOTE — ED Notes (Signed)
 Called [facility]

## 2023-12-08 NOTE — ED Triage Notes (Addendum)
 Pt bib [facility] for weakness started this am. Hx of multiple falls.  Blister under left arm and chest been bit by bugs recently as well
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
## 2024-02-06 ENCOUNTER — Encounter (HOSPITAL_COMMUNITY): Payer: Self-pay | Admitting: *Deleted

## 2024-02-06 ENCOUNTER — Other Ambulatory Visit: Payer: Self-pay

## 2024-02-06 ENCOUNTER — Emergency Department (HOSPITAL_COMMUNITY)
Admission: EM | Admit: 2024-02-06 | Discharge: 2024-02-06 | Disposition: A | Discharge status: Skilled Nursing Facility | Attending: Emergency Medicine | Admitting: Emergency Medicine

## 2024-02-06 DIAGNOSIS — R21 Rash and other nonspecific skin eruption: Secondary | ICD-10-CM | POA: Diagnosis not present

## 2024-02-06 DIAGNOSIS — Z7982 Long term (current) use of aspirin: Secondary | ICD-10-CM | POA: Insufficient documentation

## 2024-02-06 DIAGNOSIS — Z79899 Other long term (current) drug therapy: Secondary | ICD-10-CM | POA: Diagnosis not present

## 2024-02-06 DIAGNOSIS — Z515 Encounter for palliative care: Secondary | ICD-10-CM

## 2024-02-06 DIAGNOSIS — R410 Disorientation, unspecified: Secondary | ICD-10-CM | POA: Insufficient documentation

## 2024-02-06 DIAGNOSIS — Z79891 Long term (current) use of opiate analgesic: Secondary | ICD-10-CM | POA: Diagnosis not present

## 2024-02-06 DIAGNOSIS — R4589 Other symptoms and signs involving emotional state: Secondary | ICD-10-CM

## 2024-02-06 DIAGNOSIS — I10 Essential (primary) hypertension: Secondary | ICD-10-CM | POA: Diagnosis not present

## 2024-02-06 DIAGNOSIS — Z7189 Other specified counseling: Secondary | ICD-10-CM | POA: Diagnosis not present

## 2024-02-06 DIAGNOSIS — Z66 Do not resuscitate: Secondary | ICD-10-CM | POA: Diagnosis not present

## 2024-02-06 DIAGNOSIS — R5383 Other fatigue: Secondary | ICD-10-CM | POA: Diagnosis not present

## 2024-02-06 DIAGNOSIS — R52 Pain, unspecified: Secondary | ICD-10-CM | POA: Diagnosis not present

## 2024-02-06 MED ORDER — MORPHINE SULFATE (PF) 4 MG/ML IV SOLN
4.0000 mg | Freq: Once | INTRAVENOUS | Status: AC
  Administered 2024-02-06: 4 mg via INTRAVENOUS
  Filled 2024-02-06: qty 1

## 2024-02-06 MED ORDER — CARMEX CLASSIC LIP BALM EX OINT
TOPICAL_OINTMENT | Freq: Once | CUTANEOUS | Status: AC
  Filled 2024-02-06: qty 10

## 2024-02-06 MED ORDER — POLYVINYL ALCOHOL 1.4 % OP SOLN: 1.0000 [drp] | Freq: Four times a day (QID) | OPHTHALMIC | Status: DC | PRN

## 2024-02-06 MED ORDER — SODIUM CHLORIDE 0.9% FLUSH
3.0000 mL | Freq: Two times a day (BID) | INTRAVENOUS | Status: DC
  Administered 2024-02-06: 10 mL via INTRAVENOUS

## 2024-02-06 MED ORDER — LORAZEPAM 2 MG/ML IJ SOLN
1.0000 mg | INTRAMUSCULAR | Status: DC | PRN
  Administered 2024-02-06: 1 mg via INTRAVENOUS
  Filled 2024-02-06: qty 1

## 2024-02-06 MED ORDER — GLYCOPYRROLATE 0.2 MG/ML IJ SOLN: 0.2000 mg | INTRAMUSCULAR | Status: DC | PRN

## 2024-02-06 MED ORDER — GLYCOPYRROLATE 0.2 MG/ML IJ SOLN
0.2000 mg | INTRAMUSCULAR | Status: DC | PRN
Start: 2024-02-06 — End: 2024-02-06

## 2024-02-06 MED ORDER — ONDANSETRON HCL 4 MG/2ML IJ SOLN
4.0000 mg | Freq: Once | INTRAMUSCULAR | Status: AC
  Administered 2024-02-06: 4 mg via INTRAVENOUS
  Filled 2024-02-06: qty 2

## 2024-02-06 MED ORDER — MORPHINE SULFATE (PF) 2 MG/ML IV SOLN
2.0000 mg | INTRAVENOUS | Status: DC | PRN
  Administered 2024-02-06 (×3): 4 mg via INTRAVENOUS
  Administered 2024-02-06 (×2): 2 mg via INTRAVENOUS
  Filled 2024-02-06: qty 2
  Filled 2024-02-06: qty 1
  Filled 2024-02-06: qty 2
  Filled 2024-02-06: qty 1
  Filled 2024-02-06: qty 2

## 2024-02-06 MED ORDER — GLYCOPYRROLATE 1 MG PO TABS
1.0000 mg | ORAL_TABLET | ORAL | Status: DC | PRN
Start: 2024-02-06 — End: 2024-02-06

## 2024-02-06 MED ORDER — SODIUM CHLORIDE 0.9% FLUSH: 3.0000 mL | INTRAVENOUS | Status: DC | PRN

## 2024-02-06 NOTE — Consult Note (Signed)
 Palliative Care Consult Note                                  Date: 02/06/2024   Patient Name: Samuel Sloan  DOB: 26-Feb-1934  MRN: [id number]  Age / Sex: 89 y.o., male  PCP: Lazoff, Shawn P, DO Referring Physician: Mannie Fairy DASEN, DO  Reason for Consultation: Establishing goals of care  HPI/Patient Profile: 89 y.o. 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  He was recently discharged from [facility], and was supposed to be receiving comfort care through [facility].  Palliative Medicine has been consulted for goals of care discussions. Patient and family are faced with anticipatory care needs and complex medical decision making.   Clinical Assessment and Goals of Care:   I have reviewed medical records including EPIC notes, labs and imaging. Discussed with EDP Dr. Doretha.   Assessed the patient at bedside. He currently appears comfortable. He is unresponsive to voice and light touch. No non-verbal signs of pain or discomfort noted. Respirations are even and unlabored. No excessive respiratory secretions noted. He recently received prn IV morphine .  Family present at bedside - daughter and significant other. I introduced Palliative Medicine as specialized medical care for people living with serious illness.  Created space and opportunity for family to express thoughts and feelings regarding current medical situation. Values and goals of care were attempted to be elicited.  We discussed patient's current illness and what it means in the larger context of his ongoing co-morbidities. Current clinical status was reviewed.   We reviewed plan for comfort care measures only, allowing the natural course to occur. Family states  they do not want patient to be in any pain, and agree with continued aggressive symptom management with IV morphine . Will also add IV ativan .   Reviewed plan to transition to hospice facility when a bed is available. Education and counseling provided on natural trajectory at EOL. Emotional support provided.    Review of Systems  Unable to perform ROS   Objective:   Primary Diagnoses: Present on Admission: **None**   Physical Exam Vitals reviewed.  Constitutional:      General: He is not in acute distress.    Appearance: He is ill-appearing.  Pulmonary:     Effort: No respiratory distress.   Neurological:     Mental Status: He is unresponsive.     Palliative Assessment/Data: PPS 10%     Assessment & Plan:   SUMMARY OF RECOMMENDATIONS   Continue comfort focused care Continue IV morphine  2-4 mg every 30 minutes as needed for pain  [facility] when [facility] PMT will continue to follow and support  Symptom Management:  Morphine  prn for pain or dyspnea Lorazepam  (ATIVAN ) prn for anxiety Haloperidol  (HALDOL ) prn for agitation  Glycopyrrolate  (ROBINUL ) for excessive secretions Ondansetron  (ZOFRAN ) prn for nausea Polyvinyl alcohol  (LIQUIFILM TEARS) prn for dry eyes Antiseptic oral rinse (BIOTENE) prn for dry mouth   Primary Decision Maker: HCPOA - daughter/[name]  Existing Vynca/ACP Documentation: HCPOA document naming [name] Honour as primary healthcare agent and Alm Honour as alternate  Code Status/Advance Care Planning: DNR   Prognosis:  < 2 weeks  Discharge Planning:  Hospice facility    Thank you for  allowing us  to participate in the care of [facility]   Billing based on MDM: High  Problems Addressed: One or more chronic illnesses with severe exacerbation, progression, or side effects of treatment.  Amount and/or Complexity of Data: Category 1:Review of prior external note(s) from each unique source, Review of  the result(s) of each unique test, and Assessment requiring an independent historian(s)  Risks: Parenteral controlled substances   Signed by: Recardo Loll, NP Palliative Medicine Team  Team Phone # [phone]  For individual providers, please see AMION

## 2024-02-06 NOTE — ED Triage Notes (Signed)
 Pt is resident at [facility] where he was going to receive hospice care but they wanted to use oral medication and pt is no longer able to swallow.  Facility was not able to manage changing him. Pt daughter would like him to receive inpatient hospice care, possibly at [facility] place.  Pt had NRB in place.  HOH per daughter.

## 2024-02-06 NOTE — ED Provider Notes (Signed)
 [place] EMERGENCY DEPARTMENT AT [facility] Provider Note   CSN: [id number] Arrival date & time: 02/06/24  1352     Patient presents with: Hospice   Samuel Sloan is a 89 y.o. male.   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  Patient was supposed to be receiving comfort care by [facility].  Family reports that patient was okay yesterday but woke up today confused and breathing rapidly.  He was unable to hear and he could not take the morphine orally after hours of coaxing they got a little bit of morphine in him but then he gagged and vomited some of it.  They were unable to start an IV.  They report they had to fire the hospice care and if they were care would be able to see them on Monday.  Because they did not feel that their family member was getting appropriate comfort measures they had him transported to the emergency room.  They do not desire for him to have any diagnostic testing or blood drawn.  They want only comfort measures.  IV fluids were stopped as they only want him to have it if he needs it with pain medication.  The history is provided by the EMS personnel, a relative and medical records.       Prior to Admission medications   Medication Sig Start Date End Date Taking? Authorizing Provider  acetaminophen  (TYLENOL ) 650 MG CR tablet Take 1,300 mg by mouth in the morning and at bedtime.    [provider]  aspirin  EC 81 MG tablet Take 1 tablet (81 mg total) by mouth in the morning. 08/25/23   Krishnan, Gokul, MD  BELSOMRA  10 MG TABS Take 1 tablet (10 mg total) by mouth at bedtime. 08/25/23   Krishnan, Gokul, MD  buPROPion  (WELLBUTRIN  XL) 150 MG 24 hr tablet Take 1  tablet (150 mg total) by mouth daily. Patient not taking: Reported on 12/08/2023 08/26/23   Krishnan, Gokul, MD  buPROPion  (WELLBUTRIN  XL) 300 MG 24 hr tablet Take 300 mg by mouth daily. 11/03/23   [provider]  CHLORASEPTIC MOUTH PAIN 1.4 % LIQD Use as directed 1 spray in the mouth or throat as needed for throat irritation / pain.    [provider]  clobetasol cream (TEMOVATE) 0.05 % Apply 1 Application topically See admin instructions. Apply to affected area at 8 AM and 8 PM 11/14/23   [provider]  clonazePAM  (KLONOPIN ) 0.5 MG tablet Take 1 tablet (0.5 mg total) by mouth See admin instructions. Take 0.5mg  by mouth in the morning and evening. May take an additional dose around midday if needed for anxiety. Patient taking differently: Take 0.5 mg by mouth in the morning and at bedtime. 08/25/23   Krishnan, Gokul, MD  clotrimazole  (LOTRIMIN ) 1 % cream Apply 1 Application topically 2 (two) times daily. Apply to the head of the penis twice a day for 2-4 weeks. Patient not taking: Reported on 12/08/2023 08/25/23   Krishnan, Gokul, MD  FIBER PO Take 1-2 g by mouth See admin instructions. Benefiber 1 gram chewable tablets: Chew 1-2 grams by mouth once a day as needed for constipation    [provider]  finasteride  (PROSCAR ) 5 MG tablet Take 1  tablet (5 mg total) by mouth in the morning. 08/25/23   Krishnan, Gokul, MD  lipase/protease/amylase (CREON ) 36000 UNITS CPEP capsule Take 1-2 capsules (36,000-72,000 Units total) by mouth 3 (three) times daily with meals. Take 72,000 units by mouth with meals. Take 36,000 units by mouth with snacks as needed. Patient taking differently: Take 72,000 Units by mouth 3 (three) times daily with meals. 08/25/23   Krishnan, Gokul, MD  loperamide  (IMODIUM ) 2 MG capsule Take 2 mg by mouth 4 (four) times daily as needed for diarrhea or loose stools.    [provider]  meclizine  (ANTIVERT ) 25 MG tablet Take 1 tablet (25 mg total) by mouth  3 (three) times daily as needed for dizziness or nausea. 08/25/23   Krishnan, Gokul, MD  melatonin 5 MG TABS Take 1 tablet (5 mg total) by mouth at bedtime. 08/25/23   Krishnan, Gokul, MD  meloxicam  (MOBIC ) 7.5 MG tablet Take 1 tablet (7.5 mg total) by mouth in the morning and at bedtime. 08/25/23   Krishnan, Gokul, MD  Multiple Vitamin (MULTIVITAMIN) tablet Take 1 tablet by mouth in the morning. PureOne Patient taking differently: Take 1 tablet by mouth in the morning. 08/25/23   Krishnan, Gokul, MD  omeprazole  (PRILOSEC) 20 MG capsule Take 1 capsule (20 mg total) by mouth daily before breakfast. Patient not taking: Reported on 12/08/2023 08/25/23   Krishnan, Gokul, MD  pantoprazole  (PROTONIX ) 40 MG tablet Take 40 mg by mouth daily before breakfast. 10/13/23   [provider]  polyethylene glycol (MIRALAX  / GLYCOLAX ) 17 g packet Take 17 g by mouth daily. 08/26/23   Krishnan, Gokul, MD  pramipexole  (MIRAPEX ) 1 MG tablet Take 1 tablet (1 mg total) by mouth at bedtime. 08/25/23   Krishnan, Gokul, MD  Probiotic Product (PROBIOTIC PO) Take 1.5 mg by mouth in the morning.    [provider]  risperiDONE  (RISPERDAL ) 0.5 MG tablet Take 1 tablet (0.5 mg total) by mouth at bedtime. 08/25/23   Krishnan, Gokul, MD  tamsulosin  (FLOMAX ) 0.4 MG CAPS capsule Take 1 capsule (0.4 mg total) by mouth in the morning. 08/25/23   Krishnan, Gokul, MD  traZODone  (DESYREL ) 100 MG tablet Take 1 tablet (100 mg total) by mouth at bedtime. 08/25/23   Krishnan, Gokul, MD    Allergies: Verapamil, Amlodipine, Ibuprofen, Oxycodone, Toprol xl  [metoprolol], and Lamotrigine     Review of Systems  Updated Vital Signs BP (!) 79/58   Temp 99 F (37.2 C) (Axillary)   Resp (!) 24   Physical Exam Vitals and nursing note reviewed.  Constitutional:      Appearance: He is well-developed. He is ill-appearing.  HENT:     Head: Normocephalic and atraumatic.     Mouth/Throat:     Mouth: Mucous membranes are dry.   Eyes:      Conjunctiva/sclera: Conjunctivae normal.     Pupils: Pupils are equal, round, and reactive to light.    Cardiovascular:     Rate and Rhythm: Normal rate and regular rhythm.     Heart sounds: No murmur heard. Pulmonary:     Effort: Pulmonary effort is normal. No respiratory distress.     Comments: Decreased breath sounds throughout Abdominal:     General: There is no distension.     Palpations: Abdomen is soft.     Tenderness: There is no abdominal tenderness. There is no guarding or rebound.   Musculoskeletal:        General: No tenderness. Normal range of motion.  Cervical back: Normal range of motion and neck supple.     Right lower leg: No edema.     Left lower leg: No edema.   Skin:    General: Skin is warm and dry.     Findings: Rash present. No erythema.     Comments: Lesions present diffusely over the body that are circular, raised and open with minimal drainage   Neurological:     Mental Status: He is lethargic.     Comments: Patient will occasionally open his eyes and is noted to move his extremities but cannot follow commands    (all labs ordered are listed, but only abnormal results are displayed) Labs Reviewed - No data to display  EKG: None  Radiology: No results found.   Procedures   Medications Ordered in the ED  glycopyrrolate (ROBINUL) tablet 1 mg (has no administration in time range)    Or  glycopyrrolate (ROBINUL) injection 0.2 mg (has no administration in time range)    Or  glycopyrrolate (ROBINUL) injection 0.2 mg (has no administration in time range)  artificial tears ophthalmic solution 1 drop (has no administration in time range)  sodium chloride  flush (NS) 0.9 % injection 3-10 mL (10 mLs Intravenous Given 02/06/24 1532)  sodium chloride  flush (NS) 0.9 % injection 3-10 mL (has no administration in time range)  morphine (PF) 2 MG/ML injection 2-4 mg (4 mg Intravenous Given 02/06/24 1640)  morphine (PF) 4 MG/ML injection 4 mg (4 mg  Intravenous Given 02/06/24 1428)  ondansetron  (ZOFRAN ) injection 4 mg (4 mg Intravenous Given 02/06/24 1429)  lip balm (CARMEX) ointment ( Topical Given 02/06/24 1557)                                    Medical Decision Making Risk OTC drugs. Prescription drug management.   Patient presenting today for end-of-life care.  Family at this time are requesting only comfort measures.  Patient recently hospitalized and discharged 2 days ago.  Having shortness of breath and chest pain this morning.  Currently is on a nonrebreather and was given some IV morphine but does not appear significantly uncomfortable at this time.  Consulted palliative care/hospice.  Family desires that patient go to [facility].  Consulted TOC authoricare will be coming.    Comfort care order set initiated.  Pt receiving morphine but will give haldol  as needed.  Hospice saw the pt and pt is accepted at [facility].  Will d/c to there which family is comfortable with.   CRITICAL CARE Performed by: Leonarda Leis Total critical care time: 30 minutes Critical care time was exclusive of separately billable procedures and treating other patients. Critical care was necessary to treat or prevent imminent or life-threatening deterioration. Critical care was time spent personally by me on the following activities: development of treatment plan with patient and/or surrogate as well as nursing, discussions with consultants, evaluation of patient's response to treatment, examination of patient, obtaining history from patient or surrogate, ordering and performing treatments and interventions, ordering and review of laboratory studies, ordering and review of radiographic studies, pulse oximetry and re-evaluation of patient's condition.      Final diagnoses:  End of life care  Inadequate pain control    ED Discharge Orders     None          Almond Army, MD 02/06/24 1655

## 2024-02-06 NOTE — ED Notes (Signed)
 Pt was in soiled depend with large amount of soft stool.  Family states that he was soiled PTA and staff was unable to manage changing him.  Pt changed out of his clothing, changed him into gown and placed in depend after cleaning him with warm washcloths and periwash.  Patted dry and placed barrier cream for comfort.  Pt repositoned

## 2024-02-06 NOTE — Discharge Planning (Signed)
 RNCM consulted regarding discharge planning for [facility].  RNCM contacted Arsenio Larger, RN liaison for [facility] to review pt for residential hospice placement. Heey will visit pt and family today (probably closer to 4-4:30).   Rosealie Reach J. Rachel Budds, BSN, RN, NCM  Transitions of Care  Nurse Case Manager  [facility] Emergency Departments  Operative Services  [phone]

## 2024-02-17 DEATH — deceased
